# Patient Record
Sex: Male | Born: 1950 | Race: White | Hispanic: No | Marital: Married | Smoking: Former smoker
Health system: Southern US, Community
[De-identification: ages and names within clinical notes are randomized; demographics above are authoritative.]

## PROBLEM LIST (undated history)

## (undated) DIAGNOSIS — C801 Malignant (primary) neoplasm, unspecified: Secondary | ICD-10-CM

## (undated) DIAGNOSIS — I890 Lymphedema, not elsewhere classified: Secondary | ICD-10-CM

## (undated) DIAGNOSIS — I10 Essential (primary) hypertension: Secondary | ICD-10-CM

## (undated) DIAGNOSIS — F32A Depression, unspecified: Secondary | ICD-10-CM

## (undated) DIAGNOSIS — Z9221 Personal history of antineoplastic chemotherapy: Secondary | ICD-10-CM

## (undated) DIAGNOSIS — E46 Unspecified protein-calorie malnutrition: Secondary | ICD-10-CM

## (undated) DIAGNOSIS — IMO0001 Reserved for inherently not codable concepts without codable children: Secondary | ICD-10-CM

## (undated) DIAGNOSIS — Z8679 Personal history of other diseases of the circulatory system: Secondary | ICD-10-CM

## (undated) DIAGNOSIS — C099 Malignant neoplasm of tonsil, unspecified: Secondary | ICD-10-CM

## (undated) DIAGNOSIS — Z931 Gastrostomy status: Secondary | ICD-10-CM

## (undated) DIAGNOSIS — I82403 Acute embolism and thrombosis of unspecified deep veins of lower extremity, bilateral: Secondary | ICD-10-CM

## (undated) DIAGNOSIS — K219 Gastro-esophageal reflux disease without esophagitis: Secondary | ICD-10-CM

## (undated) DIAGNOSIS — F329 Major depressive disorder, single episode, unspecified: Secondary | ICD-10-CM

## (undated) DIAGNOSIS — IMO0002 Reserved for concepts with insufficient information to code with codable children: Secondary | ICD-10-CM

## (undated) DIAGNOSIS — Z923 Personal history of irradiation: Secondary | ICD-10-CM

## (undated) HISTORY — PX: TONSILLECTOMY: SUR1361

## (undated) HISTORY — PX: PORT-A-CATH REMOVAL: SHX5289

## (undated) HISTORY — PX: OTHER SURGICAL HISTORY: SHX169

## (undated) HISTORY — DX: Malignant neoplasm of tonsil, unspecified: C09.9

## (undated) HISTORY — DX: Gastro-esophageal reflux disease without esophagitis: K21.9

## (undated) HISTORY — DX: Unspecified protein-calorie malnutrition: E46

## (undated) HISTORY — PX: SPLENECTOMY: SUR1306

---

## 2009-04-01 HISTORY — PX: OTHER SURGICAL HISTORY: SHX169

## 2009-09-25 ENCOUNTER — Ambulatory Visit: Admission: RE | Admit: 2009-09-25 | Discharge: 2009-11-02 | Payer: Self-pay | Admitting: Radiation Oncology

## 2009-10-03 ENCOUNTER — Ambulatory Visit: Payer: Self-pay | Admitting: Dentistry

## 2009-10-03 ENCOUNTER — Encounter: Admission: AD | Admit: 2009-10-03 | Discharge: 2009-10-03 | Payer: Self-pay | Admitting: Dentistry

## 2009-10-04 ENCOUNTER — Ambulatory Visit: Payer: Self-pay | Admitting: Dentistry

## 2010-04-22 ENCOUNTER — Encounter: Payer: Self-pay | Admitting: Radiation Oncology

## 2011-08-05 ENCOUNTER — Ambulatory Visit (HOSPITAL_COMMUNITY)
Admission: RE | Admit: 2011-08-05 | Discharge: 2011-08-05 | Disposition: A | Discharge status: Home / Self Care | Payer: 59 | Source: Ambulatory Visit | Attending: Family Medicine | Admitting: Family Medicine

## 2011-08-05 ENCOUNTER — Other Ambulatory Visit: Payer: Self-pay | Admitting: Family Medicine

## 2011-08-05 DIAGNOSIS — R51 Headache: Secondary | ICD-10-CM

## 2011-08-05 DIAGNOSIS — H532 Diplopia: Secondary | ICD-10-CM | POA: Insufficient documentation

## 2011-08-05 DIAGNOSIS — J392 Other diseases of pharynx: Secondary | ICD-10-CM | POA: Insufficient documentation

## 2011-08-05 DIAGNOSIS — Z85819 Personal history of malignant neoplasm of unspecified site of lip, oral cavity, and pharynx: Secondary | ICD-10-CM | POA: Insufficient documentation

## 2011-08-06 ENCOUNTER — Emergency Department (HOSPITAL_COMMUNITY)
Admission: EM | Admit: 2011-08-06 | Discharge: 2011-08-06 | Disposition: A | Discharge status: Home / Self Care | Payer: 59 | Attending: Emergency Medicine | Admitting: Emergency Medicine

## 2011-08-06 ENCOUNTER — Emergency Department (HOSPITAL_COMMUNITY): Payer: 59

## 2011-08-06 DIAGNOSIS — R0602 Shortness of breath: Secondary | ICD-10-CM | POA: Insufficient documentation

## 2011-08-06 DIAGNOSIS — Z85819 Personal history of malignant neoplasm of unspecified site of lip, oral cavity, and pharynx: Secondary | ICD-10-CM | POA: Insufficient documentation

## 2011-08-06 DIAGNOSIS — H532 Diplopia: Secondary | ICD-10-CM | POA: Insufficient documentation

## 2011-08-06 DIAGNOSIS — R55 Syncope and collapse: Secondary | ICD-10-CM | POA: Insufficient documentation

## 2011-08-06 DIAGNOSIS — R220 Localized swelling, mass and lump, head: Secondary | ICD-10-CM | POA: Insufficient documentation

## 2011-08-06 DIAGNOSIS — R51 Headache: Secondary | ICD-10-CM | POA: Insufficient documentation

## 2011-08-06 LAB — CBC
HCT: 42.7 % (ref 39.0–52.0)
Hemoglobin: 15.4 g/dL (ref 13.0–17.0)
MCV: 89 fL (ref 78.0–100.0)
RBC: 4.8 MIL/uL (ref 4.22–5.81)
WBC: 22.2 10*3/uL — ABNORMAL HIGH (ref 4.0–10.5)

## 2011-08-06 LAB — COMPREHENSIVE METABOLIC PANEL
ALT: 21 U/L (ref 0–53)
CO2: 22 mEq/L (ref 19–32)
Calcium: 10 mg/dL (ref 8.4–10.5)
Chloride: 102 mEq/L (ref 96–112)
Creatinine, Ser: 1.29 mg/dL (ref 0.50–1.35)
GFR calc Af Amer: 69 mL/min — ABNORMAL LOW (ref >90)
GFR calc non Af Amer: 59 mL/min — ABNORMAL LOW (ref >90)
Glucose, Bld: 76 mg/dL (ref 70–99)
Total Bilirubin: 0.9 mg/dL (ref 0.3–1.2)

## 2011-08-06 LAB — DIFFERENTIAL
Eosinophils Relative: 0 % (ref 0–5)
Lymphocytes Relative: 7 % — ABNORMAL LOW (ref 12–46)
Lymphs Abs: 1.5 10*3/uL (ref 0.7–4.0)
Monocytes Absolute: 1.3 10*3/uL — ABNORMAL HIGH (ref 0.1–1.0)

## 2011-08-06 LAB — URINALYSIS, ROUTINE W REFLEX MICROSCOPIC
Bilirubin Urine: NEGATIVE
Nitrite: NEGATIVE
Protein, ur: 100 mg/dL — AB
Specific Gravity, Urine: 1.019 (ref 1.005–1.030)
Urobilinogen, UA: 1 mg/dL (ref 0.0–1.0)

## 2011-08-06 LAB — POCT I-STAT TROPONIN I

## 2011-08-06 LAB — URINE MICROSCOPIC-ADD ON

## 2011-08-06 MED ORDER — SODIUM CHLORIDE 0.9 % IV SOLN
1000.0000 mL | Freq: Once | INTRAVENOUS | Status: AC
  Administered 2011-08-06: 1000 mL via INTRAVENOUS

## 2011-08-06 MED ORDER — SODIUM CHLORIDE 0.9 % IV SOLN: 1000.0000 mL | INTRAVENOUS | Status: DC

## 2011-08-06 NOTE — Discharge Instructions (Signed)
Followup with your doctor in regards to your hospital visit. You may return to the emergency department if symptoms worsen, become progressive, or become more concerning. Keep your follow up appointment with your oncologist on May 14th.

## 2011-08-06 NOTE — ED Provider Notes (Signed)
History     CSN: [id number]  Arrival date & time 08/06/11  1535   First MD Initiated Contact with Patient 08/06/11 1540      Chief Complaint  Patient presents with  . Loss of Consciousness    (Consider location/radiation/quality/duration/timing/severity/associated sxs/prior treatment) HPI Comments: Patient with a history of tonsillar carcinoma, hypertension, and hyperlipidemia presents emergency Department with a chief complaint of syncopal event.  Patient states he was having some SOB,  difficulty breathing, and the feeling that he "could not clear his throat" while at work when he suddenly became diaphoretic, nauseous, and dizzy.  There was an episode of witnessed syncope lasting only seconds, but there was no head trauma. Pt had an MRI done on 5/6 for 3 days of double vision and 3 mo of HA (performed at [facility]). Results showed a mass of the posterior nasopharynx extending to the carotid artery w question for invasion at skull base. 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 Hezzie Bump at [facility] . Biopsy of lesion is is scheduled for May 14th. Pt has no current complaints and is asymptomatic art this time.    Patient is a 61 y.o. male presenting with syncope. The history is provided by the patient.  Loss of Consciousness Associated symptoms include headaches. Pertinent negatives include no weakness.    No past medical history on file.  No past surgical history on file.  No family history on file.  History  Substance Use Topics  . Smoking status: Not on file  . Smokeless tobacco: Not on file  . Alcohol Use: Not on file      Review of Systems  Eyes: Positive for visual disturbance (double vision).  Cardiovascular: Positive for syncope.  Neurological: Positive for dizziness, light-headedness and headaches. Negative for tremors, syncope, speech difficulty and weakness.  All other systems reviewed and are  negative.    Allergies  Review of patient's allergies indicates no known allergies.  Home Medications   Current Outpatient Rx  Name Route Sig Dispense Refill  . LISINOPRIL 40 MG PO TABS Oral Take 40 mg by mouth daily.      There were no vitals taken for this visit.  Physical Exam  Nursing note and vitals reviewed. Constitutional: He is oriented to person, place, and time. He appears well-developed and well-nourished. No distress.  HENT:  Head: Normocephalic and atraumatic.  Eyes: Conjunctivae and EOM are normal. Pupils are equal, round, and reactive to light. No scleral icterus.  Neck: Normal range of motion and full passive range of motion without pain. Neck supple. No JVD present. Carotid bruit is not present. No rigidity. No Brudzinski's sign noted.  Cardiovascular: Normal rate, regular rhythm, normal heart sounds and intact distal pulses.   Pulmonary/Chest: Effort normal and breath sounds normal. No respiratory distress. He has no wheezes. He has no rales.  Musculoskeletal: Normal range of motion.  Lymphadenopathy:    He has no cervical adenopathy.  Neurological: He is alert and oriented to person, place, and time. He has normal strength. No cranial nerve deficit or sensory deficit. He displays a negative Romberg sign. Coordination and gait normal. GCS eye subscore is 4. GCS verbal subscore is 5. GCS motor subscore is 6.       A&O x3.  Able to follow commands. EOMs, no vertical  nystagmus. Shoulder shrug, facial muscles, tongue protrusion and swallow intact.  Motor strength 5/5 bilaterally including grip strength, triceps, hamstrings and ankle dorsiflexion.  Light touch intact  in all 4 distal limbs.  Intact finger to nose, shin to heel and rapid alternating movements. No ataxia or dysequilibrium.   Skin: Skin is warm and dry. No rash noted. He is not diaphoretic.  Psychiatric: He has a normal mood and affect. His behavior is normal.    ED Course  Procedures (including critical  care time)  Labs Reviewed - No data to display Dg Eye Foreign Body  08/05/2011  *RADIOLOGY REPORT*  Clinical Data: History of metal within eyes, MRI clearance  ORBITS FOR FOREIGN BODY - 2 VIEW  Comparison: None.  Findings: No orbital metallic foreign body. Nasal septal deviation to the right. Visualized bones and sinuses otherwise unremarkable.  IMPRESSION: No orbital metallic foreign body identified.  Original Report Authenticated By: Lollie Marrow, M.D.   Mr Brain Wo Contrast  08/05/2011  **ADDENDUM** CREATED: 08/05/2011 13:09:56  I discussed the findings with on CJ, PA  on 08/05/2011 at 1310 hours.  Additional history includes a history of   tonsillar carcinoma.  This may represent recurrent carcinoma.  Further imaging evaluation is recommended as described in the original report.  **END ADDENDUM** SIGNED BY: Dineen Kid. Chestine Spore, M.D.    08/05/2011  *RADIOLOGY REPORT*  Clinical Data: Sinus pressure and headache.  Double vision  MRI HEAD WITHOUT CONTRAST  Technique:  Multiplanar, multiecho pulse sequences of the brain and surrounding structures were obtained according to standard protocol without intravenous contrast.  Comparison: None.  Findings: Ventricle size is normal.  Mild cerebral atrophy is present.  Mild patchy hyperintensity in the frontal white matter bilaterally appears chronic and is most likely due to chronic ischemia.  Negative for acute infarct.  No intracranial hemorrhage or fluid collection.  Negative for intracranial mass lesion.  Soft tissue mass is present in the left  posterior nasopharynx, only partially evaluated on this study.  This mass measures approximately 2.1 x 3.9 cm and extends caudally around the left carotid artery which appears to remain patent.  There is also invasion of the left   longus coli muscle. There is possible invasion of the posterior left cavernous sinus and possible invasion of the clivus on the left.  There is a small mastoid sinus effusion on the left compatible with  eustachian tube obstruction.  IMPRESSION: Mass lesion in the left posterior nasopharynx extending caudally along the carotid artery.  This is most compatible with nasopharyngeal carcinoma.  There is concern about perineural invasion extending into the left cavernous sinus which may be causing the patient's double vision.  There is also concern about invasion of the skull base.  Further imaging evaluation is suggested.  I would recommend MRI of the skull base without and with contrast to evaluate for tumor extension and perineural tumor.  MRI neck without with contrast also suggested to evaluate for cervical adenopathy.  Original Report Authenticated By: Camelia Phenes, M.D.     No diagnosis found.   Date: 08/06/2011  Rate: 62  Rhythm: normal sinus rhythm  QRS Axis: normal  Intervals: normal  ST/T Wave abnormalities: normal  Conduction Disutrbances: none  Narrative Interpretation:   Old EKG Reviewed: No comparison  6:13 PM  As long as not bleeding and breathing OK Dr. Hezzie Bump does not believe syncopal episode to be related to mass.    MDM  Syncope question vasovagal   Patient has been advised to keep his followup appointment with Dr.Waltonen, and to see him sooner if difficulty breathing occurs.  Patient is aware of lab results as is his  oncologist. Patient case discussed with Dr. Jeraldine Loots who agrees with plan to dc. Pt rehydrated in the ED and has no signs of infection including being asymptomatic with VSS. Patient does not want to be admitted and will follow up with his PCP [facility]. Pt appears to be reliable for follow up.         Jaci Carrel, [place] 08/06/11 1932

## 2011-08-06 NOTE — ED Notes (Signed)
Per ems- pt had a synopal episode while at work. Pt became hot and "passed out" coworkers helped pt to floor. Pt was diaphoretic and SOB when EMS arrived. BP after receiving fluids 110/65. HR 55. IV 20 Rt forearm with approx of NS en route. CBG 83.

## 2011-08-06 NOTE — ED Notes (Signed)
Pt given urinal and encouraged to give urine sample

## 2011-08-07 NOTE — ED Provider Notes (Signed)
Medical screening examination/treatment/procedure(s) were conducted as a shared visit with non-physician practitioner(s) and myself.  I personally evaluated the patient during the encounter On my exam this patient who presented to do a syncopal event was in no distress, sitting upright, with no focal complaints.  Notably, the patient has a significant history of cancer, which is being evaluated for a recurrence.  A very recent MRI demonstrates progression of his disease.  The patient's emergency department evaluation was largely unremarkable.  We discussed patient's case with his oncologist at [facility].  Given the absence of focal findings here, the description of the event consistent with vagal syncope, and the patient's needed ongoing followup for oncologic therapy, he was discharged in stable condition to followup as soon as possible.  On my exam the patient had a sinus rhythm of 60 which is normal, and his pulse oximetry was 99% on room air which is normal.  I saw the ECG and agree with the interpretation.  Gerhard Munch, MD 08/07/11 [phone]

## 2011-08-28 ENCOUNTER — Emergency Department (HOSPITAL_COMMUNITY): Payer: 59

## 2011-08-28 ENCOUNTER — Other Ambulatory Visit: Payer: Self-pay | Admitting: Radiation Oncology

## 2011-08-28 ENCOUNTER — Encounter (HOSPITAL_COMMUNITY): Payer: Self-pay

## 2011-08-28 ENCOUNTER — Observation Stay (HOSPITAL_COMMUNITY)
Admission: EM | Admit: 2011-08-28 | Discharge: 2011-08-30 | Disposition: A | Discharge status: Home / Self Care | Payer: 59 | Attending: Internal Medicine | Admitting: Internal Medicine

## 2011-08-28 DIAGNOSIS — C119 Malignant neoplasm of nasopharynx, unspecified: Secondary | ICD-10-CM

## 2011-08-28 DIAGNOSIS — R55 Syncope and collapse: Secondary | ICD-10-CM

## 2011-08-28 DIAGNOSIS — I959 Hypotension, unspecified: Secondary | ICD-10-CM | POA: Insufficient documentation

## 2011-08-28 DIAGNOSIS — D72829 Elevated white blood cell count, unspecified: Secondary | ICD-10-CM | POA: Insufficient documentation

## 2011-08-28 DIAGNOSIS — R0602 Shortness of breath: Secondary | ICD-10-CM | POA: Insufficient documentation

## 2011-08-28 DIAGNOSIS — I10 Essential (primary) hypertension: Secondary | ICD-10-CM | POA: Insufficient documentation

## 2011-08-28 DIAGNOSIS — N289 Disorder of kidney and ureter, unspecified: Secondary | ICD-10-CM | POA: Insufficient documentation

## 2011-08-28 DIAGNOSIS — I824Z9 Acute embolism and thrombosis of unspecified deep veins of unspecified distal lower extremity: Secondary | ICD-10-CM | POA: Insufficient documentation

## 2011-08-28 DIAGNOSIS — I824Y9 Acute embolism and thrombosis of unspecified deep veins of unspecified proximal lower extremity: Secondary | ICD-10-CM | POA: Insufficient documentation

## 2011-08-28 DIAGNOSIS — Z9081 Acquired absence of spleen: Secondary | ICD-10-CM

## 2011-08-28 HISTORY — DX: Malignant (primary) neoplasm, unspecified: C80.1

## 2011-08-28 HISTORY — DX: Essential (primary) hypertension: I10

## 2011-08-28 LAB — POCT I-STAT, CHEM 8
Creatinine, Ser: 1.5 mg/dL — ABNORMAL HIGH (ref 0.50–1.35)
Hemoglobin: 15.6 g/dL (ref 13.0–17.0)
Sodium: 139 mEq/L (ref 135–145)
TCO2: 22 mmol/L (ref 0–100)

## 2011-08-28 LAB — DIFFERENTIAL
Basophils Absolute: 0.1 10*3/uL (ref 0.0–0.1)
Basophils Relative: 1 % (ref 0–1)
Lymphocytes Relative: 10 % — ABNORMAL LOW (ref 12–46)
Neutro Abs: 14.4 10*3/uL — ABNORMAL HIGH (ref 1.7–7.7)
Neutrophils Relative %: 85 % — ABNORMAL HIGH (ref 43–77)

## 2011-08-28 LAB — CBC
HCT: 43.6 % (ref 39.0–52.0)
MCHC: 36 g/dL (ref 30.0–36.0)
Platelets: 309 10*3/uL (ref 150–400)
RDW: 13 % (ref 11.5–15.5)
WBC: 16.9 10*3/uL — ABNORMAL HIGH (ref 4.0–10.5)

## 2011-08-28 LAB — CARDIAC PANEL(CRET KIN+CKTOT+MB+TROPI)
CK, MB: 1.5 ng/mL (ref 0.3–4.0)
CK, MB: 1.6 ng/mL (ref 0.3–4.0)
Relative Index: INVALID (ref 0.0–2.5)
Relative Index: INVALID (ref 0.0–2.5)
Troponin I: <0.3 ng/mL (ref <0.30)

## 2011-08-28 LAB — URINALYSIS, ROUTINE W REFLEX MICROSCOPIC
Bilirubin Urine: NEGATIVE
Hgb urine dipstick: NEGATIVE
Specific Gravity, Urine: 1.009 (ref 1.005–1.030)
Urobilinogen, UA: 0.2 mg/dL (ref 0.0–1.0)

## 2011-08-28 LAB — RAPID URINE DRUG SCREEN, HOSP PERFORMED
Amphetamines: NOT DETECTED
Cocaine: NOT DETECTED
Opiates: NOT DETECTED
Tetrahydrocannabinol: NOT DETECTED

## 2011-08-28 LAB — POCT I-STAT TROPONIN I: Troponin i, poc: 0 ng/mL (ref 0.00–0.08)

## 2011-08-28 MED ORDER — SODIUM CHLORIDE 0.9 % IV SOLN
INTRAVENOUS | Status: DC
  Administered 2011-08-28 – 2011-08-29 (×3): via INTRAVENOUS

## 2011-08-28 MED ORDER — SODIUM CHLORIDE 0.9 % IV SOLN: INTRAVENOUS | Status: DC

## 2011-08-28 MED ORDER — HYDROCODONE-ACETAMINOPHEN 7.5-325 MG PO TABS: 1.0000 | ORAL_TABLET | Freq: Four times a day (QID) | ORAL | Status: DC | PRN

## 2011-08-28 MED ORDER — HYDROCODONE-ACETAMINOPHEN 5-325 MG PO TABS
1.5000 | ORAL_TABLET | Freq: Four times a day (QID) | ORAL | Status: DC | PRN
  Administered 2011-08-28 – 2011-08-29 (×2): 1.5 via ORAL
  Filled 2011-08-28 (×2): qty 2

## 2011-08-28 MED ORDER — DIPHENHYDRAMINE HCL 25 MG PO TABS
25.0000 mg | ORAL_TABLET | Freq: Four times a day (QID) | ORAL | Status: DC | PRN
  Administered 2011-08-29 – 2011-08-30 (×2): 25 mg via ORAL
  Filled 2011-08-28 (×3): qty 1

## 2011-08-28 NOTE — ED Notes (Addendum)
Attempt to call report to [phone],  Unable to give report due to no orders.

## 2011-08-28 NOTE — ED Notes (Signed)
Called to pt room. Pt reports that he was not feeling well again. Bp noted to be65/31. Provider to the bedside. Second line started with bolus hanging at this time.

## 2011-08-28 NOTE — ED Provider Notes (Signed)
History     CSN: [id number]  Arrival date & time 08/28/11  1551   First MD Initiated Contact with Patient 08/28/11 1552      Chief Complaint  Patient presents with  . 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  He came to about several minutes later.  EMS arrived and they report that the patient was pale and diaphoretic when they got there and complaining of shortness of breath.  Once they loaded him on the stretcher, he began to complain of feeling hot and flushed, he was noted to be quite hypotensive in the 80's.  Upon arrival here he reports feeling continued generalized weakness, and shortness of breath but denies headache, blurred vision, neck pain, fever, chills, chest pain, abdominal pain, nausea, vomiting or diarrhea.  He has a history of throat cancer for which he is just beginning his work up at [employer].  He has a history of previous syncopal episode here on 8 MAY for which he was not admitted and was thought to be vasovagal in nature.  Patient is a 61 y.o. male presenting with syncope. The history is provided by the patient and the spouse. No language interpreter was used.  Loss of Consciousness This is a new problem. The current episode started today. The problem occurs intermittently. The problem has been gradually improving. Associated symptoms include chest pain, chills, diaphoresis, nausea and weakness. Pertinent negatives include no abdominal pain, anorexia, arthralgias, change in bowel habit, congestion, coughing, fatigue, fever, headaches, joint swelling, myalgias, neck  pain, numbness, rash, sore throat, swollen glands, urinary symptoms, vertigo, visual change or vomiting. The symptoms are aggravated by nothing. He has tried lying down for the symptoms. The treatment provided no relief.    Past Medical History  Diagnosis Date  . Cancer   . Hypertension     No past surgical history on file.  No family history on file.  History  Substance Use Topics  . Smoking status: Never Smoker   . Smokeless tobacco: Not on file  . Alcohol Use: No      Review of Systems  Constitutional: Positive for chills and diaphoresis. Negative for fever and fatigue.  HENT: Negative for congestion, sore throat and neck pain.   Respiratory: Negative for cough.   Cardiovascular: Positive for chest pain and syncope.  Gastrointestinal: Positive for nausea. Negative for vomiting, abdominal pain, anorexia and change in bowel habit.  Musculoskeletal: Negative for myalgias, joint swelling and arthralgias.  Skin: Negative for rash.  Neurological: Positive for weakness. Negative for vertigo, numbness and headaches.  All other systems reviewed and are negative.    Allergies  Morphine and related  Home Medications   Current Outpatient Rx  Name Route Sig Dispense Refill  . CHLORPHENIRAMINE MALEATE 4 MG PO TABS Oral Take 4 mg by mouth every 6 (six) hours as needed. For congestion \    . HYDROCODONE-ACETAMINOPHEN 7.5-325 MG PO TABS Oral Take 1 tablet by mouth every 6 (six) hours as needed. For pain    . LISINOPRIL 5 MG PO TABS Oral Take 5 mg by mouth daily.  BP 159/101  Pulse 70  Temp(Src) 97.6 F (36.4 C) (Oral)  Resp 18  SpO2 100%  Physical Exam  Nursing note and vitals reviewed. Constitutional: He is oriented to person, place, and time. He appears well-developed and well-nourished. No distress.  HENT:  Head: Normocephalic and atraumatic.  Right Ear: External ear normal.  Left Ear: External ear normal.  Nose: Nose normal.  Mouth/Throat: Oropharynx is clear  and moist. No oropharyngeal exudate.  Eyes: Conjunctivae are normal. Pupils are equal, round, and reactive to light. No scleral icterus.  Neck: Normal range of motion. Neck supple. No JVD present.  Cardiovascular: Normal rate, regular rhythm and normal heart sounds.  Exam reveals no gallop and no friction rub.   No murmur heard. Pulmonary/Chest: Effort normal and breath sounds normal. No respiratory distress. He has no wheezes. He has no rales. He exhibits no tenderness.  Abdominal: Soft. Bowel sounds are normal. He exhibits no distension. There is no tenderness. There is no rebound and no guarding.  Musculoskeletal: Normal range of motion. He exhibits no edema and no tenderness.  Neurological: He is alert and oriented to person, place, and time. No cranial nerve deficit. He exhibits normal muscle tone. Coordination normal.  Skin: Skin is warm and dry. No rash noted. No erythema. There is pallor.  Psychiatric: He has a normal mood and affect. His behavior is normal. Judgment and thought content normal.    ED Course  Procedures (including critical care time)  Labs Reviewed  CBC - Abnormal; Notable for the following:    WBC 16.9 (*)    All other components within normal limits  DIFFERENTIAL - Abnormal; Notable for the following:    Neutrophils Relative 85 (*)    Neutro Abs 14.4 (*)    Lymphocytes Relative 10 (*)    All other components within normal limits   No results found.   Date: 08/28/2011  Rate: 62  Rhythm: normal sinus rhythm  QRS Axis: normal  Intervals: normal  ST/T Wave abnormalities: normal  Conduction Disutrbances:none  Narrative Interpretation: Reviewed by Dr. Ignacia Palma  Old EKG Reviewed: unchanged   Syncope    MDM  4:43 PM Patient has had another episode of feeling like he would pass out - pale and diaphretic - blood sugar 139, noted to be quite hypotensive with SBP of 69, HR 60, have asked a second line to be started and run in another liter of  fluids.   6:09 PM' I have spoken with [facility] medicine teaching service who will be admitting the patient - he currently feels much better with normal blood pressure of 122/73, plan to admit to Teaching Service    Scarlette Calico C. [place], [place] 08/28/11 1811

## 2011-08-28 NOTE — ED Notes (Signed)
PER EMS: patient cutting the grass outside, came inside feeling weak after taking a shower, patient family reporting syncope, patient pale, diaphoretic upon arrival.  Hypotensive in 80's. Alert and oriented x 4. 750cc given PTA

## 2011-08-28 NOTE — ED Provider Notes (Signed)
4:12 PM  Date: 08/28/2011  Rate: 62  Rhythm: normal sinus rhythm  QRS Axis: normal  Intervals: normal  ST/T Wave abnormalities: normal  Conduction Disutrbances:none  Narrative Interpretation: Normal EKG  Old EKG Reviewed: unchanged    Carleene Cooper III, MD 08/28/11 [phone]

## 2011-08-28 NOTE — H&P (Addendum)
Shane Marsh is an 61 y.o. male.   PCP - Dr.Donald Christell Constant. Chief Complaint: Loss of consciousness. 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 Patient today after working in his yard in his house had a shower and after that he felt uncomfortable and became short of breath he told his wife who was there that he is not feeling well. Patient subsequent lost consciousness for a few seconds and regained immediately. His wife was somewhat called EMS but he told not to. A minute later he again lost consciousness and that time his wife called EMS. His wife was instructed to give chest compressions as she was not sure if patient had a pulse. The wife states she gave at least 2 compression after that she gave couple of breaths through the mouth. After which patient became awake. The whole incident may have lasted 2-3 minutes. During the episode patient was very diaphoretic. By then the EMS reached. He was found to be hypotensive and he also was found to be hypotensive in the ER. His blood pressure improved with fluids. Patient has been admitted for further workup. Patient had a similar episode 3 weeks ago. Patient also has diplopia which he had since early this month and he had an MRI of the brain on Aug 05, 2011 which led to the diagnosis of the nasopharyngeal tumor and subsequently underwent biopsy at [facility] and is planning to get radiation therapy later. He had also has left periorbital pain for which he has been prescribed hydrocodone by his oncologist. He also takes Benadryl when necessary for nasal congestion.  Past Medical History  Diagnosis Date  . Cancer   . Hypertension     Past Surgical History  Procedure Date  . Splectomy     History reviewed. No pertinent family history. Social History:  reports that  he has never smoked. He does not have any smokeless tobacco history on file. He reports that he does not drink alcohol. His drug history not on file.  Allergies:  Allergies  Allergen Reactions  . Morphine And Related     Medications Prior to Admission  Medication Sig Dispense Refill  . chlorpheniramine (CHLOR-TRIMETON) 4 MG tablet Take 4 mg by mouth every 6 (six) hours as needed. For congestion \      . HYDROcodone-acetaminophen (NORCO) 7.5-325 MG per tablet Take 1 tablet by mouth every 6 (six) hours as needed. For pain      . lisinopril (PRINIVIL,ZESTRIL) 5 MG tablet Take 5 mg by mouth daily.        Results for orders placed during the hospital encounter of 08/28/11 (from the past 48 hour(s))  CBC     Status: Abnormal   Collection Time   08/28/11  4:13 PM      Component Value Range Comment   WBC 16.9 (*) 4.0 - 10.5 (K/uL)    RBC 4.94  4.22 - 5.81 (MIL/uL)    Hemoglobin 15.7  13.0 - 17.0 (g/dL)    HCT 16.1  09.6 - 04.5 (%)    MCV 88.3  78.0 - 100.0 (fL)    MCH 31.8  26.0 - 34.0 (pg)    MCHC 36.0  30.0 - 36.0 (g/dL)    RDW 40.9  81.1 - 91.4 (%)    Platelets 309  150 - 400 (K/uL)  DIFFERENTIAL     Status: Abnormal   Collection Time   08/28/11  4:13 PM      Component Value Range Comment   Neutrophils Relative 85 (*) 43 - 77 (%)    Neutro Abs 14.4 (*) 1.7 - 7.7 (K/uL)    Lymphocytes Relative 10 (*) 12 - 46 (%)    Lymphs Abs 1.7  0.7 - 4.0 (K/uL)    Monocytes Relative 5  3 - 12 (%)    Monocytes Absolute 0.8  0.1 - 1.0 (K/uL)    Eosinophils Relative 0  0 - 5 (%)    Eosinophils Absolute 0.0  0.0 - 0.7 (K/uL)    Basophils Relative 1  0 - 1 (%)    Basophils Absolute 0.1  0.0 - 0.1 (K/uL)   POCT I-STAT TROPONIN I     Status: Normal   Collection Time   08/28/11  4:24 PM      Component Value Range Comment   Troponin i, poc 0.00  0.00 - 0.08 (ng/mL)    Comment 3            POCT I-STAT, CHEM 8     Status: Abnormal   Collection Time   08/28/11  4:26 PM      Component Value Range  Comment   Sodium 139  135 - 145 (mEq/L)    Potassium 5.1  3.5 - 5.1 (mEq/L)    Chloride 107  96 - 112 (mEq/L)    BUN 18  6 - 23 (mg/dL)    Creatinine, Ser 1.61 (*) 0.50 - 1.35 (mg/dL)    Glucose, Bld 096 (*) 70 - 99 (mg/dL)    Calcium, Ion 0.45  1.12 - 1.32 (mmol/L)    TCO2 22  0 - 100 (mmol/L)    Hemoglobin 15.6  13.0 - 17.0 (g/dL)    HCT 40.9  81.1 - 91.4 (%)   GLUCOSE, CAPILLARY     Status: Abnormal   Collection Time   08/28/11  4:33 PM      Component Value Range Comment   Glucose-Capillary 139 (*) 70 - 99 (mg/dL)   URINALYSIS, ROUTINE W REFLEX MICROSCOPIC     Status: Abnormal   Collection Time   08/28/11  6:21 PM      Component Value Range Comment   Color, Urine YELLOW  YELLOW     APPearance HAZY (*) CLEAR     Specific Gravity, Urine 1.009  1.005 - 1.030     pH 7.5  5.0 - 8.0     Glucose, UA NEGATIVE  NEGATIVE (mg/dL)    Hgb urine dipstick NEGATIVE  NEGATIVE     Bilirubin Urine NEGATIVE  NEGATIVE     Ketones, ur NEGATIVE  NEGATIVE (mg/dL)    Protein, ur NEGATIVE  NEGATIVE (mg/dL)    Urobilinogen, UA 0.2  0.0 - 1.0 (mg/dL)    Nitrite NEGATIVE  NEGATIVE     Leukocytes, UA NEGATIVE  NEGATIVE  MICROSCOPIC NOT DONE ON URINES WITH NEGATIVE PROTEIN, BLOOD, LEUKOCYTES, NITRITE, OR GLUCOSE <1000 mg/dL.  CARDIAC PANEL(CRET KIN+CKTOT+MB+TROPI)     Status: Normal   Collection Time   08/28/11  7:29 PM      Component Value Range Comment   Total CK 46  7 - 232 (U/L)    CK, MB 1.5  0.3 - 4.0 (ng/mL)    Troponin I <0.30  <0.30 (ng/mL)    Relative Index RELATIVE INDEX IS INVALID  0.0 - 2.5     Dg Chest  Portable 1 View  08/28/2011  *RADIOLOGY REPORT*  Clinical Data: Hypotension.  PORTABLE CHEST - 1 VIEW  Comparison: Chest 08/06/2011.  Findings: Lungs are clear.  Heart size is normal.  No pneumothorax or pleural fluid.  Surgical clips base of the left neck noted.  IMPRESSION: No acute finding.  Original Report Authenticated By: Bernadene Bell. Maricela Curet, M.D.    Review of Systems    Constitutional: Negative.   HENT: Negative.   Eyes: Negative.   Respiratory: Positive for shortness of breath.   Cardiovascular: Negative.   Gastrointestinal: Negative.   Genitourinary: Negative.   Musculoskeletal: Negative.   Skin: Negative.   Neurological: Positive for loss of consciousness.  Endo/Heme/Allergies: Negative.   Psychiatric/Behavioral: Negative.     Blood pressure 113/71, pulse 70, temperature 98.3 F (36.8 C), temperature source Oral, resp. rate 18, height 5\' 8"  (1.727 m), weight 82.6 kg (182 lb 1.6 oz), SpO2 99.00%. Physical Exam  Constitutional: He is oriented to person, place, and time. He appears well-developed and well-nourished. No distress.  HENT:  Head: Normocephalic and atraumatic.  Right Ear: External ear normal.  Left Ear: External ear normal.  Nose: Nose normal.  Mouth/Throat: Oropharynx is clear and moist. No oropharyngeal exudate.  Eyes: Conjunctivae are normal. Pupils are equal, round, and reactive to light. Right eye exhibits no discharge. Left eye exhibits no discharge. No scleral icterus.       Cannot laterally abduct his left eye.  Neck: Normal range of motion. Neck supple.  Cardiovascular: Normal rate, regular rhythm and normal heart sounds.   Respiratory: Effort normal and breath sounds normal. No respiratory distress. He has no wheezes. He has no rales.  GI: Soft. Bowel sounds are normal. He exhibits no distension. There is no tenderness. There is no rebound.  Musculoskeletal: Normal range of motion. He exhibits no edema and no tenderness.  Neurological: He is alert and oriented to person, place, and time.       Tongue is deviated to left. Moves all extremities.  Skin: Skin is warm and dry. No rash noted. He is not diaphoretic. No erythema.  Psychiatric: His behavior is normal.     Assessment/Plan #1. Syncope - cause is not clear. This is the second episode with the last 4 weeks. Patient was found initially hypotensive. And also was short  of breath before the syncope. We will get VQ scan(CT angiogram not possible due to increased creatinine) to rule out PE. Check 2-D echo cycle cardiac markers. Get EEG and MRI brain and carotid Doppler. Continue hydration. #2. Mild renal insufficiency - hold lisinopril because of increased creatinine and also being initially hypotensive. Continue hydration and recheck metabolic panel in a.m. #3. Recently diagnosed nasopharyngeal carcinoma. #4. 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 Follow CBC.  CODE STATUS - full code.  Eduard Clos 08/28/2011, 9:23 PM

## 2011-08-28 NOTE — ED Notes (Signed)
Admitting MD here to see pt at this time

## 2011-08-28 NOTE — ED Notes (Signed)
Pt is in no distress at this time.  Family member is at the bedside.  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  Nothing new, no focal weakness with this.  No dizziness

## 2011-08-28 NOTE — ED Notes (Signed)
Pt failed swallow screen.   Coughing noted after water from straw.  Pt NPO, notified him of this.  Swallow evaluation ordered

## 2011-08-29 ENCOUNTER — Observation Stay (HOSPITAL_COMMUNITY): Payer: 59

## 2011-08-29 ENCOUNTER — Telehealth (HOSPITAL_COMMUNITY): Payer: Self-pay | Admitting: Dentistry

## 2011-08-29 DIAGNOSIS — G459 Transient cerebral ischemic attack, unspecified: Secondary | ICD-10-CM

## 2011-08-29 DIAGNOSIS — C119 Malignant neoplasm of nasopharynx, unspecified: Secondary | ICD-10-CM

## 2011-08-29 DIAGNOSIS — R569 Unspecified convulsions: Secondary | ICD-10-CM

## 2011-08-29 DIAGNOSIS — R55 Syncope and collapse: Secondary | ICD-10-CM

## 2011-08-29 LAB — LIPID PANEL
HDL: 34 mg/dL — ABNORMAL LOW (ref >39)
LDL Cholesterol: 166 mg/dL — ABNORMAL HIGH (ref 0–99)
VLDL: 29 mg/dL (ref 0–40)

## 2011-08-29 LAB — COMPREHENSIVE METABOLIC PANEL
ALT: 12 U/L (ref 0–53)
AST: 14 U/L (ref 0–37)
Albumin: 3.1 g/dL — ABNORMAL LOW (ref 3.5–5.2)
Calcium: 8.8 mg/dL (ref 8.4–10.5)
Creatinine, Ser: 0.82 mg/dL (ref 0.50–1.35)
Sodium: 139 mEq/L (ref 135–145)
Total Protein: 5.8 g/dL — ABNORMAL LOW (ref 6.0–8.3)

## 2011-08-29 LAB — CBC
MCH: 31.8 pg (ref 26.0–34.0)
MCHC: 35.4 g/dL (ref 30.0–36.0)
MCV: 89.9 fL (ref 78.0–100.0)
Platelets: 259 10*3/uL (ref 150–400)
RBC: 4.34 MIL/uL (ref 4.22–5.81)
RDW: 13.1 % (ref 11.5–15.5)

## 2011-08-29 LAB — GLUCOSE, CAPILLARY
Glucose-Capillary: 91 mg/dL (ref 70–99)
Glucose-Capillary: 114 mg/dL — ABNORMAL HIGH (ref 70–99)
Glucose-Capillary: 111 mg/dL — ABNORMAL HIGH (ref 70–99)

## 2011-08-29 LAB — PROTIME-INR
INR: 0.94 (ref 0.00–1.49)
Prothrombin Time: 12.8 seconds (ref 11.6–15.2)

## 2011-08-29 LAB — CARDIAC PANEL(CRET KIN+CKTOT+MB+TROPI)
CK, MB: 1.5 ng/mL (ref 0.3–4.0)
Relative Index: INVALID (ref 0.0–2.5)
Total CK: 41 U/L (ref 7–232)
Total CK: 50 U/L (ref 7–232)

## 2011-08-29 LAB — HEMOGLOBIN A1C: Mean Plasma Glucose: 123 mg/dL — ABNORMAL HIGH (ref <117)

## 2011-08-29 MED ORDER — WARFARIN VIDEO
Freq: Once | Status: AC
  Administered 2011-08-30: 10:00:00

## 2011-08-29 MED ORDER — ENOXAPARIN SODIUM 80 MG/0.8ML ~~LOC~~ SOLN
80.0000 mg | Freq: Two times a day (BID) | SUBCUTANEOUS | Status: DC
  Administered 2011-08-30 (×2): 80 mg via SUBCUTANEOUS
  Filled 2011-08-29 (×3): qty 0.8

## 2011-08-29 MED ORDER — WARFARIN - PHARMACIST DOSING INPATIENT
Freq: Every day | Status: DC
  Administered 2011-08-30: 18:00:00

## 2011-08-29 MED ORDER — PATIENT'S GUIDE TO USING COUMADIN BOOK
Freq: Once | Status: AC
  Administered 2011-08-29: 18:00:00
  Filled 2011-08-29: qty 1

## 2011-08-29 MED ORDER — TECHNETIUM TO 99M ALBUMIN AGGREGATED
3.0000 | Freq: Once | INTRAVENOUS | Status: AC | PRN
  Administered 2011-08-29: 3 via INTRAVENOUS

## 2011-08-29 MED ORDER — WARFARIN SODIUM 7.5 MG PO TABS
7.5000 mg | ORAL_TABLET | Freq: Once | ORAL | Status: AC
  Administered 2011-08-29: 7.5 mg via ORAL
  Filled 2011-08-29: qty 1

## 2011-08-29 MED ORDER — ENOXAPARIN SODIUM 80 MG/0.8ML ~~LOC~~ SOLN
80.0000 mg | Freq: Once | SUBCUTANEOUS | Status: AC
  Administered 2011-08-29: 80 mg via SUBCUTANEOUS
  Filled 2011-08-29: qty 0.8

## 2011-08-29 NOTE — Procedures (Signed)
EEG NUMBER:  [id number].  This routine EEG was requested in this 61 year old man who has had a recent diagnosis of nasopharyngeal carcinoma who was presented with sudden loss of consciousness.  MEDICATIONS:  Include hydrocodone.  The EEG was done with the patient awake, drowsy, and asleep.  During periods of maximal wakefulness, he had a well-regulated, well-sustained 9-cycle per second posterior dominant rhythm that attenuated with eye opening and was symmetric.  Background activities were composed of low amplitude alpha and beta activities with the base activities being frontally dominant.  Photic stimulation produced a driving response that was slightly higher over the left hemisphere.  Hyperventilation resulted in a slight increase in the amplitude of background activities.  The patient did fall asleep, marked by attenuation of the alpha rhythm and enhancement of the frontally dominant beta activities and bursts of slower delta and theta activities.  Vertex sharp waves and symmetric sleep spindles were also seen.  CLINICAL INTERPRETATION:  This routine EEG done with the patient awake, drowsy, and asleep is normal.          ______________________________ Denton Meek, MD    [id number] D:  08/29/2011 13:05:07  T:  08/29/2011 13:23:25  Job #:  [id number]

## 2011-08-29 NOTE — Progress Notes (Signed)
ANTICOAGULATION CONSULT NOTE - Initial Consult  Pharmacy Consult for lovenox>>warfarin Indication: bilateral DVTs/rule out PE  Allergies  Allergen Reactions  . Morphine And Related     Patient Measurements: Height: 5\' 8"  (172.7 cm) Weight: 182 lb 12.2 oz (82.9 kg) (a scale) IBW/kg (Calculated) : 68.4    Vital Signs: Temp: 98.8 F (37.1 C) (05/30 1348) Temp src: Oral (05/30 1348) BP: 128/73 mmHg (05/30 1348) Pulse Rate: 71  (05/30 1348)  Labs:  Alvira Philips 08/29/11 0520 08/29/11 0518 08/28/11 2127 08/28/11 1929 08/28/11 1626 08/28/11 1613  HGB 13.8 -- -- -- 15.6 --  HCT 39.0 -- -- -- 46.0 43.6  PLT 259 -- -- -- -- 309  APTT -- -- -- -- -- --  LABPROT -- -- -- -- -- --  INR -- -- -- -- -- --  HEPARINUNFRC -- -- -- -- -- --  CREATININE 0.82 -- -- -- 1.50* --  CKTOTAL -- 41 45 46 -- --  CKMB -- 1.5 1.6 1.5 -- --  TROPONINI -- <0.30 <0.30 <0.30 -- --    Estimated Creatinine Clearance: 101.8 ml/min (by C-G formula based on Cr of 0.82).   Medical History: Past Medical History  Diagnosis Date  . Cancer   . Hypertension     Medications:  Prescriptions prior to admission  Medication Sig Dispense Refill  . chlorpheniramine (CHLOR-TRIMETON) 4 MG tablet Take 4 mg by mouth every 6 (six) hours as needed. For congestion \      . HYDROcodone-acetaminophen (NORCO) 7.5-325 MG per tablet Take 1 tablet by mouth every 6 (six) hours as needed. For pain      . lisinopril (PRINIVIL,ZESTRIL) 5 MG tablet Take 5 mg by mouth daily.        Assessment: 61 year old male with recently diagnosed nasopharyngeal carcinoma and previous tonsillar carcinoma admitted after syncopal episode at home. Patient is currently undergoing VQ scan work up for possible PE. Dopplers were positive for bilateral DVTs. Will start Lovenox and warfarin therapy now - results of VQ scan will not change therapy. Patient has a calculated warfarin score of 7 based on age and weight.  Patient's original scr was 1.5 but  has since improved to a normal 0.8 this morning, so will proceed with Q12h dosing of Lovenox.  Goal of Therapy:  INR 2-3 Monitor platelets by anticoagulation protocol: Yes   Plan:  1.Lovenox 80mg  sq q 12 hours  2.Warfarin 7.5 mg tonight x 1 3.INR now then daily 4.CBC q 72 hours while on lovenox 5.Follow results of VQ scan 6.Order warfarin education  Severiano Gilbert 08/29/2011,2:48 PM

## 2011-08-29 NOTE — Progress Notes (Signed)
Nutrition Brief Note:  RD pulled to pt for difficulty chewing or swallowing.  Pt with new diagnosed nasopharyngeal carcinoma. Pt was seen by SLP today for base line swallow evaluation. MBS completed, recommend regular diet and to be followed by SLP. Per notes once pt starts radiation therapy pt may require tube feedings. Pt eating well at this time. Denies any current problems. PO intake 100% of last meal. No nutrition interventions at this time.  Body mass index is 27.79 kg/(m^2). Pt is overweight  RD is available at cancer center if needed. Please consult if needed while inpatient.   Clarene Duke MARIE

## 2011-08-29 NOTE — Evaluation (Signed)
Clinical/Bedside Swallow Evaluation Patient Details  Name: Shane Marsh MRN: [id number] Date of Birth: 11-24-1950  Today's Date: 08/29/2011 Time: 0750-0830 SLP Time Calculation (min): 40 min  Past Medical History:  Past Medical History  Diagnosis Date  . Cancer   . Hypertension    Past Surgical History:  Past Surgical History  Procedure Date  . 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 He was found to be hypotensive and he also was found to be hypotensive in the ER. His blood pressure improved with fluids. Patient has been admitted for further workup. Patient had a similar episode 3 weeks ago. Patient also has diplopia which he had since early this month and he had an MRI of the brain on Aug 05, 2011 which led to the diagnosis of the nasopharyngeal tumor and subsequently underwent biopsy at [facility] and is planning to get radiation therapy later through [facility] per pt. Pt admits to occasionally getting choked, having some oral residual he has to clear with his finger. Pt has not set up any appts regarding pre-radiation therapy though he states his MD has mentioned the need for a swallow eval as well as dental extraction and a feeding tube.      Assessment / Plan / Recommendation Clinical Impression  Pt presents with left lingual and velar weakness with pt complaints of a mild oral dysphagia. Throat clearing and coughing present with PO intake concerning for reduced airway protection. Pt also to begin radiation therapy in near future with plans for dental extraction and possible feeding tube. Pt would benefit from objective test to determine baseline swallow function especially given indication of possible premorbid dysphagia at bedside eval.     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 He was found to be hypotensive and he also was found to be hypotensive in the ER. His blood pressure improved with fluids. Patient has been admitted for further workup. Patient had a similar episode 3 weeks ago. Patient also has diplopia which he had since early this month and he had an MRI of the brain on Aug 05, 2011 which led to the diagnosis of the nasopharyngeal tumor and subsequently underwent biopsy at [facility] and is planning to get radiation therapy later through [facility] per pt. Pt admits to occasionally getting choked, having some oral residual he has to clear with his finger. Pt has not set up any appts regarding pre-radiation therapy though he states his MD has mentioned the need for a swallow eval as well as dental extraction and a feeding tube.    Type of Study: Bedside swallow evaluation Diet Prior to this Study: NPO Temperature Spikes Noted: No Respiratory Status: Room air History of Intubation: No Behavior/Cognition:  Alert;Cooperative;Pleasant mood Oral Cavity - Dentition: Adequate natural dentition Vision: Functional for self-feeding Patient Positioning: Upright in chair Baseline Vocal Quality: Clear Volitional Cough: Strong Volitional Swallow: Able to elicit    Oral/Motor/Sensory Function Overall Oral Motor/Sensory Function: Impaired Labial ROM: Within Functional Limits Labial Symmetry: Within Functional Limits Labial Strength: Within  Functional Limits Labial Sensation: Within Functional Limits Lingual ROM: Reduced left Lingual Symmetry: Abnormal symmetry left Lingual Strength: Reduced Lingual Sensation: Reduced Facial ROM: Within Functional Limits Facial Symmetry: Within Functional Limits Facial Strength: Within Functional Limits Facial Sensation: Within Functional Limits Velum: Impaired left Mandible: Within Functional Limits   Ice Chips Ice chips: Not tested   Thin Liquid Thin Liquid: Impaired Presentation: Cup Oral Phase Impairments: Reduced lingual movement/coordination Oral Phase Functional Implications: Oral residue Pharyngeal  Phase Impairments: Cough - Delayed;Throat Clearing - Delayed;Decreased hyoid-laryngeal movement    Nectar Thick Nectar Thick Liquid: Not tested   Honey Thick Honey Thick Liquid: Not tested   Puree Puree: Not tested   Solid Solid: Impaired Presentation: Self Fed Oral Phase Impairments: Reduced lingual movement/coordination;Impaired anterior to posterior transit Oral Phase Functional Implications: Oral residue Pharyngeal Phase Impairments: Throat Clearing - Immediate;Cough - Immediate    Virginio Isidore, Riley Nearing 08/29/2011,10:03 AM

## 2011-08-29 NOTE — Progress Notes (Signed)
VASCULAR LAB PRELIMINARY  PRELIMINARY  PRELIMINARY  PRELIMINARY  Carotid Dopplers completed.    Preliminary report:  There is no significant ICA stenosis.  The left ICA is tortuous.  Vertebral artery flow is antegrade.    Sherren Kerns [place], 08/29/2011, 1:50 PM

## 2011-08-29 NOTE — Progress Notes (Signed)
VASCULAR LAB PRELIMINARY  PRELIMINARY  PRELIMINARY  PRELIMINARY  Lower extremity venous Dopplers completed.    Preliminary report:  There is occlusive DVT noted in the left peroneal vein and non occlusive DVT noted in the bilateral popliteal veins and in the right peroneal vein.   Sherren Kerns [place], 08/29/2011, 1:58 PM

## 2011-08-29 NOTE — Progress Notes (Signed)
Subjective: Patient feeling well. No chest pain.  Objective: Filed Vitals:   08/28/11 2113 08/29/11 0247 08/29/11 0559 08/29/11 1348  BP: 113/71 126/68 125/84 128/73  Pulse: 70 61 59 71  Temp: 98.3 F (36.8 C) 98 F (36.7 C) 98 F (36.7 C) 98.8 F (37.1 C)  TempSrc: Oral Oral Oral Oral  Resp: 18 18 18 20   Height: 5\' 8"  (1.727 m)     Weight: 82.6 kg (182 lb 1.6 oz)  82.9 kg (182 lb 12.2 oz)   SpO2: 99% 97% 98% 100%   Weight change:    General: Alert, awake, oriented x3, in no acute distress.  HEENT: No bruits, no goiter.  Heart: Regular rate and rhythm, without murmurs, rubs, gallops.  Lungs: CTA, bilateral air movement.  Abdomen: Soft, nontender, nondistended, positive bowel sounds.  Neuro: Grossly intact, nonfocal. Extremities; No edema.   Lab Results:  [facility] 08/29/11 0520 08/28/11 1626  NA 139 139  K 4.0 5.1  CL 109 107  CO2 21 --  GLUCOSE 97 145*  BUN 14 18  CREATININE 0.82 1.50*  CALCIUM 8.8 --  MG -- --  PHOS -- --    Basename 08/29/11 0520  AST 14  ALT 12  ALKPHOS 47  BILITOT 0.7  PROT 5.8*  ALBUMIN 3.1*    Basename 08/29/11 0520 08/28/11 1626 08/28/11 1613  WBC 15.0* -- 16.9*  NEUTROABS -- -- 14.4*  HGB 13.8 15.6 --  HCT 39.0 46.0 --  MCV 89.9 -- 88.3  PLT 259 -- 309    Basename 08/29/11 0518 08/28/11 2127 08/28/11 1929  CKTOTAL 41 45 46  CKMB 1.5 1.6 1.5  CKMBINDEX -- -- --  TROPONINI <0.30 <0.30 <0.30    Basename 08/28/11 2124  HGBA1C 5.9*    Basename 08/29/11 0520  CHOL 229*  HDL 34*  LDLCALC 166*  TRIG 143  CHOLHDL 6.7  LDLDIRECT --     Studies/Results: Mri Brain Without Contrast  08/29/2011  *RADIOLOGY REPORT*  Clinical Data: Back pain.  History of tonsillar carcinoma.  MRI HEAD WITHOUT CONTRAST  Technique:  Multiplanar, multiecho pulse sequences of the brain and surrounding structures were obtained according to standard protocol without intravenous contrast.  Comparison: MRI 08/05/2011  Findings: Negative for acute  infarct.  Several old small white matter hyperintensities in the frontal white matter are unchanged most likely due to chronic microvascular ischemia.  Mild atrophy is present.  Ventricle size is normal.  Infiltrating mass lesion in the left neck is again identified. This is  surrounding the left internal carotid artery and extending posterior to the cervical carotid artery.  Mass extends to the para pharyngeal soft tissues and infiltrates the longus coli muscle and extends into the clivus.  There is thickening of the dura suggesting some epidural tumor.  There is also extension into the occipital bone on the left at the foramen magnum.  There may be some progression of tumor in the skull base since the prior MRI. The primary mass measures 3.9 x 3.0 cm and may have progressed slightly in the interval.  There is a mastoid sinus effusion on the left due to obstruction of the eustachian tube.  IMPRESSION: Negative for acute infarct.  Left para pharyngeal mass is compatible with carcinoma.  There is invasion of the  skull base and suggestion of mild progression since the recent MRI.  There is a left mastoid sinus effusion due to obstruction of the eustachian tube.  Correlation with biopsy is suggested.  Original Report Authenticated  By: Camelia Phenes, M.D.   Dg Chest Portable 1 View  08/28/2011  *RADIOLOGY REPORT*  Clinical Data: Hypotension.  PORTABLE CHEST - 1 VIEW  Comparison: Chest 08/06/2011.  Findings: Lungs are clear.  Heart size is normal.  No pneumothorax or pleural fluid.  Surgical clips base of the left neck noted.  IMPRESSION: No acute finding.  Original Report Authenticated By: Bernadene Bell. Maricela Curet, M.D.    Medications: I have reviewed the patient's current medications.  1-Syncope:  ECHO: Left ventricle: The cavity size was normal. Systolic function was normal. The estimated ejection fraction was in the range of 55% to 60%. Wall motion was normal; there were no regional wall motion abnormalities.  Cardiac enzymes times 3. UDS negative.  V-Q scan pending. MRI negative for acute stroke. Carotid doppler: Bilateral: mild to moderate soft plaque origin ICA. No significant ICA stenosis. Left ICA is tortuous. Vertebral artery flow is antegrade.  2-Bilateral distal DVT: Prelimanary report There is occlusive DVT noted in the left peroneal vein and non occlusive DVT noted in the bilateral popliteal veins and in the right peroneal vein. I will start Lovenox patient with history of nasopharyngeal cancer at risk for proximal DVT. Coumadin per pharmacy.   3. Recently diagnosed nasopharyngeal carcinoma:  MRI brain: Left para pharyngeal mass is compatible with carcinoma. There is  invasion of the skull base and suggestion of mild progression  since the recent MRI. There is a left mastoid sinus effusion due to obstruction of the eustachian tube. Correlation with biopsy is  Suggested. Patient will need to follow up with oncologist.   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 Follow CBC.  5-Mild renal insufficiency: improved. Holding ACE.       LOS: 1 day   Tomasina Keasling M.D.  [facility] 08/29/2011, 3:06 PM

## 2011-08-29 NOTE — Procedures (Signed)
Objective Swallowing Evaluation: Modified Barium Swallowing Study  Patient Details  Name: Shane Marsh MRN: [id number] Date of Birth: May 06, 1950  Today's Date: 08/29/2011 Time: 1135-1200 SLP Time Calculation (min): 25 min  Past Medical History:  Past Medical History  Diagnosis Date  . Cancer   . Hypertension    Past Surgical History:  Past Surgical History  Procedure Date  . Splectomy    HPI:  61 year old male who was recently diagnosed with nasopharyngeal carcinoma and is being followed at [facility], 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 He was found to be hypotensive and he also was found to be hypotensive in the ER. His blood pressure improved with fluids. Patient has been admitted for further workup. Patient had a similar episode 3 weeks ago. Patient also has diplopia which he had since early this month and he had an MRI of the brain on Aug 05, 2011 which led to the diagnosis of the nasopharyngeal tumor and subsequently underwent biopsy at [facility] and is planning to get radiation therapy later through [facility] per pt. Pt admits to occasionally getting choked, having some oral residual he has to clear with his finger. Pt has not set up any appts regarding pre-radiation therapy though he states his MD has mentioned the need for a swallow eval as well as dental extraction and a feeding tube.        Assessment / Plan / Recommendation Clinical Impression  Dysphagia Diagnosis: Moderate pharyngeal phase dysphagia Clinical impression: Pt presents with a mild to moderate pharyngeal dysphagia with reduced base of tongue propulsion, epiglottic deflection, hyolaryngeal excursion and pharygneal contraction. Weakness (primarily on the left) results in decreased closure of airway during the swallow with trace penetration with normal sips (throat clear) and trace sensed aspiration when taxed. Supraglottic  swallow appeared to reduce penetration during the swallow. Mild to moderate residuals remain post swallow and are not cleared with chin tuck or head turn.  Though pt did exhibit trace aspiraiton, recommend he continue a regular diet with thin liquids utilizing apsirtion precautions. More importantly, pt will need outpatient speech therapy for pharyngeal strengthening exercises and education prior to radiation therapy. Swallow likely to be significantly impacted by radiation and pt's baseline dysphagia will worsen. Pt needs to be followed by SLP.     Treatment Recommendation  Defer treatment plan to SLP at (Comment) (outpatient)    Diet Recommendation Regular;Thin liquid   Liquid Administration via: Cup Medication Administration: Whole meds with liquid Supervision: Patient able to self feed Compensations: Slow rate;Small sips/bites;Multiple dry swallows after each bite/sip;Clear throat intermittently;Effortful swallow Postural Changes and/or Swallow Maneuvers: Seated upright 90 degrees;Upright 30-60 min after meal;Hold breath before and during swallow (Supraglottic swallow)    Other  Recommendations     Follow Up Recommendations  Outpatient SLP    Frequency and Duration        Pertinent Vitals/Pain NA   SLP Swallow Goals     General HPI: 61 year old male who was recently diagnosed with nasopharyngeal carcinoma and is being followed at [facility], 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 He was found to be hypotensive and he also was found to be hypotensive in the ER. His blood pressure improved with fluids. Patient has been admitted for further workup. Patient had a similar episode 3 weeks ago. Patient also has diplopia which  he had since early this month and he had an MRI of the brain on Aug 05, 2011 which led to the diagnosis of the nasopharyngeal tumor and subsequently underwent biopsy at [facility] and is  planning to get radiation therapy later through [facility] per pt. Pt admits to occasionally getting choked, having some oral residual he has to clear with his finger. Pt has not set up any appts regarding pre-radiation therapy though he states his MD has mentioned the need for a swallow eval as well as dental extraction and a feeding tube.    Type of Study: Modified Barium Swallowing Study Reason for Referral: Objectively evaluate swallowing function Diet Prior to this Study: Regular;Thin liquids Temperature Spikes Noted: No Respiratory Status: Room air History of Recent Intubation: No Behavior/Cognition: Alert;Cooperative;Pleasant mood Oral Cavity - Dentition: Adequate natural dentition Oral Motor / Sensory Function: Impaired - see Bedside swallow eval Self-Feeding Abilities: Able to feed self Vision: Functional for self-feeding Patient Positioning: Upright in chair Baseline Vocal Quality: Clear Volitional Cough: Strong Volitional Swallow: Able to elicit Anatomy: Within functional limits Pharyngeal Secretions: Not observed secondary MBS    Reason for Referral Objectively evaluate swallowing function   Oral Phase     Pharyngeal Phase Pharyngeal Phase: Impaired   Cervical Esophageal Phase Cervical Esophageal Phase: [facility]    Shane Marsh, Shane Marsh 08/29/2011, 1:02 PM

## 2011-08-30 ENCOUNTER — Encounter: Payer: Self-pay | Admitting: Radiation Oncology

## 2011-08-30 ENCOUNTER — Observation Stay (HOSPITAL_COMMUNITY): Payer: 59

## 2011-08-30 ENCOUNTER — Ambulatory Visit
Admit: 2011-08-30 | Discharge: 2011-08-30 | Disposition: A | Discharge status: Home / Self Care | Payer: 59 | Attending: Radiation Oncology | Admitting: Radiation Oncology

## 2011-08-30 ENCOUNTER — Other Ambulatory Visit: Payer: Self-pay | Admitting: Radiation Oncology

## 2011-08-30 DIAGNOSIS — C119 Malignant neoplasm of nasopharynx, unspecified: Secondary | ICD-10-CM

## 2011-08-30 DIAGNOSIS — R55 Syncope and collapse: Secondary | ICD-10-CM

## 2011-08-30 DIAGNOSIS — Z0189 Encounter for other specified special examinations: Secondary | ICD-10-CM

## 2011-08-30 LAB — PROTIME-INR: Prothrombin Time: 13.9 seconds (ref 11.6–15.2)

## 2011-08-30 MED ORDER — WARFARIN SODIUM 7.5 MG PO TABS: 7.5000 mg | ORAL_TABLET | Freq: Every day | ORAL | Status: DC

## 2011-08-30 MED ORDER — DEXAMETHASONE 4 MG PO TABS: 4.0000 mg | ORAL_TABLET | Freq: Three times a day (TID) | ORAL | Status: DC

## 2011-08-30 MED ORDER — ENOXAPARIN SODIUM 80 MG/0.8ML ~~LOC~~ SOLN: 80.0000 mg | Freq: Two times a day (BID) | SUBCUTANEOUS | Status: DC

## 2011-08-30 MED ORDER — WARFARIN SODIUM 7.5 MG PO TABS
7.5000 mg | ORAL_TABLET | Freq: Once | ORAL | Status: AC
  Administered 2011-08-30: 7.5 mg via ORAL
  Filled 2011-08-30: qty 1

## 2011-08-30 MED ORDER — ENOXAPARIN (LOVENOX) PATIENT EDUCATION KIT
PACK | Freq: Once | Status: AC
  Administered 2011-08-30: 12:00:00
  Filled 2011-08-30: qty 1

## 2011-08-30 NOTE — Progress Notes (Signed)
Utilization review completed.  

## 2011-08-30 NOTE — Progress Notes (Signed)
MEDICARE-CERTIFIED HOME HEALTH AGENCIES [facility]   Agencies that are Medicare-Certified and affiliated with [facility]  Home Health Agency  Telephone Number Address  [facility]  The [facility] has ownership interest in this company; however, you are under no obligation to use this agency. [phone]  [address] [place] [address]    Agencies that are Medicare-Certified and are not affiliated with The [facility] Agency Telephone Number Address  Aldine Contes [phone] Fax [phone] [address] [place], [place]  [address]  [facility] [phone] [address] [place], [place] [address]  [facility]  [phone] Fax [phone] [address]  [place], [place]  [address]  [facility] [phone] or [phone] [address] [place], [place] [address]  [facility] [phone] or [phone] [address] [place], [place]  [address]      Agencies that are not Medicare-Certified and are not affiliated with [facility] Agency Telephone Number Address  [facility] [phone] Fax [phone] [address] [place], [place]  [address]  [facility] [phone] or [phone] Fax [phone] [address] [place], [place]  [address]  [facility]  [phone] [address] [place], [place]  [employer] [facility] [phone] Fax [phone] [address] [place], [place]  [address]  [facility] [phone] Fax [phone] [address] [place], [place]  [address]  [facility] [phone] [address] [place], [place]  [address]  [facility] [phone] Fax [phone] [address] [place], [place] [address]  [facility] [phone] [address] [place], [place]  [address]  [facility] [phone] Fax [phone] [address] [place], [place]  [address]  In to offer choice of agencies to patient for home health RN for PT/INR lab draws. [facility]. Appropriate referrals will be made.

## 2011-08-30 NOTE — Progress Notes (Signed)
DENTAL CONSULTATION  Date of Consultation:  08/30/2011 Patient Name:   Shane Marsh Date of Birth:   11-Mar-1951 Medical Record Number: [id number]  VITALS: BP 146/90  Pulse 55  Temp(Src) 98.3 F (36.8 C) (Oral)  Resp 20  Ht 5\' 8"  (1.727 m)  Wt 182 lb 12.2 oz (82.9 kg)  BMI 27.79 kg/m2  SpO2 99%   CHIEF COMPLAINT: Patient referred for a pre-chemoradiation therapy dental protocol evaluation.  HPI: Shane Marsh is a 61 year old male referred by Dr. Basilio Cairo for a pre-chemoradiation therapy dental protocol evaluation. Patient recently diagnosed with nasopharyngeal carcinoma. Patient with anticipated chemoradiation therapy. Patient is now seen as part of a pre-chemoradiation therapy dental protocol evaluation. Pateint had previously been seen by dental medicine prior to anticipated radiation therapy for squamous cell carcinoma of the tonsil in 2011. Patient did not undergo radiation therapy at that time.  Patient currently denies acute toothache swellings or abscesses. Patient was last seen for a cleaning with his primary dentist approximately 6 months ago. Patient sees Dr. Jason Fila for his primary dental care. Patient is seen on every 6 month basis by patient report.  Patient indicates that he has his fluoride trays at home but will need new prescription for fluoride therapy. Patient also needs to be seen for reinsertion of the scatter protection devices prior to the anticipated simulation appointment for this coming Tuesday, 09/03/2011 with Dr. Basilio Cairo.  Patient Active Problem List  Diagnoses  . Syncope  . Nasopharyngeal carcinoma  . Renal insufficiency  . H/O splenectomy   PMH: Past Medical History  Diagnosis Date  . Cancer   . Hypertension     PSH: Past Surgical History  Procedure Date  . Splectomy     ALLERGIES: Allergies  Allergen Reactions  . Morphine And Related     MEDICATIONS: Current Facility-Administered Medications  Medication Dose Route Frequency Provider  Last Rate Last Dose  . 0.9 %  sodium chloride infusion   Intravenous Continuous Belkys A Regalado, MD 100 mL/hr at 08/29/11 1628    . diphenhydrAMINE (BENADRYL) tablet 25 mg  25 mg Oral Q6H PRN Eduard Clos, MD   25 mg at 08/30/11 0834  . enoxaparin (LOVENOX) injection 80 mg  80 mg Subcutaneous Once Severiano Gilbert, PHARMD   80 mg at 08/29/11 1627  . enoxaparin (LOVENOX) injection 80 mg  80 mg Subcutaneous Q12H Severiano Gilbert, PHARMD   80 mg at 08/30/11 4098  . enoxaparin (LOVENOX) patient education kit   Does not apply Once Gardner Candle, PHARMD      . HYDROcodone-acetaminophen (NORCO) 5-325 MG per tablet 1.5 tablet  1.5 tablet Oral Q6H PRN Conley Canal, MD   1.5 tablet at 08/29/11 2202  . patient's guide to using coumadin book   Does not apply Once Severiano Gilbert, [facility]      . technetium albumin aggregated (MAA) injection solution 3 milli Curie  3 milli Curie Intravenous Once PRN Medication Radiologist, MD   3 milli Curie at 08/29/11 1452  . warfarin (COUMADIN) tablet 7.5 mg  7.5 mg Oral ONCE-1800 Severiano Gilbert, PHARMD   7.5 mg at 08/29/11 1720  . warfarin (COUMADIN) tablet 7.5 mg  7.5 mg Oral ONCE-1800 Jessica C Carney, PHARMD      . warfarin (COUMADIN) video   Does not apply Once Severiano Gilbert, [facility]      . Warfarin - Pharmacist Dosing Inpatient   Does not apply q1800 Severiano Gilbert, [facility]  LABS: Lab Results  Component Value Date   WBC 15.0* 08/29/2011   HGB 13.8 08/29/2011   HCT 39.0 08/29/2011   MCV 89.9 08/29/2011   PLT 259 08/29/2011      Component Value Date/Time   NA 139 08/29/2011 0520   K 4.0 08/29/2011 0520   CL 109 08/29/2011 0520   CO2 21 08/29/2011 0520   GLUCOSE 97 08/29/2011 0520   BUN 14 08/29/2011 0520   CREATININE 0.82 08/29/2011 0520   CALCIUM 8.8 08/29/2011 0520   GFRNONAA >90 08/29/2011 0520   GFRAA >90 08/29/2011 0520   Lab Results  Component Value Date   INR 1.05 08/30/2011   INR 0.94 08/29/2011   No results found for this  basename: PTT    SOCIAL HISTORY: History   Social History  . Marital Status: Married    Spouse Name: N/A    Number of Children: N/A  . Years of Education: N/A   Occupational History  . Not on file.   Social History Main Topics  . Smoking status: Never Smoker   . Smokeless tobacco: Not on file  . Alcohol Use: No  . Drug Use:   . Sexually Active:    Other Topics Concern  . Not on file   Social History Narrative  . No narrative on file    FAMILY HISTORY: Mother died at age 43 from a heart attack. Father's health is unknown .   REVIEW OF SYSTEMS: Reviewed from chart for this admission.  DENTAL HISTORY: CHIEF COMPLAINT: Patient referred for a pre-chemoradiation therapy dental protocol evaluation.  HPI: Shane Marsh is a 61 year old male referred by Dr. Basilio Cairo for a pre-chemoradiation therapy dental protocol evaluation. Patient recently diagnosed with nasopharyngeal carcinoma. Patient with anticipated chemoradiation therapy. Patient is now seen as part of a pre-chemoradiation therapy dental protocol evaluation. Pateint had previously been seen by dental medicine prior to anticipated radiation therapy for squamous cell carcinoma of the tonsil in 2011. Patient did not undergo radiation therapy at that time.  Patient currently denies acute toothache swellings or abscesses. Patient was last seen for a cleaning with his primary dentist approximately 6 months ago. Patient sees Dr. Jason Fila for his primary dental care. Patient is seen on every 6 month basis by patient report.  Patient indicates that he has his fluoride trays at home but will need new prescription for fluoride therapy. Patient also needs to be seen for reinsertion of the scatter protection devices prior to the anticipated simulation appointment for this coming Tuesday, 09/03/2011 with Dr. Basilio Cairo.   DENTAL EXAMINATION:  GENERAL: Patient is a well-developed, well-nourished male in no acute distress. HEAD AND  NECK: No obvious lymphadenopathy. Patient currently wearing an eye patch over his left eye. INTRAORAL EXAM: Patient has normal saliva. Patient has good oral hygiene. There is no evidence of abscess formation. Patient has bilateral mandibular lingual tori. DENTITION: Patient is missing tooth numbers 1, 16, 17, 18, and 32 . PERIODONTAL: Patient with chronic periodontitis with minimal plaque accumulations, selective areas gingival recession and no obvious tooth mobility. DENTAL CARIES/SUBOPTIMAL RESTORATIONS: There are no obvious dental caries noted at this time. I will need a more thorough examination in my dental clinic to evaluate for possible incipient dental caries. He indicates that he had his previous dental caries restored by his primary dentist in 2011. ENDODONTIC:Patient currently denies acute pulpitis symptoms. I do not see any evidence of periapical pathology at this time. CROWN AND BRIDGE: Patient has several crown restorations that appear to  be acceptable. Patient may benefit from future crown restorations. PROSTHODONTIC: The patient has no need for partial dentures. OCCLUSION: The occlusion is stable at this time.  RADIOGRAPHIC INTERPRETATION:  a panoramic x-ray was taken by the department of radiology. There multiple missing teeth. There is incipient to moderate bone loss. There multiple restorations noted. There is no obvious pedicle pathology or radiolucencies.    ASSESSMENTS: 1. Chronic periodontitis bone loss 2. Generalized gingival recession 3. Incipient to moderate bone loss  4. No obvious tooth mobility 5.Bilateral mandible lingual tori  6. Multiple missing teeth  7. stable occlusion  8. Recent diagnosis of deep vein thromboses with Lovenox and Coumadin therapy with risk for bleeding with invasive dental procedures.     PLAN/RECOMMENDATIONS: 1. I discussed the risks, benefits, and complications of various treatment options with the patient in relationship to the  medical and dental conditions. We discussed various treatment options to include no treatment, multiple extractions  of teeth in the primary field radiation therapy, alveoloplasty, pre-prosthetic surgery as indicated, periodontal therapy, dental restorations, root canal therapy, crown and bridge therapy, implant therapy, and replacement of missing teeth as indicated.  Due to the recent diagnosis of deep vein thromboses and the need for active anticoagulant therapy, dental extractions have been deferred at this time per the wishes of the patient and medical team. This is consistent with a discussion with Dr. Basilio Cairo concerning the need to proceed with anticipated radiation therapy as soon as possible. Patient, however, will followup with his primary dentist for a dental cleaning at this time and is to make sure that Dr. Excell Seltzer knows that he is on anticoagulant therapy.  Patient is to return to dental medicine on Monday for reinsertion of the scatter protection devices to ensure that they fit prior to simulation on Tuesday, 09/03/2011 in [facility]. Patient indicates that he does have his fluoride trays and a new prescription for fluoride therapy will be provided to the patient on Monday.   2. Discussion of findings with medical team and coordination of future medical and dental care as indicated.   Charlynne Pander, DDS

## 2011-08-30 NOTE — ED Provider Notes (Signed)
Medical screening examination/treatment/procedure(s) were conducted as a shared visit with non-physician practitioner(s) and myself.  I personally evaluated the patient during the encounter 61 year old man with recent diagnosis of oropharyngeal cancer had a fainting spell after mowing grass.  In ED he became hypotensive, requiring IV bolus infusion.  Lab workup did not reveal the cause of his syncope.  Admitted to Internal Medicine for further observation and workup.  Carleene Cooper III, MD 08/30/11 [phone]

## 2011-08-30 NOTE — Progress Notes (Signed)
Pt heart rate drop to 35 while asleep, in to assess and HR up to 55. Pt asymptomatic.

## 2011-08-30 NOTE — Discharge Summary (Addendum)
Physician Discharge Summary  Shane Marsh WUJ:[id number] DOB: 06/26/1950 DOA: 08/28/2011  PCP: [name], MD, MD  Admit date: 08/28/2011 Discharge date: 08/30/2011  Discharge Diagnoses:   Syncope  Nasopharyngeal carcinoma  Renal insufficiency  H/O splenectomy  Bilateral distal DVT  Discharge Condition: Stable.   Disposition: Patient will need to follow up with oncologist/PCP for INR check and adjustment of coumadin. He will need to follow up with his radiation oncologist Dr Hedy Camara for treatment.   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 Patient today after working in his yard in his house had a shower and after that he felt uncomfortable and became short of breath he told his wife who was there that he is not feeling well. Patient subsequent lost consciousness for a few seconds and regained immediately. His wife was somewhat called EMS but he told not to. A minute later he again lost consciousness and that time his wife called EMS. His wife was instructed to give chest compressions as she was not sure if patient had a pulse. The wife states she gave at least 2 compression after that she gave couple of breaths through the mouth. After which patient became awake. The whole incident may have lasted 2-3 minutes. During the episode patient was very diaphoretic. By then the EMS reached. He was found to be hypotensive and he also was found to be hypotensive in the ER. His blood pressure improved with fluids. Patient has been admitted for further workup.  Patient had a similar episode 3 weeks ago. Patient also has diplopia which he had since early this month and he had an MRI of the brain on Aug 05, 2011 which led to the diagnosis of the nasopharyngeal tumor and subsequently  underwent biopsy at [facility] and is planning to get radiation therapy later. He had also has left periorbital pain for which he has been prescribed hydrocodone by his oncologist. He also takes Benadryl when necessary for nasal congestion.    Hospital Course:  1-Syncope: Recurrent, unclear etiology, could be related to nasopharyngeal cancer affecting sympathetic system, discuss case with Dr Phineas Real radiation oncologist. I will stop lisinopril to avoid hypotension. Patient had following work up. ECHO: Left ventricle: The cavity size was normal. Systolic function was normal. The estimated ejection fraction was in the range of 55% to 60%. Wall motion was normal; there were no regional wall motion abnormalities. Cardiac enzymes times 3. UDS negative. V-Q scan pending. MRI negative for acute stroke. Carotid doppler: Bilateral: mild to moderate soft plaque origin ICA. No significant ICA stenosis. Left ICA is tortuous. Vertebral artery flow is antegrade.  2-Bilateral distal DVT: Prelimanary report There is occlusive DVT noted in the left peroneal vein and non occlusive DVT noted in the bilateral popliteal veins and in the right peroneal vein. I will start Lovenox patient with history of nasopharyngeal cancer at risk for proximal DVT. Coumadin per pharmacy. Will discharge on Lovenox 80 mg subq BID. He will need at least 5 day bridge with Lovenox and will need Lovenox for 24 hour more after INR therapeutic. He will probably need more refill of Lovenox. Will arrange INR follow up with PCP or oncologist. He will be discharge on Coumadin 7.5 mg daily. INR today at 1.0.  3. Recently diagnosed nasopharyngeal carcinoma:  MRI brain: Left para  pharyngeal mass is compatible with carcinoma. There is invasion of the skull base and suggestion of mild progression  since the recent MRI. There is a left mastoid sinus effusion due to obstruction of the eustachian tube. Correlation with biopsy is  Suggested. Patient will need to  follow up with oncologist. His radiation oncologist will arrange PET scan as out patient for further staging and further treatment. Dr Robin Searing will see patient today prior to discharge.    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 Follow CBC.   5-Mild renal insufficiency: improved. Holding ACE. Resolved.  6-Hypertension: Will discharge off lisinopril to avoid hypotension.      Discharge Exam: Filed Vitals:   08/29/11 2342  BP: 146/90  Pulse: 55  Temp:   Resp: 20   Filed Vitals:   08/29/11 0559 08/29/11 1348 08/29/11 2221 08/29/11 2342  BP: 125/84 128/73 127/88 146/90  Pulse: 59 71 64 55  Temp: 98 F (36.7 C) 98.8 F (37.1 C) 98.3 F (36.8 C)   TempSrc: Oral Oral Oral   Resp: 18 20 22 20   Height:      Weight: 82.9 kg (182 lb 12.2 oz)     SpO2: 98% 100% 96% 99%   General: Patient is in no distress.  Cardiovascular: S1, S2 RRR.  Respiratory: CTA.  Discharge Instructions  Discharge Orders    Future Appointments: Provider: Department: Dept Phone: Center:   09/04/2011 8:30 AM [facility] [phone] [place]     Future Orders Please Complete By Expires   Diet - low sodium heart healthy      Increase activity slowly        Medication List  As of 08/30/2011 10:39 AM   STOP taking these medications         chlorpheniramine 4 MG tablet      lisinopril 5 MG tablet         TAKE these medications         enoxaparin 80 MG/0.8ML injection   Commonly known as: LOVENOX   Inject 0.8 mLs (80 mg total) into the skin every 12 (twelve) hours.      HYDROcodone-acetaminophen 7.5-325 MG per tablet   Commonly known as: NORCO   Take 1 tablet by mouth every 6 (six) hours as needed. For pain      warfarin 7.5 MG tablet   Commonly known as: COUMADIN   Take 1 tablet (7.5 mg total) by mouth daily.              The results of significant diagnostics from this hospitalization (including imaging, microbiology, ancillary and  laboratory) are listed below for reference.    Significant Diagnostic Studies: Dg Eye Foreign Body  08/05/2011  *RADIOLOGY REPORT*  Clinical Data: History of metal within eyes, MRI clearance  ORBITS FOR FOREIGN BODY - 2 VIEW  Comparison: None.  Findings: No orbital metallic foreign body. Nasal septal deviation to the right. Visualized bones and sinuses otherwise unremarkable.  IMPRESSION: No orbital metallic foreign body identified.  Original Report Authenticated By: Lollie Marrow, M.D.   Dg Orthopantogram  08/30/2011  *RADIOLOGY REPORT*  Clinical Data: No evaluation prior to radiation therapy.  ORTHOPANTOGRAM/PANORAMIC  Comparison: None.  Findings: Teeth numbers one and 16 are absent.  No active pathology evident relating to the maxillary dentition.  Teeth numbers 17, 18 and 32 are absent.  There is some periodontal disease related to tooth number 31.  No other significant finding.  IMPRESSION: Missing teeth as  above.  Some periodontal disease tooth number 31.  Original Report Authenticated By: Thomasenia Sales, M.D.   Dg Chest 2 View  08/06/2011  *RADIOLOGY REPORT*  Clinical Data: Shortness of breath with headache.  History of throat cancer.  CHEST - 2 VIEW  Comparison: None.  Findings: Normal heart size with clear lung fields.  No bony abnormality.  Surgical clips left neck.  No effusion or pneumothorax.  IMPRESSION: No active cardiopulmonary disease.  Original Report Authenticated By: Elsie Stain, M.D.   Mri Brain Without Contrast  08/29/2011  *RADIOLOGY REPORT*  Clinical Data: Back pain.  History of tonsillar carcinoma.  MRI HEAD WITHOUT CONTRAST  Technique:  Multiplanar, multiecho pulse sequences of the brain and surrounding structures were obtained according to standard protocol without intravenous contrast.  Comparison: MRI 08/05/2011  Findings: Negative for acute infarct.  Several old small white matter hyperintensities in the frontal white matter are unchanged most likely due to chronic  microvascular ischemia.  Mild atrophy is present.  Ventricle size is normal.  Infiltrating mass lesion in the left neck is again identified. This is  surrounding the left internal carotid artery and extending posterior to the cervical carotid artery.  Mass extends to the para pharyngeal soft tissues and infiltrates the longus coli muscle and extends into the clivus.  There is thickening of the dura suggesting some epidural tumor.  There is also extension into the occipital bone on the left at the foramen magnum.  There may be some progression of tumor in the skull base since the prior MRI. The primary mass measures 3.9 x 3.0 cm and may have progressed slightly in the interval.  There is a mastoid sinus effusion on the left due to obstruction of the eustachian tube.  IMPRESSION: Negative for acute infarct.  Left para pharyngeal mass is compatible with carcinoma.  There is invasion of the  skull base and suggestion of mild progression since the recent MRI.  There is a left mastoid sinus effusion due to obstruction of the eustachian tube.  Correlation with biopsy is suggested.  Original Report Authenticated By: Camelia Phenes, M.D.   Mr Brain Wo Contrast  08/05/2011  **ADDENDUM** CREATED: 08/05/2011 13:09:56  I discussed the findings with on CJ, PA  on 08/05/2011 at 1310 hours.  Additional history includes a history of   tonsillar carcinoma.  This may represent recurrent carcinoma.  Further imaging evaluation is recommended as described in the original report.  **END ADDENDUM** SIGNED BY: Dineen Kid. Chestine Spore, M.D.    08/05/2011  *RADIOLOGY REPORT*  Clinical Data: Sinus pressure and headache.  Double vision  MRI HEAD WITHOUT CONTRAST  Technique:  Multiplanar, multiecho pulse sequences of the brain and surrounding structures were obtained according to standard protocol without intravenous contrast.  Comparison: None.  Findings: Ventricle size is normal.  Mild cerebral atrophy is present.  Mild patchy hyperintensity in the  frontal white matter bilaterally appears chronic and is most likely due to chronic ischemia.  Negative for acute infarct.  No intracranial hemorrhage or fluid collection.  Negative for intracranial mass lesion.  Soft tissue mass is present in the left  posterior nasopharynx, only partially evaluated on this study.  This mass measures approximately 2.1 x 3.9 cm and extends caudally around the left carotid artery which appears to remain patent.  There is also invasion of the left   longus coli muscle. There is possible invasion of the posterior left cavernous sinus and possible invasion of the clivus on the left.  There is a small mastoid sinus effusion on the left compatible with eustachian tube obstruction.  IMPRESSION: Mass lesion in the left posterior nasopharynx extending caudally along the carotid artery.  This is most compatible with nasopharyngeal carcinoma.  There is concern about perineural invasion extending into the left cavernous sinus which may be causing the patient's double vision.  There is also concern about invasion of the skull base.  Further imaging evaluation is suggested.  I would recommend MRI of the skull base without and with contrast to evaluate for tumor extension and perineural tumor.  MRI neck without with contrast also suggested to evaluate for cervical adenopathy.  Original Report Authenticated By: Camelia Phenes, M.D.   Nm Pulmonary Perfusion  08/29/2011  *RADIOLOGY REPORT*  Clinical Data:  Shortness of breath and syncope.  Nasopharyngeal carcinoma.  NUCLEAR MEDICINE PERFUSION LUNG SCAN  Technique:  Perfusion images were obtained in multiple projections after intravenous injection of radiopharmaceutical.  Radiopharmaceutical: CURIE MAA TECHNETIUM TO 43M ALBUMIN AGGREGATED  mCi Tc-78m MAA.  Comparison:  plain film of 08/28/2011.  Findings: No wedge-shaped perfusion defects to suggest pulmonary embolism.  IMPRESSION: Very low probability for pulmonary embolism.  Original Report  Authenticated By: Consuello Bossier, M.D.   Dg Chest Portable 1 View  08/28/2011  *RADIOLOGY REPORT*  Clinical Data: Hypotension.  PORTABLE CHEST - 1 VIEW  Comparison: Chest 08/06/2011.  Findings: Lungs are clear.  Heart size is normal.  No pneumothorax or pleural fluid.  Surgical clips base of the left neck noted.  IMPRESSION: No acute finding.  Original Report Authenticated By: Bernadene Bell. D'ALESSIO, M.D.   Dg Swallowing Func-no Report  08/29/2011  CLINICAL DATA: dysphagia   FLUOROSCOPY FOR SWALLOWING FUNCTION STUDY:  Fluoroscopy was provided for swallowing function study, which was  administered by a speech pathologist.  Final results and recommendations  from this study are contained within the speech pathology report.      Microbiology: No results found for this or any previous visit (from the past 240 hour(s)).   Labs: Basic Metabolic Panel:  Lab 08/29/11 4540 08/28/11 1626  NA 139 139  K 4.0 5.1  CL 109 107  CO2 21 --  GLUCOSE 97 145*  BUN 14 18  CREATININE 0.82 1.50*  CALCIUM 8.8 --  MG -- --  PHOS -- --   Liver Function Tests:  Lab 08/29/11 0520  AST 14  ALT 12  ALKPHOS 47  BILITOT 0.7  PROT 5.8*  ALBUMIN 3.1*   CBC:  Lab 08/29/11 0520 08/28/11 1626 08/28/11 1613  WBC 15.0* -- 16.9*  NEUTROABS -- -- 14.4*  HGB 13.8 15.6 15.7  HCT 39.0 46.0 43.6  MCV 89.9 -- 88.3  PLT 259 -- 309   Cardiac Enzymes:  Lab 08/29/11 1456 08/29/11 0518 08/28/11 2127 08/28/11 1929  CKTOTAL 50 41 45 46  CKMB 1.5 1.5 1.6 1.5  CKMBINDEX -- -- -- --  TROPONINI <0.30 <0.30 <0.30 <0.30   BNP: No components found with this basename: POCBNP:5 CBG:  Lab 08/29/11 1605 08/29/11 1115 08/29/11 0617 08/28/11 2238 08/28/11 1633  GLUCAP 111* 114* 91 121* 139*    Time coordinating discharge: 30 minutes.   SignedHartley Barefoot  [facility] 08/30/2011, 10:39 AM

## 2011-08-30 NOTE — Progress Notes (Signed)
ANTICOAGULATION CONSULT NOTE - Initial Consult  Pharmacy Consult for lovenox>>warfarin Indication: bilateral DVTs/rule out PE  Allergies  Allergen Reactions  . Morphine And Related     Patient Measurements: Height: 5\' 8"  (172.7 cm) Weight: 182 lb 12.2 oz (82.9 kg) (a scale) IBW/kg (Calculated) : 68.4    Vital Signs: Temp: 98.3 F (36.8 C) (05/30 2221) Temp src: Oral (05/30 2221) BP: 146/90 mmHg (05/30 2342) Pulse Rate: 55  (05/30 2342)  Labs:  Alvira Philips 08/30/11 0618 08/29/11 1518 08/29/11 1456 08/29/11 0520 08/29/11 0518 08/28/11 2127 08/28/11 1626 08/28/11 1613  HGB -- -- -- 13.8 -- -- 15.6 --  HCT -- -- -- 39.0 -- -- 46.0 43.6  PLT -- -- -- 259 -- -- -- 309  APTT -- -- -- -- -- -- -- --  LABPROT 13.9 12.8 -- -- -- -- -- --  INR 1.05 0.94 -- -- -- -- -- --  HEPARINUNFRC -- -- -- -- -- -- -- --  CREATININE -- -- -- 0.82 -- -- 1.50* --  CKTOTAL -- -- 50 -- 41 45 -- --  CKMB -- -- 1.5 -- 1.5 1.6 -- --  TROPONINI -- -- <0.30 -- <0.30 <0.30 -- --    Estimated Creatinine Clearance: 101.8 ml/min (by C-G formula based on Cr of 0.82).   Medical History: Past Medical History  Diagnosis Date  . Cancer   . Hypertension     Medications:  Prescriptions prior to admission  Medication Sig Dispense Refill  . chlorpheniramine (CHLOR-TRIMETON) 4 MG tablet Take 4 mg by mouth every 6 (six) hours as needed. For congestion \      . HYDROcodone-acetaminophen (NORCO) 7.5-325 MG per tablet Take 1 tablet by mouth every 6 (six) hours as needed. For pain      . lisinopril (PRINIVIL,ZESTRIL) 5 MG tablet Take 5 mg by mouth daily.        Assessment: 61 year old male with recently diagnosed nasopharyngeal carcinoma and previous tonsillar carcinoma admitted after syncopal episode at home. VQ scan low probability for PE. Dopplers were positive for bilateral DVTs.  INR 1.05 today, has received 1 dose of warfarin.    Day 2/5 VTE overlap with Lovenox/warfarin.  Goal of Therapy:  INR  2-3 Monitor platelets by anticoagulation protocol: Yes   Plan:  1. Continue Lovenox 80mg  sq q 12 hours  2. Repeat Warfarin 7.5 mg tonight x 1 3. Continue daily INR. 4. CBC q 72 hours while on lovenox   Jenica Costilow C 08/30/2011,10:20 AM

## 2011-08-30 NOTE — Progress Notes (Signed)
DC IV, DC Tele, DC Home. Discharge instructions and home medications discussed with patient and patient's wife. Patient and family denied any questions or concerns at this time. Patient leaving unit via wheelchair and appears in no acute distress. 

## 2011-08-30 NOTE — Progress Notes (Signed)
Speech Language Pathology Dysphagia Treatment Patient Details Name: Shane Marsh MRN: [id number] DOB: 1950-06-30 Today's Date: 08/30/2011 Time: 0750-0810 SLP Time Calculation (min): 20 min  Assessment / Plan / Recommendation Clinical Impression  SLP provided further education regarding pharyngeal weakness and dysphagia detected on MBS, usage of strategies and need for outpatient SLP for pharyngeal exercises and therapy prior to, during and after treatment for nasopharyngeal tumor. Pt verbalized strategies. Written education provided to pt/family.     Diet Recommendation  Continue with Current Diet: Regular;Thin liquid    SLP Plan Discharge SLP treatment due to (comment);All goals met (goals met, education complete. )   Pertinent Vitals/Pain NA   Swallowing Goals   all goals met  General Temperature Spikes Noted: No Respiratory Status: Room air Behavior/Cognition: Alert;Cooperative;Pleasant mood Oral Cavity - Dentition: Adequate natural dentition Patient Positioning: Upright in bed  Oral Cavity - Oral Hygiene Does patient have any of the following "at risk" factors?: None of the above   Dysphagia Treatment Treatment focused on: Skilled observation of diet tolerance;Utilization of compensatory strategies Treatment Methods/Modalities: Skilled observation Patient observed directly with PO's: Yes Type of PO's observed: Thin liquids Feeding: Able to feed self Liquids provided via: Cup Pharyngeal Phase Signs & Symptoms: Immediate throat clear Type of cueing: Verbal Amount of cueing: Minimal  Harlon Ditty, MA CCC-SLP [phone]  Claudine Mouton 08/30/2011, 8:41 AM

## 2011-08-30 NOTE — Progress Notes (Signed)
Radiation Oncology         [phone]) [id number] ________________________________  Name: Shane Marsh MRN: [id number]  Date: 08/30/2011  DOB: 01-29-1951  Inpatient Follow-Up Visit Note  Diagnosis:  T4 NX MX nasopharyngeal cancer  Narrative:  The patient was first seen by me on Tuesday May 28th in consultation at [facility] for his nasopharyngeal cancer. I have been in the process of coordinating care, to start radiation planning. A day after consultation the patient had a syncopal episode which prompted him to go to the emergency room. He has been an inpatient at [facility] since. 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 Upon admission, it was noted that his left eye will not abduct. (Although I noted some cranial nerve deficits at consultation, this was not one of them).  The patient's wife has not been able to check for messages at home, and her cell phone is out of batteries so they are not sure when his appointment for his staging PET scan will be.                     ALLERGIES:  is allergic to morphine and related.  Meds: Current Outpatient Prescriptions  Medication Sig Dispense Refill  . dexamethasone (DECADRON) 4 MG tablet Take 1 tablet (4 mg total) by mouth 3 (three) times daily with meals.  60 tablet  0  . enoxaparin (LOVENOX) 80 MG/0.8ML injection Inject 0.8 mLs (80 mg total) into the skin every 12 (twelve) hours.  10 Syringe  0  . HYDROcodone-acetaminophen (NORCO) 7.5-325 MG per tablet Take 1 tablet by mouth every 6 (six) hours as needed. For pain      . warfarin (COUMADIN) 7.5 MG tablet Take 1 tablet (7.5 mg total) by mouth daily.  30 tablet  0   No current facility-administered medications for this encounter.   Facility-Administered Medications Ordered in Other Encounters  Medication Dose Route Frequency Provider Last Rate Last Dose  . 0.9 %  sodium chloride infusion   Intravenous Continuous  Belkys A Regalado, MD 100 mL/hr at 08/29/11 1628    . diphenhydrAMINE (BENADRYL) tablet 25 mg  25 mg Oral Q6H PRN Eduard Clos, MD   25 mg at 08/30/11 0834  . enoxaparin (LOVENOX) injection 80 mg  80 mg Subcutaneous Q12H Severiano Gilbert, PHARMD   80 mg at 08/30/11 1700  . enoxaparin (LOVENOX) patient education kit   Does not apply Once Gardner Candle, PHARMD      . HYDROcodone-acetaminophen (NORCO) 5-325 MG per tablet 1.5 tablet  1.5 tablet Oral Q6H PRN Conley Canal, MD   1.5 tablet at 08/29/11 2202  . warfarin (COUMADIN) tablet 7.5 mg  7.5 mg Oral ONCE-1800 Jessica C Carney, PHARMD   7.5 mg at 08/30/11 1659  . warfarin (COUMADIN) video   Does not apply Once Severiano Gilbert, [facility]      . Warfarin - Pharmacist Dosing Inpatient   Does not apply q1800 Severiano Gilbert, [facility]        Physical Findings: The patient is in no acute distress. Patient is alert and oriented. His left pupil is still constricted, as it was on may 28th. He still has difficulty moving his tongue to the left. Today, he cannot abduct his left eye.  Lab Findings: Lab Results  Component Value Date   WBC 15.0* 08/29/2011   HGB 13.8 08/29/2011   HCT 39.0 08/29/2011  MCV 89.9 08/29/2011   PLT 259 08/29/2011    CMP     Component Value Date/Time   NA 139 08/29/2011 0520   K 4.0 08/29/2011 0520   CL 109 08/29/2011 0520   CO2 21 08/29/2011 0520   GLUCOSE 97 08/29/2011 0520   BUN 14 08/29/2011 0520   CREATININE 0.82 08/29/2011 0520   CALCIUM 8.8 08/29/2011 0520   PROT 5.8* 08/29/2011 0520   ALBUMIN 3.1* 08/29/2011 0520   AST 14 08/29/2011 0520   ALT 12 08/29/2011 0520   ALKPHOS 47 08/29/2011 0520   BILITOT 0.7 08/29/2011 0520   GFRNONAA >90 08/29/2011 0520   GFRAA >90 08/29/2011 0520      Radiographic Findings: Dg Eye Foreign Body  08/05/2011  *RADIOLOGY REPORT*  Clinical Data: History of metal within eyes, MRI clearance  ORBITS FOR FOREIGN BODY - 2 VIEW  Comparison: None.  Findings: No orbital metallic foreign  body. Nasal septal deviation to the right. Visualized bones and sinuses otherwise unremarkable.  IMPRESSION: No orbital metallic foreign body identified.  Original Report Authenticated By: Lollie Marrow, M.D.   Dg Orthopantogram  08/30/2011  *RADIOLOGY REPORT*  Clinical Data: No evaluation prior to radiation therapy.  ORTHOPANTOGRAM/PANORAMIC  Comparison: None.  Findings: Teeth numbers one and 16 are absent.  No active pathology evident relating to the maxillary dentition.  Teeth numbers 17, 18 and 32 are absent.  There is some periodontal disease related to tooth number 31.  No other significant finding.  IMPRESSION: Missing teeth as above.  Some periodontal disease tooth number 31.  Original Report Authenticated By: Thomasenia Sales, M.D.   Dg Chest 2 View  08/06/2011  *RADIOLOGY REPORT*  Clinical Data: Shortness of breath with headache.  History of throat cancer.  CHEST - 2 VIEW  Comparison: None.  Findings: Normal heart size with clear lung fields.  No bony abnormality.  Surgical clips left neck.  No effusion or pneumothorax.  IMPRESSION: No active cardiopulmonary disease.  Original Report Authenticated By: Elsie Stain, M.D.   Mri Brain Without Contrast  08/29/2011  *RADIOLOGY REPORT*  Clinical Data: Back pain.  History of tonsillar carcinoma.  MRI HEAD WITHOUT CONTRAST  Technique:  Multiplanar, multiecho pulse sequences of the brain and surrounding structures were obtained according to standard protocol without intravenous contrast.  Comparison: MRI 08/05/2011  Findings: Negative for acute infarct.  Several old small white matter hyperintensities in the frontal white matter are unchanged most likely due to chronic microvascular ischemia.  Mild atrophy is present.  Ventricle size is normal.  Infiltrating mass lesion in the left neck is again identified. This is  surrounding the left internal carotid artery and extending posterior to the cervical carotid artery.  Mass extends to the para pharyngeal soft  tissues and infiltrates the longus coli muscle and extends into the clivus.  There is thickening of the dura suggesting some epidural tumor.  There is also extension into the occipital bone on the left at the foramen magnum.  There may be some progression of tumor in the skull base since the prior MRI. The primary mass measures 3.9 x 3.0 cm and may have progressed slightly in the interval.  There is a mastoid sinus effusion on the left due to obstruction of the eustachian tube.  IMPRESSION: Negative for acute infarct.  Left para pharyngeal mass is compatible with carcinoma.  There is invasion of the  skull base and suggestion of mild progression since the recent MRI.  There is a left  mastoid sinus effusion due to obstruction of the eustachian tube.  Correlation with biopsy is suggested.  Original Report Authenticated By: Camelia Phenes, M.D.   Mr Brain Wo Contrast  08/05/2011  **ADDENDUM** CREATED: 08/05/2011 13:09:56  I discussed the findings with on CJ, PA  on 08/05/2011 at 1310 hours.  Additional history includes a history of   tonsillar carcinoma.  This may represent recurrent carcinoma.  Further imaging evaluation is recommended as described in the original report.  **END ADDENDUM** SIGNED BY: Dineen Kid. Chestine Spore, M.D.    08/05/2011  *RADIOLOGY REPORT*  Clinical Data: Sinus pressure and headache.  Double vision  MRI HEAD WITHOUT CONTRAST  Technique:  Multiplanar, multiecho pulse sequences of the brain and surrounding structures were obtained according to standard protocol without intravenous contrast.  Comparison: None.  Findings: Ventricle size is normal.  Mild cerebral atrophy is present.  Mild patchy hyperintensity in the frontal white matter bilaterally appears chronic and is most likely due to chronic ischemia.  Negative for acute infarct.  No intracranial hemorrhage or fluid collection.  Negative for intracranial mass lesion.  Soft tissue mass is present in the left  posterior nasopharynx, only partially  evaluated on this study.  This mass measures approximately 2.1 x 3.9 cm and extends caudally around the left carotid artery which appears to remain patent.  There is also invasion of the left   longus coli muscle. There is possible invasion of the posterior left cavernous sinus and possible invasion of the clivus on the left.  There is a small mastoid sinus effusion on the left compatible with eustachian tube obstruction.  IMPRESSION: Mass lesion in the left posterior nasopharynx extending caudally along the carotid artery.  This is most compatible with nasopharyngeal carcinoma.  There is concern about perineural invasion extending into the left cavernous sinus which may be causing the patient's double vision.  There is also concern about invasion of the skull base.  Further imaging evaluation is suggested.  I would recommend MRI of the skull base without and with contrast to evaluate for tumor extension and perineural tumor.  MRI neck without with contrast also suggested to evaluate for cervical adenopathy.  Original Report Authenticated By: Camelia Phenes, M.D.   Nm Pulmonary Perfusion  08/29/2011  *RADIOLOGY REPORT*  Clinical Data:  Shortness of breath and syncope.  Nasopharyngeal carcinoma.  NUCLEAR MEDICINE PERFUSION LUNG SCAN  Technique:  Perfusion images were obtained in multiple projections after intravenous injection of radiopharmaceutical.  Radiopharmaceutical: CURIE MAA TECHNETIUM TO 65M ALBUMIN AGGREGATED  mCi Tc-90m MAA.  Comparison:  plain film of 08/28/2011.  Findings: No wedge-shaped perfusion defects to suggest pulmonary embolism.  IMPRESSION: Very low probability for pulmonary embolism.  Original Report Authenticated By: Consuello Bossier, M.D.   Dg Chest Portable 1 View  08/28/2011  *RADIOLOGY REPORT*  Clinical Data: Hypotension.  PORTABLE CHEST - 1 VIEW  Comparison: Chest 08/06/2011.  Findings: Lungs are clear.  Heart size is normal.  No pneumothorax or pleural fluid.  Surgical clips  base of the left neck noted.  IMPRESSION: No acute finding.  Original Report Authenticated By: Bernadene Bell. D'ALESSIO, M.D.   Dg Swallowing Func-no Report  08/29/2011  CLINICAL DATA: dysphagia   FLUOROSCOPY FOR SWALLOWING FUNCTION STUDY:  Fluoroscopy was provided for swallowing function study, which was  administered by a speech pathologist.  Final results and recommendations  from this study are contained within the speech pathology report.      Impression/Plan: I believe that his syncopal  episode was due to the very advanced nasopharyngeal tumor. I suspect that it is affecting the carotid sinus. I explained this to the patient and the hospitalist. As the patient is about to be discharged, I told him that I would call in a prescription for Decadron 4 mg 3 times a day so that he can pick that up on his way home.  I realize that he is starting on warfarin, and therefore this may possibly alter the effects of the blood thinner. According to his notes, he is being discharged with daily monitoring of his INR. I am going to cc this note to medical oncology at [facility] and ask that they be aware of this as he may require lab testing to be done there, over time.  Will see the patient on June 4 for simulation, treatment planning. I called my staff at [facility] hospital to make sure they're aware that the wife's phone is out of batteries and they have not checked messages at home for a few days. They're going to get in touch with the patient to make sure that he is aware of his other scheduled appointments including his PET scan, PEG tube placement, etc. Dental extractions will not be conducted based on my extensive discussions with Dr. Kristin Bruins, please see his note.   _____________________________________   Lonie Peak, MD

## 2011-08-30 NOTE — Care Management Note (Signed)
    Page 1 of 1   08/30/2011     11:44:47 AM   CARE MANAGEMENT NOTE 08/30/2011  Patient:  Shane Marsh, Shane Marsh   Account Number:  [id number]  Date Initiated:  08/30/2011  Documentation initiated by:  Fransico Michael  Subjective/Objective Assessment:   admitted on 08/28/11 with c/o syncope     Action/Plan:   VQ scan  CT    from home with spouse. Independent with ADLs   Anticipated DC Date:  08/30/2011   Anticipated DC Plan:  HOME W HOME HEALTH SERVICES      DC Planning Services  CM consult      [facility] Choice  HOME HEALTH   Choice offered to / List presented to:  C-1 Patient        HH arranged  HH-1 RN      [facility] agency  [facility]   Status of service:  Completed, signed off Medicare Important Message given?   (If response is "NO", the following Medicare IM given date fields will be blank) Date Medicare IM given:   Date Additional Medicare IM given:    Discharge Disposition:  HOME W HOME HEALTH SERVICES  Per UR Regulation:  Reviewed for med. necessity/level of care/duration of stay  If discussed at Long Length of Stay Meetings, dates discussed:    Comments:  08/30/11-1119-J.Dedra Matsuo,RN,BSN [phone]     Still no call from oncologist office. Phoned PCP office. Dr. [name] at [facility] at [phone] regarding coumadin dosing after discharge. Spoke with Toniann Fail who reports results from PT/INR to be called to [phone]. Dr. Christell Constant will dose coumadin. Corrie Dandy, RN with [facility] notified of referral.   08/30/11-1101-J.Ileanna Gemmill,RN,BSN [phone]      Called Dr. Hezzie Bump (oncology) at [phone] regarding dosing of coumadin after patient discharged from hospital. Left message for triage nurse. Awaiting return call.  08/30/11-1057-J.Eriyah Fernando,RN,BSN [phone]     Phoned [facility] in [place] regarding Lovenox 80mg  every 12 hours. Co pay will be $35.00. Patient notified.  08/30/11-1044-J.Aylah Yeary,RN,BSN [phone]      Noted need for patient to be  discharged with lovenox/coumadin bridge. PT/INR to be drawn on Monday, June 3,2013. In to speak with patient. Reports using [facility] in [place] for prescription drugs. Reports PCP is Dr. [name] in [place] [facility] and oncologist at [facility] is Dr. Hezzie Bump. Choice of home health agencies offered to patient with [facility] being chosen. Appropriate referrals will be made.

## 2011-09-02 ENCOUNTER — Encounter (HOSPITAL_COMMUNITY): Payer: Self-pay | Admitting: Dentistry

## 2011-09-02 ENCOUNTER — Ambulatory Visit (HOSPITAL_COMMUNITY): Payer: Self-pay | Admitting: Dentistry

## 2011-09-02 VITALS — BP 139/83 | HR 59 | Temp 97.3°F

## 2011-09-02 DIAGNOSIS — C119 Malignant neoplasm of nasopharynx, unspecified: Secondary | ICD-10-CM

## 2011-09-02 DIAGNOSIS — K029 Dental caries, unspecified: Secondary | ICD-10-CM

## 2011-09-02 DIAGNOSIS — Z0189 Encounter for other specified special examinations: Secondary | ICD-10-CM

## 2011-09-02 DIAGNOSIS — K0530 Chronic periodontitis, unspecified: Secondary | ICD-10-CM

## 2011-09-02 DIAGNOSIS — K036 Deposits [accretions] on teeth: Secondary | ICD-10-CM

## 2011-09-02 DIAGNOSIS — K089 Disorder of teeth and supporting structures, unspecified: Secondary | ICD-10-CM

## 2011-09-02 MED ORDER — SODIUM FLUORIDE 1.1 % DT GEL: DENTAL | Status: DC

## 2011-09-02 NOTE — Patient Instructions (Signed)
FLUORIDE TRAYS PATIENT INSTRUCTIONS    Obtain prescription from the pharmacy.  Don't be surprised if it needs to be ordered.   Be sure to let the pharmacy know when you are close to needing a new refill for them to have it ready for you without interruption of Fluoride use.   The best time to use your Fluoride is before bed time.   You must brush your teeth very well and floss before using the Fluoride in order to get the best use out of the Fluoride treatments.   Place 1 drop of Fluoride gel per tooth in the tray.   Place the tray on your lower teeth and/or your upper teeth.  Make sure the trays are seated all the way.  Remember, they only fit one way on your teeth.   Insert for 5 full minutes.   At the end of the 5 minutes, take the trays out.  SPIT OUT excess. Marland Kitchen  RADIATION THERAPY AND DECISIONS REGARDING YOUR TEETH  Xerostomia (dry mouth) Your salivary glands may be in the filed of radiation.  Radiation may include all or part of your saliva glands.  This will cause your saliva to dry up and you will have a dry mouth.  The dry mouth will be for the rest of your life unless your radiation oncologist tells you otherwise.  Your saliva has many functions:  Saliva wets your tongue for speaking.  It coats your teeth and the inside of your mouth for easier movement.  It helps with chewing and swallowing food.  It helps clean away harmful acid and toxic products made by the germs in your mouth, therefore it helps prevent cavities.  It kills some germs in your mouth and helps to prevent gum disease.  It helps to carry flavor to your taste buds.  Once you have lost your saliva you will be at higher risk for tooth decay and gum disease.  What can be done to help improve your mouth when there's not enough saliva:  1.  Your dentist may give a prescription for Salagen.  It will not bring back all of your saliva but may bring back some of it.  Also your saliva may be thick and  ropy or white and foamy. It will not feel like it use to feel.  2.  You will need to swish with water every time your mouth feels dry.  YOU CANNOT suck on any cough drops, mints, lemon drops, candy, vitamin C or any other products.  You cannot use anything other than water to make your mouth feel less dry.  If you want to drink anything else you have to drink it all at once and brush afterwards.  Be sure to discuss the details of your diet habits with your dentist or hygienist.  Radiation caries: This is decay that happens very quickly once your mouth is very dry due to radiation therapy.  Normally cavities take six months to two years to become a problem.  When you have dry mouth cavities may take as little as eight weeks to cause you a problem.  This is why dental check ups every two months are necessary as long as you have a dry mouth. Radiation caries typically, but not always, start at your gum line where it is hard to see the cavity.  It is therefore also hard to fill these cavities adequately.  This high rate of cavities happens because your mouth no longer has saliva and therefore  the acid made by the germs starts the decay process.  Whenever you eat anything the germs in your mouth change the food into acid.  The acid then burns a small hole in your tooth.  This small hole is the beginning of a cavity.  If this is not treated then it will grow bigger and become a cavity.  The way to avoid this hole getting bigger is to use fluoride every evening as prescribed by your dentist.  You have to make sure that your teeth are very clean before you use the fluoride.  This fluoride in turn will strengthen your teeth and prepare them for another day of fighting acid.  If you develop radiation caries many times the damage is so large that you will have to have all your teeth removed.  This could be a big problem if some of these teeth are in the field of radiation.  Further details of why this could be a big  problem will follow.  (See Osteoradionecrosis).  Loss of taste (dysgeusia) This happens to varying degrees once you've had radiation therapy to your jaw region.  Many times taste is not completely lost but becomes limited.  The loss of taste is mostly due to radiation affecting your taste buds.  However if you have no saliva in your mouth to carry the flavor to your taste buds it would be difficult for your taste buds to taste anything.  That is why using water or a prescription for Salagen prior to meals and during meals may help with some of the taste.  Keep in mind that taste generally returns very slowly over the course of several months or several years after radiation therapy.  Don't give up hope.  Trismus According to your Radiation Oncologist your TMJ or jaw joints are going to be partially or fully in the field of radiation.  This means that over time the muscles that help you open and close your mouth may get stiff.  This will potentially result in your not being able to open your mouth wide enough or as wide as you can open it now.  Le me give you an example of how slowly this happens and how unaware people are of it.  A gentlemen that had radiation therapy two years ago came back to me complaining that bananas are just too large for him to be able to fit them in between his teeth.  He was not able to open wide enough to bite into a banana.  This happens slowly and over a period of time.  What do we do to try and prevent this?  Your dentist will probably give you a stack of sticks called a trismus exercise device .  This stack will help your remind your muscles and your jaw joint to open up to the same distance every day.  Use these sticks every morning when you wake up according to the instructions given by the dentist.   You must use these sticks for at least one to two years after radiation therapy.  The reason for that is because it happens so slowly and keeps going on for about two years  after radiation therapy.  Your hospital dentist will help you monitor your mouth opening and make sure that it's not getting smaller.  Osteoradionecrosis (ORN) This is a condition where your jaw bone after having had radiation therapy becomes very dry.  It has very little blood supply to keep it alive.  If  you develop a cavity that turns into an abscess or an infection then the jaw bone does not have enough blood supply to help fight the infection.  At this point it is very likely that the infection could cause the death of your jaw bone.  When you have dead bone it has to be removed.  Therefore you might end up having to have surgery to remove part of your jaw bone, the part of the jaw bone that has been affected.   Healing is also a problem if you are to have surgery in the areas where the bone has had radiation therapy.  The same reasons apply.  If you have surgery you need more blood supply which is not available.  When blood supply and oxygen are not available again, there is a chance for the bone to die.  Occasionally ORN happens on its own with no obvious reason.  This is quite rare.  We believe that patients who continue to smoke and/or drink alcohol have a higher chance of having this bone problem.  Therefore once your jaw bone has had radiation therapy if there are any teeth in that area, you should never have them pulled.  You should also never have any surgery on your teeth or gums in that area unless the oral surgeon or Periodontist is aware of your history of radiation. There is some expensive management techniques that might be used to limit your risks.  The risks for ORN either from infection or spontaneous ( or on it's own) are life long.      Do NOT rinse your mouth!    Do NOT eat or drink after treatments for at least 30 minutes.  This is why the best time for your treatments is before bedtime.    Clean the inside of your Fluoride trays using COLD WATER and a  toothbrush.    In order to keep your Trays from discoloring and free from odors, soak them overnight in denture cleaners such as Efferdent.  Do not use bleach or non denture products.    Store the trays in a safe dry place AWAY from any heat until your next treatment.    Bring the trays with you for your next dental check-up.  The dentist will confirm their fit.    If anything happens to your Fluoride trays, or they don't fit as well after any dental work, please let us know as soon as possible.

## 2011-09-02 NOTE — Progress Notes (Signed)
09/02/2011 BP 139/83  Pulse 59  Temp(Src) 97.3 F (36.3 C) (Oral)  Lab Results  Component Value Date   INR 1.05 08/30/2011   INR 0.94 08/29/2011    Shane Marsh is a 61 year old man that presents for periodic oral exam and repeat insertion of scatter protection devices. Patient recently diagnosed with nasopharyngeal carcinoma. Patient with anticipated chemoradiation therapy. He was seen as an inpatient on 08/30/2011 and then we requested followup for examination and reinsertion of scatter protection devices prior to stimulation on 09/03/2011.  Patient Active Problem List  Diagnoses  . Syncope  . Nasopharyngeal carcinoma  . Renal insufficiency  . H/O splenectomy        Deep Vein thromboses   Medical Hx Update:  Past Medical History  Diagnosis Date  . Cancer   . Hypertension   . ALLERGIES/ADVERSE DRUG REACTIONS: Allergies  Allergen Reactions  . Morphine And Related    MEDICATIONS: Current Outpatient Prescriptions  Medication Sig Dispense Refill  . dexamethasone (DECADRON) 4 MG tablet Take 1 tablet (4 mg total) by mouth 3 (three) times daily with meals.  60 tablet  0  . enoxaparin (LOVENOX) 80 MG/0.8ML injection Inject 0.8 mLs (80 mg total) into the skin every 12 (twelve) hours.  10 Syringe  0  . HYDROcodone-acetaminophen (NORCO) 7.5-325 MG per tablet Take 1 tablet by mouth every 6 (six) hours as needed. For pain      . sodium fluoride (FLUORISHIELD) 1.1 % GEL dental gel Brush and floss and then instill one drop of gel into each tooth space of fluoride tray. Place over teeth for 5 minutes.  Remove. Spit out excess. Repeat nightly. Do NOT eat or drinK for 30 minutes.  120 mL  11  . warfarin (COUMADIN) 7.5 MG tablet Take 1 tablet (7.5 mg total) by mouth daily.  30 tablet  0    DENTAL EXAM: General: The patient is a well-developed, Well-nourished male in no acute distress. Vitals: As above. Extraoral Exam: There is no palpable lymphadenopathy.  There are no acute TMJ  Symptoms. Intraoral  Exam: The patient has Normal Saliva. There are no abscesses noted. Patient has bilateral mandibular lingual tori. Dentition: The patient is missing tooth numbers 1, 16, 17, 18, 32. Caries: Patient has incipient dental caries on the facial of tooth #6. Incipient caries on the facial of tooth #15. There is a slight distal overhang on tooth #31. Several areas of abfraction were noted. Endodontic: Patient denies pulpitits symptoms. There is no evidence of periapical radiolucencies. A PA of #23 was taken to rule out periapical pathology possibly noted on the orthopantogram. C&B: There are multiple crown restorations. Crown on tooth #19 has fractured distal porcelain. Patient could be evaluated for crowns on tooth numbers 19 and 31 in the future. Prosthodontic: There is no need for partial dentures. Occlusion: The occlusion is stable. Radiographic Interpretation: An orthopantogram was taken and supplemented with a periapical radiograph of tooth #23. There is incipient to moderate bone loss. There is a distal overhang associated with tooth #31. There are multiple dental restorations noted. There are no obvious periapical radiolucencies.  Plan:  1. I reinserted the upper and lower scatter protection devices. Post-insertion instructions were provided in a written and verbal format on the use and care of appliances. Trismus device was fabricated at 48 mm with 28 tongue depressors. Patient tolerated the procedure well. 2. I provided a prescription for FluoroSHIELD sodium fluoride for use at bedtime. 3. Patient is to call Dr. Jason Fila  for a dental cleaning and restoration of tooth #6 at this time. A copy of the dental radiographs and plan of care was provided to the patient to give to Dr. Excell Seltzer. 4. Patient to followup with Dr. Basilio Cairo for stimulation tomorrow. The patient is to provide the scatter protection devices inserted today to Dr. Basilio Cairo tomorrow. 5. Patient is to return to dental  medicine in approximately 2-3 weeks for a periodic oral examination during radiation therapy. Patient is to call to make an appointment once radiation treatment times are determined.    Charlynne Pander, DDS

## 2011-09-04 ENCOUNTER — Encounter (HOSPITAL_COMMUNITY)
Admission: RE | Admit: 2011-09-04 | Discharge: 2011-09-04 | Disposition: A | Discharge status: Home / Self Care | Payer: 59 | Source: Ambulatory Visit | Attending: Radiation Oncology | Admitting: Radiation Oncology

## 2011-09-04 ENCOUNTER — Encounter (INDEPENDENT_AMBULATORY_CARE_PROVIDER_SITE_OTHER): Payer: 59 | Admitting: Internal Medicine

## 2011-09-04 DIAGNOSIS — C119 Malignant neoplasm of nasopharynx, unspecified: Secondary | ICD-10-CM | POA: Insufficient documentation

## 2011-09-04 DIAGNOSIS — R599 Enlarged lymph nodes, unspecified: Secondary | ICD-10-CM | POA: Insufficient documentation

## 2011-09-04 DIAGNOSIS — C760 Malignant neoplasm of head, face and neck: Secondary | ICD-10-CM

## 2011-09-04 DIAGNOSIS — Z7901 Long term (current) use of anticoagulants: Secondary | ICD-10-CM

## 2011-09-04 DIAGNOSIS — I82409 Acute embolism and thrombosis of unspecified deep veins of unspecified lower extremity: Secondary | ICD-10-CM

## 2011-09-04 MED ORDER — FLUDEOXYGLUCOSE F - 18 (FDG) INJECTION
17.0000 | Freq: Once | INTRAVENOUS | Status: AC | PRN
  Administered 2011-09-04: 17 via INTRAVENOUS

## 2011-09-10 ENCOUNTER — Inpatient Hospital Stay (HOSPITAL_COMMUNITY)
Admission: EM | Admit: 2011-09-10 | Discharge: 2011-09-20 | DRG: 147 | Disposition: A | Discharge status: Home / Self Care | Payer: 59 | Attending: Pulmonary Disease | Admitting: Pulmonary Disease

## 2011-09-10 ENCOUNTER — Emergency Department (HOSPITAL_COMMUNITY): Payer: 59

## 2011-09-10 ENCOUNTER — Encounter (HOSPITAL_COMMUNITY): Payer: Self-pay | Admitting: *Deleted

## 2011-09-10 DIAGNOSIS — I82409 Acute embolism and thrombosis of unspecified deep veins of unspecified lower extremity: Secondary | ICD-10-CM

## 2011-09-10 DIAGNOSIS — C119 Malignant neoplasm of nasopharynx, unspecified: Principal | ICD-10-CM

## 2011-09-10 DIAGNOSIS — C50919 Malignant neoplasm of unspecified site of unspecified female breast: Secondary | ICD-10-CM | POA: Diagnosis present

## 2011-09-10 DIAGNOSIS — R001 Bradycardia, unspecified: Secondary | ICD-10-CM

## 2011-09-10 DIAGNOSIS — Z9089 Acquired absence of other organs: Secondary | ICD-10-CM

## 2011-09-10 DIAGNOSIS — Z9081 Acquired absence of spleen: Secondary | ICD-10-CM

## 2011-09-10 DIAGNOSIS — Z923 Personal history of irradiation: Secondary | ICD-10-CM

## 2011-09-10 DIAGNOSIS — T380X5A Adverse effect of glucocorticoids and synthetic analogues, initial encounter: Secondary | ICD-10-CM | POA: Diagnosis present

## 2011-09-10 DIAGNOSIS — I959 Hypotension, unspecified: Secondary | ICD-10-CM

## 2011-09-10 DIAGNOSIS — I10 Essential (primary) hypertension: Secondary | ICD-10-CM | POA: Diagnosis present

## 2011-09-10 DIAGNOSIS — R791 Abnormal coagulation profile: Secondary | ICD-10-CM | POA: Diagnosis present

## 2011-09-10 DIAGNOSIS — R55 Syncope and collapse: Secondary | ICD-10-CM

## 2011-09-10 DIAGNOSIS — I498 Other specified cardiac arrhythmias: Secondary | ICD-10-CM | POA: Diagnosis present

## 2011-09-10 DIAGNOSIS — D689 Coagulation defect, unspecified: Secondary | ICD-10-CM

## 2011-09-10 DIAGNOSIS — I824Y9 Acute embolism and thrombosis of unspecified deep veins of unspecified proximal lower extremity: Secondary | ICD-10-CM | POA: Diagnosis present

## 2011-09-10 DIAGNOSIS — Z7901 Long term (current) use of anticoagulants: Secondary | ICD-10-CM

## 2011-09-10 DIAGNOSIS — Z86718 Personal history of other venous thrombosis and embolism: Secondary | ICD-10-CM

## 2011-09-10 DIAGNOSIS — R0600 Dyspnea, unspecified: Secondary | ICD-10-CM

## 2011-09-10 DIAGNOSIS — T45515A Adverse effect of anticoagulants, initial encounter: Secondary | ICD-10-CM | POA: Diagnosis present

## 2011-09-10 DIAGNOSIS — C7951 Secondary malignant neoplasm of bone: Secondary | ICD-10-CM | POA: Diagnosis present

## 2011-09-10 DIAGNOSIS — C779 Secondary and unspecified malignant neoplasm of lymph node, unspecified: Secondary | ICD-10-CM | POA: Diagnosis present

## 2011-09-10 DIAGNOSIS — D72829 Elevated white blood cell count, unspecified: Secondary | ICD-10-CM | POA: Diagnosis present

## 2011-09-10 LAB — POCT I-STAT, CHEM 8
Hemoglobin: 14.6 g/dL (ref 13.0–17.0)
Sodium: 140 mEq/L (ref 135–145)
TCO2: 25 mmol/L (ref 0–100)

## 2011-09-10 LAB — POCT I-STAT TROPONIN I: Troponin i, poc: 0 ng/mL (ref 0.00–0.08)

## 2011-09-10 NOTE — ED Notes (Signed)
Pt says that he had feelings that he "could not get any air" Per ems report, the patient is a cancer pt, had radiation treatment this morning, called out for decreased level of consiousness, pt have some respiratory issues on arrival. By ems, pressures sbp 60's, bradycardic, and respirations around 12.  Iv established, approx 500ns given en route. Pt placed on NRB mask.On arrival, pt is responsive, bradycardic, pressures stable

## 2011-09-10 NOTE — ED Provider Notes (Signed)
History     CSN: [id number]  Arrival date & time 09/10/11  2226   First MD Initiated Contact with Patient 09/10/11 2256      Chief Complaint  Patient presents with  . Hypotension    (Consider location/radiation/quality/duration/timing/severity/associated sxs/prior treatment) HPI Comments: Patient is a 61 year old male who presents with a complaint of near syncope with bradycardia and hypotension reported per EMS. According to the medical record the patient has been admitted approximately 2 weeks ago with similar symptoms where he felt like he was having difficulty breathing which was followed by bradycardia and hypotension. He was admitted to the hospital and had her brought workup including an echocardiogram which was normal, imaging of his carotid arteries which did not show any significant abnormalities bilateral distal DVTs which were treated with Lovenox and Coumadin. The patient does note that he was recently diagnosed with nasopharyngeal cancer based on an MRI done in May of 2013. Since that time he has been undergoing biopsies and other treatment options including resection of the left tonsillar mass. He's also had somewhat no dissections in his neck. He had his first treatment of radiation therapy to the left side of his neck which was performed yesterday. He states that today he had acute onset of shortness of breath followed by a feeling of lightheadedness visual changes and a feeling like he was going to pass out. When ambulance personnel arrived they found his blood pressure to be 60 systolic, this was persistent and associated with bradycardia. They transported immediately immediately to the hospital. On arrival the patient's blood pressure and pulse had improved significantly. He denies fevers chills nausea vomiting.  The history is provided by the patient, the spouse, the EMS personnel and medical records.    Past Medical History  Diagnosis Date  . Hypertension   . Cancer      Past Surgical History  Procedure Date  . Splectomy   . Tonsillectomy     No family history on file.  History  Substance Use Topics  . Smoking status: Never Smoker   . Smokeless tobacco: Not on file  . Alcohol Use: No      Review of Systems  All other systems reviewed and are negative.    Allergies  Morphine and related  Home Medications   Current Outpatient Rx  Name Route Sig Dispense Refill  . DEXAMETHASONE 4 MG PO TABS Oral Take 4 mg by mouth 3 (three) times daily with meals.    Marland Kitchen DIPHENHYDRAMINE HCL 25 MG PO TABS Oral Take 25-50 mg by mouth daily.    Marland Kitchen HYDROCODONE-ACETAMINOPHEN 7.5-325 MG PO TABS Oral Take 1 tablet by mouth every 6 (six) hours as needed. For pain    . WARFARIN SODIUM 5 MG PO TABS Oral Take 5 mg by mouth daily.      BP 75/45  Pulse 51  Temp 97.8 F (36.6 C) (Oral)  Resp 26  SpO2 100%  Physical Exam  Nursing note and vitals reviewed. Constitutional: He appears well-developed and well-nourished. No distress.  HENT:  Head: Normocephalic and atraumatic.  Mouth/Throat: Oropharynx is clear and moist. No oropharyngeal exudate.  Eyes: Conjunctivae and EOM are normal. Pupils are equal, round, and reactive to light. Right eye exhibits no discharge. Left eye exhibits no discharge. No scleral icterus.  Neck: Normal range of motion. Neck supple. No JVD present. No thyromegaly present.  Cardiovascular: Regular rhythm, normal heart sounds and intact distal pulses.  Exam reveals no gallop and no friction rub.  No murmur heard.      Relative bradycardia with a pulse of 59, normal capillary refill, normal pulses at the radial arteries bilaterally  Pulmonary/Chest: Effort normal and breath sounds normal. No respiratory distress. He has no wheezes. He has no rales.       Lung sounds clear, no distress  Abdominal: Soft. Bowel sounds are normal. He exhibits no distension and no mass. There is no tenderness.       No abdominal tenderness  Musculoskeletal:  Normal range of motion. He exhibits no edema and no tenderness.       No asymmetry to the lower extremities  Lymphadenopathy:    He has no cervical adenopathy.  Neurological: He is alert. Coordination normal.       Alert and oriented, normal strength and sensation to the bilateral upper and lower extremities, normal coordination  Skin: Skin is warm and dry. No rash noted. No erythema.  Psychiatric: He has a normal mood and affect. His behavior is normal.    ED Course  Procedures (including critical care time)  Labs Reviewed  PROTIME-INR - Abnormal; Notable for the following:    Prothrombin Time 26.8 (*)     INR 2.43 (*)     All other components within normal limits  CBC - Abnormal; Notable for the following:    WBC 17.7 (*)     All other components within normal limits  DIFFERENTIAL - Abnormal; Notable for the following:    Neutrophils Relative 82 (*)     Neutro Abs 14.4 (*)     Lymphocytes Relative 11 (*)     Monocytes Absolute 1.3 (*)     All other components within normal limits  APTT  POCT I-STAT, CHEM 8  POCT I-STAT TROPONIN I   Dg Chest Port 1 View  09/11/2011  *RADIOLOGY REPORT*  Clinical Data: Shortness of breath.  Recent diagnosis of nasal pharyngeal carcinoma.  PORTABLE CHEST - 1 VIEW  Comparison: 08/28/2011 and PET of 09/04/2011.  Findings: Surgical clips at the left side of the thoracic inlet. Midline trachea.  Normal heart size.  No pleural effusion or pneumothorax.  Clear lungs.  IMPRESSION: No acute cardiopulmonary disease.  Original Report Authenticated By: Consuello Bossier, M.D.     1. Hypotension   2. Bradycardia   3. Dyspnea       MDM  Vital signs reflect mild bradycardia, there is no hypotension at this time, we'll check Coumadin level given recent onset of anticoagulant therapy for bilateral DVTs. The patient appears to be undergoing vagal episodes where he becomes bradycardic hypotensive and feels near syncopal. He has essentially resolved at this point  in his symptoms have almost completely resolved. He no longer feels short of breath at this time. Will obtain some basic labs and chest x-ray to rule out other sources of symptoms.  ED ECG REPORT   Date: 09/11/2011   Rate: 57  Rhythm: sinus bradycardia  QRS Axis: normal  Intervals: normal  ST/T Wave abnormalities: normal  Conduction Disutrbances:none  Narrative Interpretation:   Old EKG Reviewed: c/w 08/28/11 no significant changes   The patient continues to have episodes of severe shortness of breath and hypotension. At this time he is having some difficulty breathing, his blood pressure is 68 systolic and his pulse is 50. Review of his results today shows a clear chest x-ray. His coagulation panel shows a normal INR at 2.43, he is anticoagulated, troponin is negative at metabolic panel is normal.  Will discuss with  critical care team. IV fluid bolus being given at this time.   During the patient's emergency department stay he continued to be bradycardic and had several severe bradycardic episodes into the mid 30s. His blood pressure continued to drop into the 70 systolic range. This would intermittently improve with fluids. I discussed his care with the intensivist on-call and the pulmonary critical care team came to admit the patient to the intensive.. The patient maintained his oxygenation and respiratory status throughout his stay  CRITICAL CARE Performed by: Vida Roller   Total critical care time: 35  Critical care time was exclusive of separately billable procedures and treating other patients.  Critical care was necessary to treat or prevent imminent or life-threatening deterioration.  Critical care was time spent personally by me on the following activities: development of treatment plan with patient and/or surrogate as well as nursing, discussions with consultants, evaluation of patient's response to treatment, examination of patient, obtaining history from patient or  surrogate, ordering and performing treatments and interventions, ordering and review of laboratory studies, ordering and review of radiographic studies, pulse oximetry and re-evaluation of patient's condition.    Vida Roller, MD 09/11/11 [phone]

## 2011-09-10 NOTE — ED Notes (Signed)
MD at bedside. 

## 2011-09-11 ENCOUNTER — Encounter (HOSPITAL_COMMUNITY): Payer: Self-pay | Admitting: *Deleted

## 2011-09-11 ENCOUNTER — Inpatient Hospital Stay (HOSPITAL_COMMUNITY): Payer: 59

## 2011-09-11 DIAGNOSIS — R001 Bradycardia, unspecified: Secondary | ICD-10-CM

## 2011-09-11 DIAGNOSIS — D689 Coagulation defect, unspecified: Secondary | ICD-10-CM

## 2011-09-11 DIAGNOSIS — C119 Malignant neoplasm of nasopharynx, unspecified: Principal | ICD-10-CM

## 2011-09-11 DIAGNOSIS — R55 Syncope and collapse: Secondary | ICD-10-CM

## 2011-09-11 DIAGNOSIS — I82409 Acute embolism and thrombosis of unspecified deep veins of unspecified lower extremity: Secondary | ICD-10-CM

## 2011-09-11 DIAGNOSIS — R0609 Other forms of dyspnea: Secondary | ICD-10-CM

## 2011-09-11 DIAGNOSIS — I498 Other specified cardiac arrhythmias: Secondary | ICD-10-CM

## 2011-09-11 DIAGNOSIS — I959 Hypotension, unspecified: Secondary | ICD-10-CM

## 2011-09-11 LAB — CBC
MCHC: 35.8 g/dL (ref 30.0–36.0)
RDW: 13.4 % (ref 11.5–15.5)

## 2011-09-11 LAB — CARDIAC PANEL(CRET KIN+CKTOT+MB+TROPI)
CK, MB: 1.8 ng/mL (ref 0.3–4.0)
CK, MB: 1.7 ng/mL (ref 0.3–4.0)
CK, MB: 1.5 ng/mL (ref 0.3–4.0)
Total CK: 88 U/L (ref 7–232)
Total CK: 50 U/L (ref 7–232)

## 2011-09-11 LAB — DIFFERENTIAL
Basophils Absolute: 0 10*3/uL (ref 0.0–0.1)
Basophils Relative: 0 % (ref 0–1)
Monocytes Absolute: 1.3 10*3/uL — ABNORMAL HIGH (ref 0.1–1.0)
Neutro Abs: 14.4 10*3/uL — ABNORMAL HIGH (ref 1.7–7.7)
Neutrophils Relative %: 82 % — ABNORMAL HIGH (ref 43–77)

## 2011-09-11 LAB — APTT: aPTT: 29 seconds (ref 24–37)

## 2011-09-11 LAB — PROTIME-INR: INR: 2.43 — ABNORMAL HIGH (ref 0.00–1.49)

## 2011-09-11 LAB — HEPARIN LEVEL (UNFRACTIONATED): Heparin Unfractionated: 0.43 IU/mL (ref 0.30–0.70)

## 2011-09-11 MED ORDER — ONDANSETRON HCL 4 MG/2ML IJ SOLN
4.0000 mg | Freq: Four times a day (QID) | INTRAMUSCULAR | Status: DC | PRN
  Administered 2011-09-11 – 2011-09-16 (×4): 4 mg via INTRAVENOUS
  Filled 2011-09-11 (×4): qty 2

## 2011-09-11 MED ORDER — ATROPINE SULFATE 1 MG/ML IJ SOLN
INTRAMUSCULAR | Status: AC
  Filled 2011-09-11: qty 1

## 2011-09-11 MED ORDER — IOHEXOL 350 MG/ML SOLN
100.0000 mL | Freq: Once | INTRAVENOUS | Status: AC | PRN
  Administered 2011-09-11: 100 mL via INTRAVENOUS

## 2011-09-11 MED ORDER — DEXAMETHASONE SODIUM PHOSPHATE 4 MG/ML IJ SOLN
6.0000 mg | Freq: Four times a day (QID) | INTRAMUSCULAR | Status: DC
  Filled 2011-09-11 (×4): qty 1.5

## 2011-09-11 MED ORDER — DOPAMINE-DEXTROSE 3.2-5 MG/ML-% IV SOLN
3.0000 ug/kg/min | INTRAVENOUS | Status: DC
  Administered 2011-09-11: 10 ug/kg/min via INTRAVENOUS
  Administered 2011-09-13 (×2): 7 ug/kg/min via INTRAVENOUS
  Administered 2011-09-13: 7.5 ug/kg/min via INTRAVENOUS
  Administered 2011-09-15: 5 ug/kg/min via INTRAVENOUS
  Administered 2011-09-15: 7 ug/kg/min via INTRAVENOUS
  Administered 2011-09-16: 3 ug/kg/min via INTRAVENOUS
  Filled 2011-09-11 (×4): qty 250

## 2011-09-11 MED ORDER — ONDANSETRON HCL 4 MG/2ML IJ SOLN
4.0000 mg | Freq: Once | INTRAMUSCULAR | Status: AC
  Administered 2011-09-11: 4 mg via INTRAVENOUS

## 2011-09-11 MED ORDER — SODIUM CHLORIDE 0.9 % IV SOLN
INTRAVENOUS | Status: DC
  Administered 2011-09-11 – 2011-09-12 (×4): via INTRAVENOUS

## 2011-09-11 MED ORDER — ONDANSETRON HCL 4 MG/2ML IJ SOLN
INTRAMUSCULAR | Status: AC
  Administered 2011-09-11: 4 mg
  Filled 2011-09-11: qty 2

## 2011-09-11 MED ORDER — WARFARIN SODIUM 5 MG PO TABS
5.0000 mg | ORAL_TABLET | Freq: Every day | ORAL | Status: DC
  Filled 2011-09-11: qty 1

## 2011-09-11 MED ORDER — DEXAMETHASONE SODIUM PHOSPHATE 10 MG/ML IJ SOLN
10.0000 mg | Freq: Four times a day (QID) | INTRAMUSCULAR | Status: DC
  Administered 2011-09-11 – 2011-09-13 (×8): 10 mg via INTRAVENOUS
  Filled 2011-09-11 (×12): qty 1

## 2011-09-11 MED ORDER — PANTOPRAZOLE SODIUM 40 MG IV SOLR
40.0000 mg | Freq: Every day | INTRAVENOUS | Status: DC
  Administered 2011-09-11 – 2011-09-12 (×2): 40 mg via INTRAVENOUS
  Filled 2011-09-11 (×4): qty 40

## 2011-09-11 MED ORDER — SODIUM CHLORIDE 0.9 % IV BOLUS (SEPSIS)
1000.0000 mL | Freq: Once | INTRAVENOUS | Status: AC
  Administered 2011-09-11: 1000 mL via INTRAVENOUS

## 2011-09-11 MED ORDER — ACETAMINOPHEN 325 MG PO TABS
650.0000 mg | ORAL_TABLET | Freq: Four times a day (QID) | ORAL | Status: DC | PRN
  Administered 2011-09-11 – 2011-09-12 (×2): 650 mg via ORAL
  Administered 2011-09-15 – 2011-09-17 (×4): 325 mg via ORAL
  Filled 2011-09-11 (×3): qty 2
  Filled 2011-09-11 (×3): qty 1

## 2011-09-11 MED ORDER — DEXAMETHASONE 4 MG PO TABS
4.0000 mg | ORAL_TABLET | Freq: Two times a day (BID) | ORAL | Status: DC
  Filled 2011-09-11 (×2): qty 1

## 2011-09-11 MED ORDER — HEPARIN (PORCINE) IN NACL 100-0.45 UNIT/ML-% IJ SOLN
550.0000 [IU]/h | INTRAMUSCULAR | Status: DC
  Administered 2011-09-11 – 2011-09-12 (×2): 1000 [IU]/h via INTRAVENOUS
  Administered 2011-09-13 – 2011-09-14 (×2): 750 [IU]/h via INTRAVENOUS
  Filled 2011-09-11 (×5): qty 250

## 2011-09-11 MED ORDER — FENTANYL CITRATE 0.05 MG/ML IJ SOLN
50.0000 ug | INTRAMUSCULAR | Status: DC | PRN
  Administered 2011-09-11: 50 ug via INTRAVENOUS

## 2011-09-11 MED ORDER — FENTANYL CITRATE 0.05 MG/ML IJ SOLN
INTRAMUSCULAR | Status: AC
  Administered 2011-09-11: 50 ug via INTRAVENOUS
  Filled 2011-09-11: qty 2

## 2011-09-11 MED ORDER — OXYCODONE HCL 5 MG PO TABS: 5.0000 mg | ORAL_TABLET | ORAL | Status: DC | PRN

## 2011-09-11 MED ORDER — ONDANSETRON HCL 4 MG/2ML IJ SOLN
4.0000 mg | Freq: Once | INTRAMUSCULAR | Status: AC
  Administered 2011-09-11: 4 mg via INTRAVENOUS
  Filled 2011-09-11: qty 2

## 2011-09-11 MED ORDER — CHLORHEXIDINE GLUCONATE 0.12 % MT SOLN
15.0000 mL | Freq: Two times a day (BID) | OROMUCOSAL | Status: DC
  Administered 2011-09-11 – 2011-09-14 (×6): 15 mL via OROMUCOSAL
  Filled 2011-09-11 (×7): qty 15

## 2011-09-11 MED ORDER — DEXAMETHASONE 4 MG PO TABS
4.0000 mg | ORAL_TABLET | Freq: Three times a day (TID) | ORAL | Status: DC
  Filled 2011-09-11 (×4): qty 1

## 2011-09-11 MED ORDER — BIOTENE DRY MOUTH MT LIQD
15.0000 mL | Freq: Two times a day (BID) | OROMUCOSAL | Status: DC
  Administered 2011-09-11 – 2011-09-13 (×6): 15 mL via OROMUCOSAL

## 2011-09-11 MED ORDER — ONDANSETRON HCL 4 MG/2ML IJ SOLN
INTRAMUSCULAR | Status: AC
  Administered 2011-09-11: 4 mg via INTRAVENOUS
  Filled 2011-09-11: qty 2

## 2011-09-11 MED ORDER — DOPAMINE-DEXTROSE 3.2-5 MG/ML-% IV SOLN: 3.0000 ug/kg/min | INTRAVENOUS | Status: DC

## 2011-09-11 MED ORDER — DOPAMINE-DEXTROSE 3.2-5 MG/ML-% IV SOLN
INTRAVENOUS | Status: AC
  Administered 2011-09-11: 10 ug/kg/min via INTRAVENOUS
  Filled 2011-09-11: qty 250

## 2011-09-11 MED ORDER — DEXAMETHASONE SODIUM PHOSPHATE 4 MG/ML IJ SOLN
10.0000 mg | Freq: Four times a day (QID) | INTRAMUSCULAR | Status: DC
  Filled 2011-09-11 (×4): qty 2.5

## 2011-09-11 MED ORDER — WARFARIN - PHARMACIST DOSING INPATIENT: Freq: Every day | Status: DC

## 2011-09-11 NOTE — Progress Notes (Signed)
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  Events Since Admission: 6/12  Admitted for bradycardia associated with dyspnea, hypotension and syncope 6/12- remain son pressors  Current Status: pressors  Vital Signs: Temp:  [97.8 F (36.6 C)-98.1 F (36.7 C)] 98.1 F (36.7 C) (06/12 0840) Pulse Rate:  [47-72] 56  (06/12 0800) Resp:  [15-29] 18  (06/12 0800) BP: (67-150)/(36-97) 137/67 mmHg (06/12 0800) SpO2:  [96 %-100 %] 97 % (06/12 0800) Weight:  [80.4 kg (177 lb 4 oz)-82.9 kg (182 lb 12.2 oz)] 80.4 kg (177 lb 4 oz) (06/12 0200)  Physical Examination: General:  Lying in bed moaning in some distress Neuro:  Limited exam, follows commands, moves all extremities HEENT:  Oropharynx unremarkable  Neck:  Supple, nolymphadenopathy Cardiovascular:  Sinus bradycardia on monitor, no murmurs Lungs:  CTAB Abdomen:  Soft, nontender, bowel sounds present Musculoskeletal:  Trace edema, lower extremities Skin:  Intact  Active Problems:  Syncope  Nasopharyngeal carcinoma  Bradycardia  Hypotension  DVT (deep venous thrombosis)  Coagulopathy  ASSESSMENT AND PLAN  PULMONARY No results found for this basename: PHART:5,PCO2:5,PCO2ART:5,PO2ART:5,HCO3:5,O2SAT:5 in the last 168 hours Ventilator Settings:   CXR:  6/11 >>> NAD VQ:  5/30 >>> Low probability for PE  ETT:  NA  A:  Episodic dyspnea associated with bradycardia / hypotension.  Recent VQ scan negative for PE.  Appropriately anticoagulated for DVT. P:   Supplemental oxygen Goal SpO2 > 92% inr was therapeutic, with extensive cancer at risk still recurrent PE even with inr 2.4: will re echo assess rv (last wnl) and  dopplers (r/o now mobil or extension) If for CT neck, will assess CT chest  CARDIOVASCULAR  Lab 09/11/11 0412  TROPONINI <0.30  LATICACIDVEN --  PROBNP --   ECG:  6/11 >>> Sinus bradycardia, no ST-T changes TTE:  5/30 >>> EF 55-60%, no wall motion abnormalities, normal valves Carotid Doppler:  5/30 >>> No significant ICA stenosis LE venous Doppler:  5/30 >>> Findings consistent with acute deep vein thrombosis involving the popliteal and peroneal veins of both the right and left lower extremity.  Lines: NA  A: Symptomatic bradycardia associated with hypotension / syncope, suspect related to increased vagal tone related to skull base tumor invasion. Meets criteria AI Etiology: parasympathetic / sympathtic invasion? Dry from vomiting contribution  P:  Telemetry Dopamine gtt, goal HR > 60, MAP 65 Transcutaneous pacing / Atropine PRN Cardiology consult Need echo now assess pericardial space on anticoagulation and met disease? , RV Consider line, cvp cortisol not needed, home decadron known, add home stress decadron 6 q6h CT neck PR interval WNL Dopamine max 7.5 mic, all alpha above  RENAL  Lab 09/10/11 2336  NA 140  K 4.6  CL 104  CO2 --  BUN 19  CREATININE 0.80  CALCIUM --  MG --  PHOS --   Intake/Output      06/11 0701 - 06/12 0700 06/12 0701 - 06/13 0700   I.V. (mL/kg) 527.9 (6.6) 90.1 (1.1)   IV Piggyback  4   Total Intake(mL/kg) 527.9 (6.6) 94.1 (1.2)   Urine (mL/kg/hr) 900 (0.5)    Total Output 900    Net -372.1 +94.1  Emesis Occurrence 2 x     Foley:  NA  A:  No active issues P:   Maintenance IVF NS 75 mL/h Consider  cvp  GASTROINTESTINAL No results found for this basename: AST:5,ALT:5,ALKPHOS:5,BILITOT:5,PROT:5,ALBUMIN:5 in the last 168 hours  A:  N / vom P:   NPO except Rx for now zofran steroids  HEMATOLOGIC  Lab 09/11/11 0030 09/10/11 2336 09/10/11 2329  HGB 15.0 14.6 --  HCT 41.9 43.0 --  PLT 324 -- --  INR -- -- 2.43*    APTT -- -- 29   A:  Coumadin induced coagulopathy.  Appropriate INR. P:  Trend CBC Coumadin, consider dc and add heparin to therapeutic  INFECTIOUS  Lab 09/11/11 0030  WBC 17.7*  PROCALCITON --   Cultures: NA Antibiotics: NA  A:  Steroid induced leukocytosis.  No signs of infection.  History of splenectomy. P:   Trend CBC / temp  ENDOCRINE No results found for this basename: GLUCAP:5 in the last 168 hours A:  No active issues P:   No interventions required  NEUROLOGIC  Brain MRI:  5/30 >>> Left para pharyngeal mass is compatible with carcinoma. There is invasion of the skull base and suggestion of mild progression since the recent MRI. There is a left mastoid sinus effusion due to obstruction of the eustachian tube. Correlation with biopsy is suggested.  A:  Syncope, likely secondary to bradycardia / hypotension.  Nasopharyngeal carcinoma with skull base invasion.  [facility] at [facility]  First radiation treatment on 6/11.  Chemotherapy planned but has not been started. At risk met csf, icp increase - brady P:   Decadron as preadmission top increase Oxycodone PRN pain No clinical findings to suggest csf  / icp issues CT neck Will contact [facility], consider transfer when stable hemodynamically  BEST PRACTICE / DISPOSITION Level of Care:  ICU Primary Service:  PCCM Consultants:  Need to call Cardiology Consult in AM Code Status:  Full Diet:  NPO except Rx DVT Px:  Not indicated (therapeutic Coumadin) GI Px:  Protonix Skin Integrity:  Intact Social / Family:  Updated   Ccm 45 min   Mcarthur Rossetti. Tyson Alias, MD, FACP Pgr: [phone] [facility]

## 2011-09-11 NOTE — Progress Notes (Signed)
  Echocardiogram 2D Echocardiogram has been performed.  Cathie Beams Deneen 09/11/2011, 11:21 AM

## 2011-09-11 NOTE — ED Notes (Signed)
See blank notes from 00:25-01:40AM.

## 2011-09-11 NOTE — Progress Notes (Signed)
INITIAL ADULT NUTRITION ASSESSMENT Date: 09/11/2011   Time: 11:06 AM Reason for Assessment: Nutrition Risk, unintentional weight loss  ASSESSMENT: Male 61 y.o.  Dx: Bradycardia/syncope  Hx:  Past Medical History  Diagnosis Date  . Hypertension   . Cancer     nasal and throat   Related Meds:     . antiseptic oral rinse  15 mL Mouth Rinse q12n4p  . chlorhexidine  15 mL Mouth Rinse BID  . dexamethasone  10 mg Intravenous Q6H  . ondansetron      . ondansetron  4 mg Intravenous Once  . ondansetron (ZOFRAN) IV  4 mg Intravenous Once  . pantoprazole (PROTONIX) IV  40 mg Intravenous QHS  . sodium chloride  1,000 mL Intravenous Once  . DISCONTD: dexamethasone  6 mg Intravenous Q6H  . DISCONTD: dexamethasone  4 mg Oral Q8H  . DISCONTD: dexamethasone  4 mg Oral Q12H  . DISCONTD: warfarin  5 mg Oral q1800  . DISCONTD: Warfarin - Pharmacist Dosing Inpatient   Does not apply q1800     Ht: 5\' 8"  (172.7 cm)  Wt: 177 lb 4 oz (80.4 kg) (bed weight)  Ideal Wt: 70 kg % Ideal Wt: 114%  Usual Wt:  Wt Readings from Last 10 Encounters:  09/11/11 177 lb 4 oz (80.4 kg)  08/29/11 182 lb 12.2 oz (82.9 kg)    % Usual Wt: 97%  Body mass index is 26.95 kg/(m^2). Pt is overweight   Food/Nutrition Related Hx: Pt has had 5 lb weight loss since last admission.   Labs:  CMP     Component Value Date/Time   NA 140 09/10/2011 2336   K 4.6 09/10/2011 2336   CL 104 09/10/2011 2336   CO2 21 08/29/2011 0520   GLUCOSE 98 09/10/2011 2336   BUN 19 09/10/2011 2336   CREATININE 0.80 09/10/2011 2336   CALCIUM 8.8 08/29/2011 0520   PROT 5.8* 08/29/2011 0520   ALBUMIN 3.1* 08/29/2011 0520   AST 14 08/29/2011 0520   ALT 12 08/29/2011 0520   ALKPHOS 47 08/29/2011 0520   BILITOT 0.7 08/29/2011 0520   GFRNONAA >90 08/29/2011 0520   GFRAA >90 08/29/2011 0520     Intake/Output Summary (Last 24 hours) at 09/11/11 1109 Last data filed at 09/11/11 1000  Gross per 24 hour  Intake 798.42 ml  Output    900 ml    Net -101.58 ml      Diet Order: NPO  Supplements/Tube Feeding: none  IVF:    sodium chloride Last Rate: 75 mL/hr at 09/11/11 1610  DOPamine Last Rate: 7.5 mcg/kg/min (09/11/11 1000)  DISCONTD: DOPamine     Estimated Nutritional Needs:   Kcal: 2100-2300 Protein: 96-120 gm  Fluid:  2.1 - 2.3 L  Pt was seen by this RD at last admission. At that time pt had not started treatment for CA, did have swallow evaluation. Pt with first radiation treatment on 6/11. Since last admission pt has lost 5 lbs, 2.7% in about 2 weeks, significant weight loss.  Pt reports that his weight has been fairly stable recently but previously had lost weight. Is not taking any nutrition supplements to help maintain weight. Denies any problems chewing or swallowing. Reports normal appetite/intake.  Pt denies needs for nutrition supplements at this time. RD will continue to follow.   NUTRITION DIAGNOSIS: -Increased nutrient needs (NI-5.1).  Status: Ongoing  RELATED TO: CA, radiation treatment   AS EVIDENCE BY: weight loss since last admission   MONITORING/EVALUATION(Goals): Goal: PO  intake to promote weight maintenance  Monitor: diet advance/PO intake, weight, labs, I/O's, need for nutrition supplements   EDUCATION NEEDS: -No education needs identified at this time  INTERVENTION: 1. RD will continue to assess for nutrition supplement needs once diet advances 2. RD will follow   Dietitian [phone]  DOCUMENTATION CODES Per approved criteria  -Not Applicable    Clarene Duke MARIE 09/11/2011, 11:06 AM

## 2011-09-11 NOTE — Progress Notes (Signed)
   ANTICOAGULATION CONSULT NOTE - Follow Up  Pharmacy Consult for IV Heparin Indication: DVT  Allergies  Allergen Reactions  . Morphine And Related Nausea And Vomiting    Patient Measurements: Height: 5\' 8"  (172.7 cm) Weight: 177 lb 4 oz (80.4 kg) (bed weight) IBW/kg (Calculated) : 68.4  Heparin Dosing Weight: 80.4 kg  Vital Signs: Temp: 98.8 F (37.1 C) (06/12 1600) Temp src: Oral (06/12 1600) BP: 108/61 mmHg (06/12 2100) Pulse Rate: 63  (06/12 2100)  Labs:  Basename 09/11/11 2130 09/11/11 1420 09/11/11 0412 09/11/11 0030 09/10/11 2336 09/10/11 2329  HGB -- -- -- 15.0 14.6 --  HCT -- -- -- 41.9 43.0 --  PLT -- -- -- 324 -- --  APTT -- -- -- -- -- 29  LABPROT -- -- -- -- -- 26.8*  INR -- -- -- -- -- 2.43*  HEPARINUNFRC 0.43 -- -- -- -- --  CREATININE -- -- -- -- 0.80 --  CKTOTAL -- 64 88 -- -- --  CKMB -- 1.7 1.8 -- -- --  TROPONINI -- <0.30 <0.30 -- -- --    Estimated Creatinine Clearance: 96.2 ml/min (by C-G formula based on Cr of 0.8).   Medical History: Past Medical History  Diagnosis Date  . Hypertension   . Cancer     nasal and throat    Medications:  Scheduled:     . antiseptic oral rinse  15 mL Mouth Rinse q12n4p  . chlorhexidine  15 mL Mouth Rinse BID  . dexamethasone  10 mg Intravenous Q6H  . ondansetron      . ondansetron  4 mg Intravenous Once  . ondansetron (ZOFRAN) IV  4 mg Intravenous Once  . pantoprazole (PROTONIX) IV  40 mg Intravenous QHS  . sodium chloride  1,000 mL Intravenous Once  . DISCONTD: dexamethasone  10 mg Intravenous Q6H  . DISCONTD: dexamethasone  6 mg Intravenous Q6H  . DISCONTD: dexamethasone  4 mg Oral Q8H  . DISCONTD: dexamethasone  4 mg Oral Q12H  . DISCONTD: warfarin  5 mg Oral q1800  . 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 CCM changing Coumadin to IV heparin for possible procedures. INR 2.43 on admit 6/11 (last PM).  CBC wnl. No bleeding noted. Last dose of Coumadin was 6/11.  Heparin level tonight is 0.43 IU/ml and is within desired range.  No bleeding complications noted.  Goal of Therapy:  Heparin level 0.3-0.7 units/ml Monitor platelets by anticoagulation protocol: Yes   Plan:   Continue IV heparin at 1000 units/hr.  Follow-up Heparin level in the AM and titrate up as INR trends down.  Daily heparin level and CBC.    Nadara Mustard, PharmD., MS Clinical Pharmacist Pager:  [phone]  Thank you for allowing pharmacy to be part of this patients care team. 09/11/2011,10:20 PM

## 2011-09-11 NOTE — Plan of Care (Signed)
Problem: Consults Goal: General Medical Patient Education See Patient Education Module for specific education. Outcome: Completed/Met Date Met:  09/11/11 Oriented to MICU

## 2011-09-11 NOTE — Progress Notes (Signed)
   ANTICOAGULATION CONSULT NOTE - Initial Consult  Pharmacy Consult for IV Heparin Indication: DVT  Allergies  Allergen Reactions  . Morphine And Related Nausea And Vomiting    Patient Measurements: Height: 5\' 8"  (172.7 cm) Weight: 177 lb 4 oz (80.4 kg) (bed weight) IBW/kg (Calculated) : 68.4  Heparin Dosing Weight: 80.4 kg  Vital Signs: Temp: 98.1 F (36.7 C) (06/12 0840) Temp src: Oral (06/12 0840) BP: 118/62 mmHg (06/12 0900) Pulse Rate: 57  (06/12 0900)  Labs:  Basename 09/11/11 0412 09/11/11 0030 09/10/11 2336 09/10/11 2329  HGB -- 15.0 14.6 --  HCT -- 41.9 43.0 --  PLT -- 324 -- --  APTT -- -- -- 29  LABPROT -- -- -- 26.8*  INR -- -- -- 2.43*  HEPARINUNFRC -- -- -- --  CREATININE -- -- 0.80 --  CKTOTAL 88 -- -- --  CKMB 1.8 -- -- --  TROPONINI <0.30 -- -- --    Estimated Creatinine Clearance: 96.2 ml/min (by C-G formula based on Cr of 0.8).   Medical History: Past Medical History  Diagnosis Date  . Hypertension   . Cancer     nasal and throat    Medications:  Scheduled:    . antiseptic oral rinse  15 mL Mouth Rinse q12n4p  . chlorhexidine  15 mL Mouth Rinse BID  . dexamethasone  6 mg Intravenous Q6H  . ondansetron      . ondansetron  4 mg Intravenous Once  . ondansetron (ZOFRAN) IV  4 mg Intravenous Once  . pantoprazole (PROTONIX) IV  40 mg Intravenous QHS  . sodium chloride  1,000 mL Intravenous Once  . DISCONTD: dexamethasone  4 mg Oral Q8H  . DISCONTD: dexamethasone  4 mg Oral Q12H  . DISCONTD: warfarin  5 mg Oral q1800  . 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 CCM changing Coumadin to IV heparin for possible procedures. INR 2.43 on admit 6/11 (last PM).  CBC wnl. No bleeding noted. Last dose of Coumadin was 6/11.    Goal of Therapy:  Heparin level 0.3-0.7 units/ml Monitor platelets by anticoagulation protocol: Yes   Plan:  1. Discontinue Coumadin. 2. No heparin bolus. Starting heparin per Dr. Tyson Alias 1 hour after central line is placed despite INR at 2.43 at 1000 units/hr (65ml/hr). 3. Follow-up Heparin level 6 hours after heparin is started. 4. Daily heparin level and CBC.   Link Snuffer, PharmD, BCPS Clinical Pharmacist [phone] 09/11/2011,9:51 AM

## 2011-09-11 NOTE — Progress Notes (Signed)
ANTICOAGULATION CONSULT NOTE - Initial Consult  Pharmacy Consult for Coumadin Indication: DVT  Allergies  Allergen Reactions  . Morphine And Related Nausea And Vomiting    Patient Measurements:    Vital Signs: Temp: 97.8 F (36.6 C) (06/11 2230) Temp src: Oral (06/11 2230) BP: 75/45 mmHg (06/12 0015) Pulse Rate: 51  (06/12 0015)  Labs:  Basename 09/11/11 0030 09/10/11 2336 09/10/11 2329  HGB 15.0 14.6 --  HCT 41.9 43.0 --  PLT 324 -- --  APTT -- -- 29  LABPROT -- -- 26.8*  INR -- -- 2.43*  HEPARINUNFRC -- -- --  CREATININE -- 0.80 --  CKTOTAL -- -- --  CKMB -- -- --  TROPONINI -- -- --    The CrCl is unknown because both a height and weight (above a minimum accepted value) are required for this calculation.   Medical History: Past Medical History  Diagnosis Date  . Hypertension   . Cancer     Medications:  Scheduled:    . DOPamine      . ondansetron      . sodium chloride  1,000 mL Intravenous Once    Assessment: 61 yo male with h/o of nasopharyngeal carcinoma and DVT. Pharmacy consulted to manage Coumadin. INR is at-goal. Home Coumadin regimen of 5mg  po daily.   Goal of Therapy:  INR 2-3 Monitor platelets by anticoagulation protocol: Yes   Plan:  1. Continue home Coumadin regimen of 5mg  po daily.  2. Daily PT / INR.   Emeline Gins 09/11/2011,1:50 AM

## 2011-09-11 NOTE — H&P (Signed)
Name: Shane Marsh MRN: [id number] DOB: December 07, 1950    LOS: 1  Referring Provider:  EDP Reason for Referral:  Bradycardia / syncope   PULMONARY / CRITICAL CARE MEDICINE  The patient is in distress and unable to provide history, which was obtained for available medical records and patient's wife.  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  The rest of the workup was negative at that time.  Presented to ED on 6/11 several syncopal episodes at home.  During last episode patient's wife had to give chest compressions as she was not sure he had pulse.  He regain consciousness after several compressions and was brought to [facility] ED by EMS.  In ED, there were several more episodes of syncope associated with dyspnea, bradycardia, hypotension, diaphoresis and dyspnea - they resolved spontaneously without intervention. Dopamine started in ED to prevent symptomatic bradycardia.  PCCM was called.  Past Medical History  Diagnosis Date  . Hypertension   . Cancer     nasal and throat   Past Surgical History  Procedure Date  . Splectomy   . Tonsillectomy     one side   Prior to Admission medications   Medication Sig Start Date End Date Taking? Authorizing Provider  dexamethasone (DECADRON) 4 MG tablet Take 4 mg by mouth 2 (two) times daily.    Yes Historical Provider, MD  diphenhydrAMINE (BENADRYL) 25 MG tablet Take 25-50 mg by mouth daily.   Yes Historical Provider, MD  HYDROcodone-acetaminophen (NORCO) 7.5-325 MG per tablet Take 1 tablet by mouth every 6 (six) hours as needed. For pain   Yes Historical Provider, MD  warfarin (COUMADIN) 5 MG tablet Take 5 mg by mouth daily.   Yes Historical Provider, MD   Allergies Allergies  Allergen Reactions  . Morphine And Related Nausea And Vomiting   Family History No family history on  file. Social History  reports that he has never smoked. He does not have any smokeless tobacco history on file. He reports that he does not drink alcohol. His drug history not on file.  Review Of Systems:  Unable to provide.  Brief patient description:  61 y/o with nasopharyngeal carcinoma invading skull base and bilateral LE DVT on Coumadin admitted for bradycardia associated with dyspnea, hypotension and syncope.  Events Since Admission: 6/12  Admitted for bradycardia associated with dyspnea, hypotension and syncope  Current Status:  Vital Signs: Temp:  [97.8 F (36.6 C)] 97.8 F (36.6 C) (06/12 0145) Pulse Rate:  [47-60] 60  (06/12 0245) Resp:  [16-29] 18  (06/12 0245) BP: (67-134)/(36-97) 123/91 mmHg (06/12 0300) SpO2:  [96 %-100 %] 99 % (06/12 0245) Weight:  [80.4 kg (177 lb 4 oz)-82.9 kg (182 lb 12.2 oz)] 80.4 kg (177 lb 4 oz) (06/12 0200)  Physical Examination: General:  Lying in bed moaning in some distress Neuro:  Limited exam, follows commands, moves all extremities HEENT:  Oropharynx unremarkable  Neck:  Supple, nolymphadenopathy Cardiovascular:  Sinus bradycardia on monitor, no murmurs Lungs:  CTAB Abdomen:  Soft, nontender, bowel sounds present Musculoskeletal:  Trace edema, lower extremities Skin:  Intact  Active Problems:  Syncope  Bradycardia  Nasopharyngeal carcinoma  Hypotension  DVT (deep venous thrombosis)  Coagulopathy  ASSESSMENT AND PLAN  PULMONARY No results found for this basename: PHART:5,PCO2:5,PCO2ART:5,PO2ART:5,HCO3:5,O2SAT:5 in the last 168 hours Ventilator Settings:   CXR:  6/11 >>> NAD VQ:  5/30 >>> Low probability for PE  ETT:  NA  A:  Episodic dyspnea associated with bradycardia / hypotension.  Recent VQ scan negative for PE.  Appropriately anticoagulated for DVT. P:   Supplemental oxygen Goal SpO2 > 92%  CARDIOVASCULAR  Lab 09/11/11 0412  TROPONINI <0.30  LATICACIDVEN --  PROBNP --   ECG:  6/11 >>> Sinus bradycardia,  no ST-T changes TTE:  5/30 >>> EF 55-60%, no wall motion abnormalities, normal valves Carotid Doppler:  5/30 >>> No significant ICA stenosis LE venous Doppler:  5/30 >>> Findings consistent with acute deep vein thrombosis involving the popliteal and peroneal veins of both the right and left lower extremity.  Lines: NA  A: Symptomatic bradycardia associated with hypotension / syncope, suspect related to increased vagal tone related to skull base tumor invasion. P:  Telemetry Dopamine gtt, goal HR > 60 Transcutaneous pacing / Atropine PRN Will place transvenous pacer if additional episodes Cardiology consult to evaluate for permanent pacer in AM Trend cardiac enzymes  RENAL  Lab 09/10/11 2336  NA 140  K 4.6  CL 104  CO2 --  BUN 19  CREATININE 0.80  CALCIUM --  MG --  PHOS --   Intake/Output      06/11 0701 - 06/12 0700   I.V. (mL/kg) 167.5 (2.1)   Total Intake(mL/kg) 167.5 (2.1)   Urine (mL/kg/hr) 900 (0.5)   Total Output 900   Net -732.5       Emesis Occurrence 2 x    Foley:  NA  A:  No active issues P:   Maintenance IVF NS 75 mL/h  GASTROINTESTINAL No results found for this basename: AST:5,ALT:5,ALKPHOS:5,BILITOT:5,PROT:5,ALBUMIN:5 in the last 168 hours  A:  No active issues P:   NPO except Rx for now  HEMATOLOGIC  Lab 09/11/11 0030 09/10/11 2336 09/10/11 2329  HGB 15.0 14.6 --  HCT 41.9 43.0 --  PLT 324 -- --  INR -- -- 2.43*  APTT -- -- 29   A:  Coumadin induced coagulopathy.  Appropriate INR. P:  Trend CBC Coumadin per pharmacy  INFECTIOUS  Lab 09/11/11 0030  WBC 17.7*  PROCALCITON --   Cultures: NA Antibiotics: NA  A:  Steroid induced leukocytosis.  No signs of infection.  History of splenectomy. P:   Trend CBC / temp  ENDOCRINE  Lab 09/04/11 0816  GLUCAP 96   A:  No active issues P:   No interventions required  NEUROLOGIC  Brain MRI:  5/30 >>> Left para pharyngeal mass is compatible with carcinoma. There is invasion of  the skull base and suggestion of mild progression since the recent MRI. There is a left mastoid sinus effusion due to obstruction of the eustachian tube. Correlation with biopsy is suggested.  A:  Syncope, likely secondary to bradycardia / hypotension.  Nasopharyngeal carcinoma with skull base invasion.  Followed at [facility].  First radiation treatment on 6/11.  Chemotherapy planned but has not been started. P:   Decadron as preadmission Oxycodone PRN pain  BEST PRACTICE / DISPOSITION Level of Care:  ICU Primary Service:  PCCM Consultants:  Need to call Cardiology Consult in AM Code Status:  Full Diet:  NPO except Rx DVT Px:  Not indicated (therapeutic Coumadin) GI Px:  Protonix Skin Integrity:  Intact Social / Family:  Updated  The patient is critically ill with multiple organ systems failure and requires high complexity decision making for assessment and support, frequent evaluation and titration of therapies, application of  advanced monitoring technologies and extensive interpretation of multiple databases. Critical Care Time devoted to patient care services described in this note is 35 minutes.  Lonia Farber, M.D. Pulmonary and Critical Care Medicine [facility] Pager: [phone]  09/11/2011, 5:11 AM

## 2011-09-11 NOTE — ED Notes (Signed)
Pt wife called out saying that the patient was having an episode again. On assessment in room, pt is clammy, voices he is not getting enough air. Pt is hypotensive, in the 60's. Open fluids to bolus. Notified dr. Hyacinth Meeker of the same. Pt also c/o nausea, administered zofran. NRB mask applied for pt comfort.

## 2011-09-11 NOTE — Procedures (Addendum)
Central Venous Catheter Insertion Procedure Note Juanjose Mojica [id number] [birth year]  Procedure: Insertion of Central Venous Catheter Indications: Assessment of intravascular volume, Drug and/or fluid administration and Frequent blood sampling  Procedure Details Consent: Risks of procedure as well as the alternatives and risks of each were explained to the (patient/caregiver).  Consent for procedure obtained. Time Out: Verified patient identification, verified procedure, site/side was marked, verified correct patient position, special equipment/implants available, medications/allergies/relevent history reviewed, required imaging and test results available.  Performed Real time Ultra Sound used to identify and cannulate the right IJ  Maximum sterile technique was used including antiseptics, cap, gloves, gown, hand hygiene, mask and sheet. Skin prep: Chlorhexidine; local anesthetic administered A antimicrobial bonded/coated triple lumen catheter was placed in the right internal jugular vein using the Seldinger technique.  Evaluation Blood flow good Complications: No apparent complications Patient did tolerate procedure well. Chest X-ray ordered to verify placement.  CXR: pending.  BABCOCK,PETE 09/11/2011, 2:24 PM   Tolerated Well Pressors [place] Risk on inr 2.4 known to pt, did not want to interrupt inr, ffp etc with concnern PE then  Mcarthur Rossetti. Tyson Alias, MD, FACP Pgr: [phone] [facility]

## 2011-09-12 LAB — CBC
MCH: 31.7 pg (ref 26.0–34.0)
MCHC: 35.8 g/dL (ref 30.0–36.0)
Platelets: 309 10*3/uL (ref 150–400)

## 2011-09-12 LAB — HEPARIN LEVEL (UNFRACTIONATED): Heparin Unfractionated: 0.55 IU/mL (ref 0.30–0.70)

## 2011-09-12 LAB — MAGNESIUM: Magnesium: 2.2 mg/dL (ref 1.5–2.5)

## 2011-09-12 LAB — BASIC METABOLIC PANEL
Calcium: 8.9 mg/dL (ref 8.4–10.5)
GFR calc Af Amer: >90 mL/min (ref >90)
GFR calc non Af Amer: >90 mL/min (ref >90)
Sodium: 136 mEq/L (ref 135–145)

## 2011-09-12 LAB — POCT I-STAT 3, ART BLOOD GAS (G3+)
Acid-Base Excess: 1 mmol/L (ref 0.0–2.0)
Patient temperature: 98.1

## 2011-09-12 LAB — PROTIME-INR: Prothrombin Time: 26.7 seconds — ABNORMAL HIGH (ref 11.6–15.2)

## 2011-09-12 MED ORDER — ONDANSETRON HCL 8 MG PO TABS: 8.0000 mg | ORAL_TABLET | Freq: Two times a day (BID) | ORAL | Status: DC | PRN

## 2011-09-12 MED ORDER — ATROPINE SULFATE 0.1 MG/ML IJ SOLN
1.0000 mg | Freq: Once | INTRAMUSCULAR | Status: AC
  Administered 2011-09-12: 0.5 mg via INTRAVENOUS

## 2011-09-12 MED ORDER — PROCHLORPERAZINE MALEATE 10 MG PO TABS: 10.0000 mg | ORAL_TABLET | Freq: Four times a day (QID) | ORAL | Status: DC | PRN

## 2011-09-12 MED ORDER — ALPRAZOLAM 0.25 MG PO TABS
0.2500 mg | ORAL_TABLET | Freq: Once | ORAL | Status: AC
  Administered 2011-09-12: 0.25 mg via ORAL
  Filled 2011-09-12: qty 1

## 2011-09-12 MED ORDER — ATROPINE SULFATE 1 MG/ML IJ SOLN
INTRAMUSCULAR | Status: AC
  Filled 2011-09-12: qty 1

## 2011-09-12 MED ORDER — DIPHENHYDRAMINE HCL 50 MG/ML IJ SOLN
25.0000 mg | Freq: Once | INTRAMUSCULAR | Status: AC
  Administered 2011-09-12: 25 mg via INTRAVENOUS
  Filled 2011-09-12: qty 1

## 2011-09-12 NOTE — Significant Event (Signed)
Pt c/o sinus congestion, and requests benadryl.  Will give 25 mg IV x one.  Coralyn Helling, MD 09/12/2011, 6:38 AM

## 2011-09-12 NOTE — Progress Notes (Signed)
1215: Went to answer call light and patient was Shane Marsh with HR below 40 bpm, Hypotensive, flushed and sweating.  He maintained orientation and ability to interact. Increased Dopamine gtt and called CCM NP to the room. Patient nauseated and gave Zofran 4mg  IVP.  CCM NP ordered to give one half amp of Atropine for symptomatic Bardycardia.   HR and BP improved with atropine admin.

## 2011-09-12 NOTE — Progress Notes (Signed)
ANTICOAGULATION CONSULT NOTE - Follow Up  Pharmacy Consult for IV Heparin Indication: DVT  Allergies  Allergen Reactions  . Morphine And Related Nausea And Vomiting    Patient Measurements: Height: 5\' 8"  (172.7 cm) Weight: 177 lb 4 oz (80.4 kg) (bed weight) IBW/kg (Calculated) : 68.4  Heparin Dosing Weight: 80.4 kg  Vital Signs: Temp: 97.9 F (36.6 C) (06/13 0353) Temp src: Oral (06/13 0353) BP: 120/72 mmHg (06/13 0800) Pulse Rate: 61  (06/13 0800)  Labs:  Alvira Philips 09/12/11 0446 09/11/11 2200 09/11/11 2130 09/11/11 1420 09/11/11 0412 09/11/11 0030 09/10/11 2336 09/10/11 2329  HGB 15.2 -- -- -- -- 15.0 -- --  HCT 42.5 -- -- -- -- 41.9 43.0 --  PLT 309 -- -- -- -- 324 -- --  APTT -- -- -- -- -- -- -- 29  LABPROT 26.7* -- -- -- -- -- -- 26.8*  INR 2.42* -- -- -- -- -- -- 2.43*  HEPARINUNFRC 0.55 -- 0.43 -- -- -- -- --  CREATININE 0.58 -- -- -- -- -- 0.80 --  CKTOTAL -- 50 -- 64 88 -- -- --  CKMB -- 1.5 -- 1.7 1.8 -- -- --  TROPONINI -- <0.30 -- <0.30 <0.30 -- -- --    Estimated Creatinine Clearance: 96.2 ml/min (by C-G formula based on Cr of 0.58).   Medical History: Past Medical History  Diagnosis Date  . Hypertension   . Cancer     nasal and throat    Medications:  Scheduled:     . antiseptic oral rinse  15 mL Mouth Rinse q12n4p  . chlorhexidine  15 mL Mouth Rinse BID  . dexamethasone  10 mg Intravenous Q6H  . diphenhydrAMINE  25 mg Intravenous Once  . pantoprazole (PROTONIX) IV  40 mg Intravenous QHS  . DISCONTD: dexamethasone  10 mg Intravenous Q6H  . DISCONTD: dexamethasone  6 mg Intravenous Q6H  . DISCONTD: dexamethasone  4 mg Oral Q12H  . DISCONTD: warfarin  5 mg Oral q1800  . 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 CCM  changed Coumadin to IV heparin for possible procedures on 6/12. INR 2.43 on admit 6/11 (last PM) ->> now 2.42. Last dose of Coumadin was 6/11.  Heparin level this am is 0.55 IU/ml and is within desired range. CBC wnl. No bleeding complications noted.  Goal of Therapy:  Heparin level 0.3-0.7 units/ml Monitor platelets by anticoagulation protocol: Yes   Plan:   Continue IV heparin at 1000 units/hr.  Follow-up Heparin level in the AM and titrate up as INR trends down.  Daily heparin level and CBC.    Link Snuffer, PharmD, BCPS Clinical Pharmacist [phone] Thank you for allowing pharmacy to be part of this patients care team. 09/12/2011,8:10 AM

## 2011-09-12 NOTE — Consult Note (Signed)
Admit date: 09/10/2011 Referring Physician  Dr. 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 He is currently undergoing radiation therapy here. His solid tumor doctor is at [facility].  Both he and his wife describe 4 separate episodes occurring at home of syncope/near syncope. All of them have similar prodromes where he will feel diaphoretic, pale, nauseous. Here in the hospital he had a similar episode earlier today and his blood pressure demonstrated a marked decrease to 60s systolic and prolonged bradycardia in the low to mid 40s at one time approaching 38. Atropine was administered with slow resolution of symptoms. Dopamine IV is currently at 7.5 mcg.  As stated previously, he has had left neck resection in the past and he notes that he has had some inflammation in that region possibly do to radiation therapy.  His current nurse does describe an episode where he was rushing his teeth with his heart rate in the 40s asymptomatic earlier today.  Currently, his heart rate is in the 70s, asymptomatic.  Echocardiogram is reassuring with normal EF, normal RV. Lower extremity Dopplers demonstrate bilateral DVTs. CT scan of chest does not demonstrate any evidence of pulmonary embolism.  PMH:   Past Medical History  Diagnosis Date  . Hypertension   . Cancer     nasal and throat    PSH:   Past Surgical History  Procedure Date  . Splectomy   . Tonsillectomy     one side   Allergies:  Morphine and related Prior to Admit Meds:   Prescriptions prior to admission  Medication Sig Dispense Refill  . dexamethasone (DECADRON) 4 MG tablet Take 4 mg by mouth 2 (two) times daily.       .  diphenhydrAMINE (BENADRYL) 25 MG tablet Take 25-50 mg by mouth daily.      Marland Kitchen HYDROcodone-acetaminophen (NORCO) 7.5-325 MG per tablet Take 1 tablet by mouth every 6 (six) hours as needed. For pain      . warfarin (COUMADIN) 5 MG tablet Take 5 mg by mouth daily.       Fam HX:   No family history on file. Social HX:    History   Social History  . Marital Status: Married    Spouse Name: N/A    Number of Children: N/A  . Years of Education: N/A   Occupational History  . Not on file.   Social History Main Topics  . Smoking status: Never Smoker   . Smokeless tobacco: Not on file  . Alcohol Use: No  . Drug Use:   . Sexually Active:    Other Topics Concern  . Not on file   Social History Narrative  . No narrative on file     ROS:  All 11 ROS were addressed and are negative except what is stated in the HPI  Physical Exam: Blood pressure 110/57, pulse 75, temperature 98.1 F (36.7 C), temperature source Oral, resp. rate 19, height 5\' 8"  (1.727 m), weight 80.4 kg (177 lb 4 oz), SpO2 97.00%.    General: Well developed, well nourished, in no acute distress Head: Squinting his left eye, No xanthomas.   Normal cephalic and atramatic. Neck with prior history of left neck resection.   Lungs:   Clear bilaterally to auscultation  and percussion. Normal respiratory effort. No wheezes, no rales. Heart:   HRRR S1 S2 Pulses are 2+ & equal. No murmurs.            No carotid bruit. No JVD.  No abdominal bruits.  Abdomen: Bowel sounds are positive, abdomen soft and non-tender without masses. Prior splenectomy scar noted Msk:  Back normal. Normal strength and tone for age. Extremities:   No clubbing, cyanosis or edema.  DP +1 Neuro: Alert and oriented X 3, non-focal, MAE x 4 GU: Deferred Rectal: Deferred Psych:  Good affect, responds appropriately    Labs:   Lab Results  Component Value Date   WBC 16.1* 09/12/2011   HGB 15.2 09/12/2011   HCT 42.5 09/12/2011   MCV 88.5 09/12/2011   PLT 309  09/12/2011    Lab 09/12/11 0446  NA 136  K 4.1  CL 103  CO2 23  BUN 17  CREATININE 0.58  CALCIUM 8.9  PROT --  BILITOT --  ALKPHOS --  ALT --  AST --  GLUCOSE 125*   No results found for this basename: PTT   Lab Results  Component Value Date   INR 2.42* 09/12/2011   INR 2.43* 09/10/2011   INR 1.05 08/30/2011   Lab Results  Component Value Date   CKTOTAL 50 09/11/2011   CKMB 1.5 09/11/2011   TROPONINI <0.30 09/11/2011     Lab Results  Component Value Date   CHOL 229* 08/29/2011   Lab Results  Component Value Date   HDL 34* 08/29/2011   Lab Results  Component Value Date   LDLCALC 166* 08/29/2011   Lab Results  Component Value Date   TRIG 143 08/29/2011   Lab Results  Component Value Date   CHOLHDL 6.7 08/29/2011   No results found for this basename: LDLDIRECT      Radiology:  Ct Soft Tissue Neck W Contrast  09/11/2011  *RADIOLOGY REPORT*  Clinical Data: Nasopharyngeal carcinoma.  CT NECK WITH CONTRAST  Technique:  Multidetector CT imaging of the neck was performed with intravenous contrast.  Contrast: OMNIPAQUE IOHEXOL 350 MG/ML SOLN  Comparison: PET scan 09/04/2011.  MR head 08/29/2011.  Findings: Large left posterior nasopharyngeal mass with extension across the midline, into the skull base, and laterally extending to the parapharyngeal and carotid space.  Exact measurement of the mass is difficult due to its pleomorphic nature.  At the level of the foramen magnum, cross-sectional measurements are 41 x 51 mm. At the level of C2, where the mass extends more laterally, the mass measures 24 x 84 mm.  Craniocaudal extent is approximately 55 mm. There is fullness in the left cavernous sinus which on previous MR represents intracranial extension.  There is slight fullness enhancement of the dura on the left at the foramen magnum level also suggesting intraspinal/intracranial extension.  Clear-cut osseous destruction of the skull base is not seen although MR demonstrates  skull base involvement.  The cavernous sinus extension may be via the foramen of Lacerum alongside the carotid artery.  Lateral extension of mass extends to the left parotid space deep lobe; (image 39 series 13) the mass is also contiguous with the pterygoids on the left.  There are surgical clips in the neck just above the thyroid gland on the left.  This may represent previous biopsy.  Bilateral hypermetabolic levels III and left level II lymph nodes as described on PET scan are redemonstrated. It is possible that the large parapharyngeal mass on the left at  the level of C2 represents a conglomerate mass of lymph nodes which is adjacent to the primary lesion.  The airway is not significantly compromised by this lesion.  The paranasal sinuses are clear.  Left mastoid effusion represents eustachian tube dysfunction.  Left internal carotid artery is surrounded by the tumor and narrowed.  (Image 40 series 13). Compared with prior PET scan and a most recent MR, the appearance is similar.  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 No significant progression compared with prior PET scan.  Original Report Authenticated By: Elsie Stain, M.D.   Ct Angio Chest W/cm &/or Wo Cm  09/11/2011  *RADIOLOGY REPORT*  Clinical Data: Bilateral DVT.  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  Syncopal episodes at home.Symptomatic bradycardia.  CT ANGIOGRAPHY CHEST  Technique:  Multidetector CT imaging of the chest using the standard protocol during bolus administration of intravenous contrast. Multiplanar reconstructed images including MIPs were obtained and reviewed to evaluate the vascular anatomy.  Contrast: OMNIPAQUE IOHEXOL 350 MG/ML SOLN  Comparison: None.  Findings: The heart is normal.  No enlargement, coronary calcification, or pericardial effusion.  No hilar or mediastinal lymphadenopathy.  Small right effusion with slight right lower lobe  subsegmental atelectasis. No pulmonary nodules are seen.  Good opacification of the pulmonary vasculature.  No evidence for pulmonary embolic disease.  Unremarkable aorta and visualized great vessels.  No upper abdominal pathology.  No acute osseous findings.  IMPRESSION: No evidence for pulmonary embolic disease.  Small right effusion and slight subsegmental atelectasis.  Original Report Authenticated By: Elsie Stain, M.D.   Dg Chest Port 1 View  09/11/2011  *RADIOLOGY REPORT*  Clinical Data: Line placement.  PORTABLE CHEST - 1 VIEW  Comparison: CT chest earlier this same day and plain film chest 09/10/2011.  Findings: New right IJ catheter is in place with the tip in the lower superior vena cava.  No pneumothorax.  Lungs are clear. Heart size is normal.  Surgical clips base of the neck on the left noted.  IMPRESSION: IJ catheter in good position.  No pneumothorax or other acute finding.  Original Report Authenticated By: Bernadene Bell. Maricela Curet, M.D.   Dg Chest Port 1 View  09/11/2011  *RADIOLOGY REPORT*  Clinical Data: Shortness of breath.  Recent diagnosis of nasal pharyngeal carcinoma.  PORTABLE CHEST - 1 VIEW  Comparison: 08/28/2011 and PET of 09/04/2011.  Findings: Surgical clips at the left side of the thoracic inlet. Midline trachea.  Normal heart size.  No pleural effusion or pneumothorax.  Clear lungs.  IMPRESSION: No acute cardiopulmonary disease.  Original Report Authenticated By: Consuello Bossier, M.D.   Personally viewed.  EKG:  Sinus bradycardia rate 50 with no ST segment changes, repeat EKG shows sinus rhythm rate 70. Telemetry demonstrates transient episodes of sinus bradycardia, no significant pauses. Personally viewed.   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  Bradycardia/syncope  - His episodes seem to correlate with a history of vasovagal syncope with classic  prodrome of nausea, diaphoresis, pallor and would also be consistent with his history of invading nasopharyngeal carcinoma possibly impinging carotid bulb. His episode responded to atropine earlier today. This would help blunt the vasovagal effect. Unfortunately, a permanent pacemaker would not prevent these episodes from occurring due to the underlying vasovagal reaction. This has been studied in VPS trial. The treatment is avoidance of these episodes which unfortunately may be very  challenging in his situation given his advancing cancer. Theoretically, prolonged radiation to this region is likely causing inflammation thereby causing impingement/excitation of carotid bulb/neurosensors. He is currently undergoing high-dose steroid therapy to hopefully reduce overall inflammation. IV dopamine is being utilized to help maintain blood pressure. EF is normal. Continue to make sure that he is adequately hydrated. Pulmonary embolism/RV strain has been ruled out with negative CT scan.  - Dr. Tyson Alias is undergoing further workup of his neck/head with MRI.   - We will continue to follow. If episode occurs again, please administer atropine. Please continue to try to wean dopamine. Aggressive hydration. Sodium to potassium ratio is not consistent with Addison's.  DVT  - Bilateral, chronic Coumadin.  Chronic anticoagulation  - INR therapeutic at 2.4.  Donato Schultz, MD  09/12/2011  1:29 PM

## 2011-09-12 NOTE — Progress Notes (Signed)
Name: Shane Marsh MRN: [id number] DOB: 1950/12/29    LOS: 2  Referring Provider:  EDP Reason for Referral:  Bradycardia / syncope   PULMONARY / CRITICAL CARE MEDICINE  Brief patient description:  61 y/o with nasopharyngeal carcinoma invading skull base and bilateral LE DVT on Coumadin admitted for bradycardia associated with dyspnea, hypotension and syncope.  Events Since Admission: 6/12  Admitted for bradycardia associated with dyspnea, hypotension and syncope 6/12- remains on pressors  Current Status: pressors  Vital Signs: Temp:  [97.8 F (36.6 C)-98.8 F (37.1 C)] 98.1 F (36.7 C) (06/13 0814) Pulse Rate:  [52-104] 61  (06/13 0800) Resp:  [11-20] 17  (06/13 0800) BP: (97-134)/(58-78) 120/72 mmHg (06/13 0800) SpO2:  [93 %-100 %] 96 % (06/13 0800)  Physical Examination: General: male sitting up in chair in no distress Neuro: Alert and oriented, Left eye with double vision HEENT:  Oropharynx unremarkable  Neck:  Supple, nolymphadenopathy Cardiovascular:  Sinus bradycardia on monitor, no murmurs Lungs:  CTAB Abdomen:  Soft, nontender, bowel sounds present Musculoskeletal:  Trace edema, lower extremities Skin:  Intact  Active Problems:  Syncope  Nasopharyngeal carcinoma  Bradycardia  Hypotension  DVT (deep venous thrombosis)  Coagulopathy  ASSESSMENT AND PLAN  PULMONARY No results found for this basename: PHART:5,PCO2:5,PCO2ART:5,PO2ART:5,HCO3:5,O2SAT:5 in the last 168 hours Ventilator Settings:   CXR:  6/11 >>> NAD VQ:  5/30 >>> Low probability for PE CT angio 6/12>> negative for PE ETT:  NA  A:  Episodic dyspnea associated with bradycardia / hypotension.  Recent VQ scan negative for PE and now CT.  Appropriately anticoagulated for DVT. P:   Supplemental oxygen Goal SpO2 > 92% CT reviewed  CARDIOVASCULAR  Lab 09/11/11 2200 09/11/11 1420 09/11/11 0412  TROPONINI <0.30 <0.30 <0.30  LATICACIDVEN -- -- --  PROBNP -- -- --   ECG:  6/11 >>> Sinus  bradycardia, no ST-T changes TTE:  5/30 >>> EF 55-60%, no wall motion abnormalities, normal valves Carotid Doppler:  5/30 >>> No significant ICA stenosis LE venous Doppler:  5/30 >>> Findings consistent with acute deep vein thrombosis involving the popliteal and peroneal veins of both the right and left lower extremity. ECHO 6/12>>> EF 65%, RV normal Lines: R IJ 6/12>>> CT neck 6/12: Large left posterior nasopharyngeal mass consistent with carcinoma. No significant progression compared with prior PET scan.  A: Symptomatic bradycardia associated with hypotension / syncope, suspect related to increased vagal tone related to skull base tumor invasion / carotid bulb?Marland Kitchen Meets criteria AI Etiology: parasympathetic / sympathtic invasion? Dry from vomiting contribution P:  Telemetry Dopamine gtt, goal HR > 60, MAP 65, attempt to wean dopamine , some slow improvements Decadron 10 q6 to reduce radiation neck edema / inflammation Dopamine max 7.5 mic, all alpha above Stress stereoids Rv wnl If remains sympro / brady in am after 2 days high dose roids, then MRI neck, invasion bulb?  RENAL  Lab 09/12/11 0446 09/10/11 2336  NA 136 140  K 4.1 4.6  CL 103 104  CO2 23 --  BUN 17 19  CREATININE 0.58 0.80  CALCIUM 8.9 --  MG 2.2 --  PHOS -- --   Intake/Output      06/12 0701 - 06/13 0700 06/13 0701 - 06/14 0700   I.V. (mL/kg) 2184.6 (27.2) 91.8 (1.1)   IV Piggyback 16    Total Intake(mL/kg) 2200.6 (27.4) 91.8 (1.1)   Urine (mL/kg/hr) 1830 (0.9)    Total Output 1830    Net +370.6 +91.8  Foley:  NA  A:  No active issues P:   Maintenance IVF NS 75 mL/h, kvo cvp pending, may change plan  GASTROINTESTINAL No results found for this basename: AST:5,ALT:5,ALKPHOS:5,BILITOT:5,PROT:5,ALBUMIN:5 in the last 168 hours  A:  N / vom P:   Add diet if can tolerate with nausea  zofran steroids  HEMATOLOGIC  Lab 09/12/11 0446 09/11/11 0030 09/10/11 2336 09/10/11 2329  HGB 15.2 15.0  14.6 --  HCT 42.5 41.9 43.0 --  PLT 309 324 -- --  INR 2.42* -- -- 2.43*  APTT -- -- -- 29   A:  Coumadin induced coagulopathy.  Appropriate INR. P:  Trend CBC Heparin drip per pharm, until no temp wire etc needs  INFECTIOUS  Lab 09/12/11 0446 09/11/11 0030  WBC 16.1* 17.7*  PROCALCITON -- --   Cultures: NA Antibiotics: NA  A:  Steroid induced leukocytosis.  No signs of infection.  History of splenectomy. P:   Trend CBC / temp CT no absces  ENDOCRINE No results found for this basename: GLUCAP:5 in the last 168 hours A:  Presum  Rel AI P:   roids same dose x 24 hrs  NEUROLOGIC  Brain MRI:  5/30 >>> Left para pharyngeal mass is compatible with carcinoma. There is invasion of the skull base and suggestion of mild progression since the recent MRI. There is a left mastoid sinus effusion due to obstruction of the eustachian tube. Correlation with biopsy is suggested.  A:  Syncope, likely secondary to bradycardia / hypotension.  Nasopharyngeal carcinoma with skull base invasion.  Followed at [facility].  First radiation treatment on 6/11.  Chemotherapy planned but has not been started. At risk met csf, icp increase - brady P:   Decadron remain Oxycodone PRN pain No clinical findings to suggest csf  / icp issues I spoke to Dr Darlin Priestly, rad onc, fully updated she wants to limit time in between planned radiation. When brady resolved etc, will opt for immediate radiation , even at [facility]? I called [facility], unavailable, will update today if he is available  BEST PRACTICE / DISPOSITION Level of Care:  ICU Primary Service:  PCCM Consultants: Cards? Code Status:  Full Diet:  NPO except Rx DVT Px:  Heparin drip  GI Px:  Protonix Skin Integrity:  Intact Social / Family:  Updated  Ccm 30 min   Mcarthur Rossetti. Tyson Alias, MD, FACP Pgr: [phone] [facility]

## 2011-09-12 NOTE — Consult Note (Signed)
[facility]  [phone] [phone]) [phone]   MEDICAL ONCOLOGY - INITIAL CONSULATION    Referral MD:   Dr. Lonie Peak, M.D.  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   HPI:  [name] is a 61 year-old man with history of smoking.   He was diagnosed with left tonsilar cancer in 2011.  He had T2 N2b M0 disease.  He underwent robotic resection with Dr. Hezzie Bump at [facility] in addition to left neck dissection.  He was recommended to receive adjuvant radiation therapy.  However, he declined at that time.  He has been in usual state of health until May 2013 when he was diagnosed with Dr. Hezzie Bump with recurrent disease when he presented with diplopia and left sided headache.  He was deemed not a candidate for curative resection.  He saw Dr. Basilio Cairo and Dr. Laurita Quint at [place] with plan receive concurrent chemoradiation.  He had already had 2 daily sessions of radiation with plan to start concurrent cisplatin in the near future.  He developed recurrent syncope over the last few weeks.  Work up at past admission was non conclusive however did find that he had bilateral lower extremity DVT on 08/29/11.  He developed syncope again today with bradycardia and hypotension.  Thus, he was admitted to [facility] ICU.   I visited patient and his wife in the ICU tonight.  With dopamine, his BP and HR have normalized.  He still complained of diplopia, left sided headache.  He felt that with 2 sessions of radiation, he felt some swelling in the left cervical neck.  He has had mild fatigue from the recurrent syncope.  But up until May 2013, he was working as an Personnel officer and was totally independent of activities of daily living.   Patient denies confusion, drenching night sweats, palpable lymph node swelling, mucositis, odynophagia, dysphagia, nausea vomiting, jaundice, chest pain, palpitation, shortness of breath, dyspnea on exertion,  productive cough, gum bleeding, epistaxis, hematemesis, hemoptysis, abdominal pain, abdominal swelling, early satiety, melena, hematochezia, hematuria, skin rash, spontaneous bleeding, joint swelling, joint pain, heat or cold intolerance, bowel bladder incontinence, back pain, focal motor weakness, paresthesia, depression.      Past Medical History  Diagnosis Date  . Hypertension   . Cancer     nasal and throat  :  Past Surgical History  Procedure Date  . Splectomy   . Tonsillectomy     one side  :  Current Facility-Administered Medications  Medication Dose Route Frequency Provider Last Rate Last Dose  . 0.9 %  sodium chloride infusion   Intravenous Continuous Simonne Martinet, NP 40 mL/hr at 09/12/11 2200    . acetaminophen (TYLENOL) tablet 650 mg  650 mg Oral Q6H PRN Lonia Farber, MD   650 mg at 09/12/11 0040  . ALPRAZolam Prudy Feeler) tablet 0.25 mg  0.25 mg Oral Once Simonne Martinet, NP   0.25 mg at 09/12/11 2237  . antiseptic oral rinse (BIOTENE) solution 15 mL  15 mL Mouth Rinse q12n4p Lonia Farber, MD   15 mL at 09/12/11 1738  . atropine 0.1 MG/ML injection 1 mg  1 mg Intravenous Once Simonne Martinet, NP   0.5 mg at 09/12/11 1220  . atropine 1 MG/ML injection           . atropine 1 MG/ML injection           . chlorhexidine (PERIDEX) 0.12 % solution 15 mL  15 mL  Mouth Rinse BID Lonia Farber, MD   15 mL at 09/12/11 2000  . dexamethasone (DECADRON) injection 10 mg  10 mg Intravenous Q6H Nelda Bucks, MD   10 mg at 09/12/11 1728  . diphenhydrAMINE (BENADRYL) injection 25 mg  25 mg Intravenous Once Coralyn Helling, MD   25 mg at 09/12/11 0655  . DOPamine (INTROPIN) 800 mg in dextrose 5 % 250 mL infusion  3-10 mcg/kg/min Intravenous Titrated Simonne Martinet, NP 11.3 mL/hr at 09/12/11 1300 7.5 mcg/kg/min at 09/12/11 1300  . heparin ADULT infusion 100 units/mL (25000 units/250 mL)  1,000 Units/hr Intravenous Continuous Fayne Norrie, PHARMD 10  mL/hr at 09/12/11 1738 1,000 Units/hr at 09/12/11 1738  . ondansetron (ZOFRAN) injection 4 mg  4 mg Intravenous Q6H PRN Simonne Martinet, NP   4 mg at 09/12/11 1230  . oxyCODONE (Oxy IR/ROXICODONE) immediate release tablet 5 mg  5 mg Oral Q4H PRN Lonia Farber, MD      . pantoprazole (PROTONIX) injection 40 mg  40 mg Intravenous QHS Lonia Farber, MD   40 mg at 09/12/11 2221     Allergies  Allergen Reactions  . Morphine And Related Nausea And Vomiting  :  No family history on file.:  History   Social History  . Marital Status: Married    Spouse Name: N/A    Number of Children: N/A  . Years of Education: N/A   Occupational History  . Not on file.   Social History Main Topics  . Smoking status: Never Smoker   . Smokeless tobacco: Not on file  . Alcohol Use: No  . Drug Use:   . Sexually Active:    Other Topics Concern  . Not on file   Social History Narrative  . No narrative on file  :  Pertinent items are noted in HPI.  Exam: Patient Vitals for the past 24 hrs:  BP Temp Temp src Pulse Resp SpO2  09/12/11 2000 - 98.1 F (36.7 C) Oral - - -  09/12/11 1900 110/58 mmHg - - 60  21  95 %  09/12/11 1800 119/62 mmHg - - 66  18  99 %  09/12/11 1730 120/65 mmHg - - 64  18  93 %  09/12/11 1715 116/60 mmHg - - 64  19  92 %  09/12/11 1700 111/59 mmHg - - 67  18  93 %  09/12/11 1645 113/63 mmHg - - 62  18  95 %  09/12/11 1630 106/66 mmHg - - 58  18  95 %  09/12/11 1615 86/60 mmHg - - 65  17  98 %  09/12/11 1600 114/66 mmHg - - 73  20  98 %  09/12/11 1541 - 98.5 F (36.9 C) Oral - - -  09/12/11 1515 118/62 mmHg - - 71  18  98 %  09/12/11 1500 118/68 mmHg - - 76  17  100 %  09/12/11 1445 119/61 mmHg - - 74  20  96 %  09/12/11 1430 122/69 mmHg - - 72  17  98 %  09/12/11 1415 108/57 mmHg - - 68  21  98 %  09/12/11 1400 124/67 mmHg - - 87  18  97 %  09/12/11 1345 123/65 mmHg - - 71  25  98 %  09/12/11 1330 111/65 mmHg - - 71  18  100 %  09/12/11 1315  114/65 mmHg - - 78  20  99 %  09/12/11 1300 114/66 mmHg - -  72  15  100 %  09/12/11 1245 141/76 mmHg - - 84  18  99 %  09/12/11 1230 123/83 mmHg - - 109  28  98 %  09/12/11 1225 112/77 mmHg - - - 21  -  09/12/11 1222 43/21 mmHg - - - 18  -  09/12/11 1220 58/30 mmHg - - - 21  -  09/12/11 1215 84/39 mmHg - - - 20  -  09/12/11 1207 - 98.1 F (36.7 C) Oral - - -  09/12/11 1200 110/57 mmHg - - - 19  -  09/12/11 1100 90/66 mmHg - - - 20  -  09/12/11 1000 95/59 mmHg - - 75  17  97 %  09/12/11 0900 111/70 mmHg - - 86  17  98 %  09/12/11 0814 - 98.1 F (36.7 C) Oral - - -  09/12/11 0800 120/72 mmHg - - 61  17  96 %  09/12/11 0740 - - - 80  15  96 %  09/12/11 0700 108/68 mmHg - - 52  16  96 %  09/12/11 0600 114/67 mmHg - - 89  20  97 %  09/12/11 0500 109/59 mmHg - - 54  15  96 %  09/12/11 0400 109/65 mmHg - - 52  15  96 %  09/12/11 0353 - 97.9 F (36.6 C) Oral - - -  09/12/11 0300 105/71 mmHg - - 104  15  97 %  09/12/11 0200 117/77 mmHg - - 67  15  96 %  09/12/11 0100 117/76 mmHg - - 57  15  99 %  09/12/11 0000 116/65 mmHg - - 55  16  95 %  09/11/11 2349 - 98 F (36.7 C) Oral - - -   ECOG 1.   General:  well-nourished man in no acute distress.  Eyes:  no scleral icterus.  ENT:  There were no oropharyngeal lesions on my unaided exam.  Neck was without thyromegaly.  There was fullness in the left cervical neck.  I did not massage it; but was not able to feel any discreet mass.  Lymphatics:  Negative cervical, supraclavicular or axillary adenopathy.  Respiratory: lungs were clear bilaterally without wheezing or crackles.  Cardiovascular:  Regular rate and rhythm, S1/S2, without murmur, rub or gallop.  There was no pedal edema.  GI:  abdomen was soft, flat, nontender, nondistended, without organomegaly.  Muscoloskeletal:  no spinal tenderness of palpation of vertebral spine.  Skin exam was without echymosis, petichae.  Neuro exam showed left eye not able to adduct laterally.  His tongue deviated  to the right and uvela to the left.   Patient was able to get sit up in bed without assistance.   Patient was alerted and oriented x 4.  Attention was good.   Language was appropriate.  Mood was normal without depression.  Speech was not pressured.  Thought content was not tangential.     Lab Results  Component Value Date   WBC 16.1* 09/12/2011   HGB 15.2 09/12/2011   HCT 42.5 09/12/2011   PLT 309 09/12/2011   GLUCOSE 125* 09/12/2011   CHOL 229* 08/29/2011   TRIG 143 08/29/2011   HDL 34* 08/29/2011   LDLCALC 166* 08/29/2011   ALT 12 08/29/2011   AST 14 08/29/2011   NA 136 09/12/2011   K 4.1 09/12/2011   CL 103 09/12/2011   CREATININE 0.58 09/12/2011   BUN 17 09/12/2011   CO2 23 09/12/2011   INR 2.42*  09/12/2011   HGBA1C 5.9* 08/28/2011    Ct Soft Tissue Neck W Contrast  09/11/2011  *RADIOLOGY REPORT*  Clinical Data: Nasopharyngeal carcinoma.  CT NECK WITH CONTRAST  Technique:  Multidetector CT imaging of the neck was performed with intravenous contrast.  Contrast: OMNIPAQUE IOHEXOL 350 MG/ML SOLN  Comparison: PET scan 09/04/2011.  MR head 08/29/2011.  Findings: Large left posterior nasopharyngeal mass with extension across the midline, into the skull base, and laterally extending to the parapharyngeal and carotid space.  Exact measurement of the mass is difficult due to its pleomorphic nature.  At the level of the foramen magnum, cross-sectional measurements are 41 x 51 mm. At the level of C2, where the mass extends more laterally, the mass measures 24 x 84 mm.  Craniocaudal extent is approximately 55 mm. There is fullness in the left cavernous sinus which on previous MR represents intracranial extension.  There is slight fullness enhancement of the dura on the left at the foramen magnum level also suggesting intraspinal/intracranial extension.  Clear-cut osseous destruction of the skull base is not seen although MR demonstrates skull base involvement.  The cavernous sinus extension may be via the  foramen of Lacerum alongside the carotid artery.  Lateral extension of mass extends to the left parotid space deep lobe; (image 39 series 13) the mass is also contiguous with the pterygoids on the left.  There are surgical clips in the neck just above the thyroid gland on the left.  This may represent previous biopsy.  Bilateral hypermetabolic levels III and left level II lymph nodes as described on PET scan are redemonstrated. It is possible that the large parapharyngeal mass on the left at the level of C2 represents a conglomerate mass of lymph nodes which is adjacent to the primary lesion.  The airway is not significantly compromised by this lesion.  The paranasal sinuses are clear.  Left mastoid effusion represents eustachian tube dysfunction.  Left internal carotid artery is surrounded by the tumor and narrowed.  (Image 40 series 13). Compared with prior PET scan and a most recent MR, the appearance is similar.  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 No significant progression compared with prior PET scan.  Original Report Authenticated By: Elsie Stain, M.D.   Mri Brain Without Contrast  08/29/2011  *RADIOLOGY REPORT*  Clinical Data: Back pain.  History of tonsillar carcinoma.  MRI HEAD WITHOUT CONTRAST  Technique:  Multiplanar, multiecho pulse sequences of the brain and surrounding structures were obtained according to standard protocol without intravenous contrast.  Comparison: MRI 08/05/2011  Findings: Negative for acute infarct.  Several old small white matter hyperintensities in the frontal white matter are unchanged most likely due to chronic microvascular ischemia.  Mild atrophy is present.  Ventricle size is normal.  Infiltrating mass lesion in the left neck is again identified. This is  surrounding the left internal carotid artery and extending posterior to the cervical carotid artery.  Mass extends to the para pharyngeal soft tissues and infiltrates the  longus coli muscle and extends into the clivus.  There is thickening of the dura suggesting some epidural tumor.  There is also extension into the occipital bone on the left at the foramen magnum.  There may be some progression of tumor in the skull base since the prior MRI. The primary mass measures 3.9 x 3.0 cm and may have progressed slightly in the interval.  There is a mastoid sinus effusion on the left due to obstruction of  the eustachian tube.  IMPRESSION: Negative for acute infarct.  Left para pharyngeal mass is compatible with carcinoma.  There is invasion of the  skull base and suggestion of mild progression since the recent MRI.  There is a left mastoid sinus effusion due to obstruction of the eustachian tube.  Correlation with biopsy is suggested.  Original Report Authenticated By: Camelia Phenes, M.D.    Nm Pet Image Initial (pi) Skull Base To Thigh  09/04/2011  *RADIOLOGY REPORT*  Clinical Data: Initial treatment strategy for nasopharyngeal carcinoma.  NUCLEAR MEDICINE PET SKULL BASE TO THIGH  Fasting Blood Glucose:  96  Technique:  17 mCi F-18 FDG was injected intravenously. CT data was obtained and used for attenuation correction and anatomic localization only.  (This was not acquired as a diagnostic CT examination.) Additional exam technical data entered on technologist worksheet.  Comparison:  08/29/2011  Findings:  Neck: Again identified is a large left parapharyngeal mass which is intensely hypermetabolic compatible with carcinoma.  This measures 2.5 x 6.6 by 6.6 cm and invades the skull base.  The SUV max associated this mass is equal to that 16.4.  Bilateral hypermetabolic cervical adenopathy is identified.  Index left level III lymph node measures 0.1 cm and has an SUV max equal to the 6.4, image 39.  Right level III node measures 1.5 cm and has an SUV max equal to 6.1.  Chest:  No hypermetabolic mediastinal or hilar nodes.  No suspicious pulmonary nodules on the CT scan.  Abdomen/Pelvis:   No abnormal hypermetabolic activity within the liver, pancreas, adrenal glands, or spleen.  No hypermetabolic lymph nodes in the abdomen or pelvis.  There is a soft tissue attenuating structure within the left upper quadrant of the abdomen is identified measuring 4.1 x 5.4 cm.  Likely splenule.  Advanced calcified atherosclerotic disease affects the abdominal aorta and its branches.  The left common iliac artery measures 2.3 cm in diameter.  [place]:  No focal hypermetabolic activity to suggest skeletal metastasis.  IMPRESSION:  1.  Large left-sided parapharyngeal mass invading the skull base consistent with carcinoma. 2.  Bilateral cervical lymph node hypermetabolic adenopathy.  Original Report Authenticated By: Rosealee Albee, M.D.   IMPRESSION:   1.  History of left tonsilar SCC; s/p resection and neck dissection in 2011.  He did not receive adjuvant radiation as recommended.  2.  Recent bilateral lower extremity DVT.  3.  Most likely recurrent SCC now with extension into the left parapharynx, extending to left carotid, intracranial invasion, causing cranial nerve deficit, and also carotid sinus causing recurrent syncope and now with bradycardia, hypotension.  ASSESSMENT:   I had a long discussion with Dr. Basilio Cairo and then with patient and his wife.  He has grave prognosis.  As he has recurrent disease, and not resectable with very locally advanced extension of his disease, the chance of cure is not very high.  However, he still does not have distant met.  Dr. Basilio Cairo is hopeful that her radiation planning will be able to encompass the involved extension and at the same time sparing the brain/spinal cord.  Both Dr. Basilio Cairo and I agreed that due to the extension of the disease and recurrent syncope with carotid sinus involvement, maybe induction chemotherapy is an option.    Randomized trials did not definitively show improved survival with induction chemo followed by concurrent chemorad compared to  chemorad alone.  However, the trials did not improvement in distant met which Mr. Jelinski is at very  high risk of having in the near future.  Induction chemo include Taxtore (75mg /m2 x 1), Cisplatin 75mg /m2/ x 1; 5FU continuous infusion from d1-5 (or 120 hours) at 750mg /m2/day.  Each cycle is 3 weeks long.  It can potentially shrink down the tumor away from the cranial nerves and carotid sinus faster than radiation.    This chemoregimen has side effects which include but not limited to alopecia, mucositis, nausea vomiting, cytopenia, infection, bleeding, fatigue, diarrhea, chest pain, skin rash, neuropathy, infusion reaction, pneumonitis, hearing loss, nephropathy, hand-foot syndrome, nail loss.  The patient and his wife expressed informed understanding wished to pursue this induction chemotherapy as soon a distress but was a long tomorrow.  I appreciate ICU support with Dobutamine.  I agree with Heparin IV gtt protocol for recent DVT.

## 2011-09-12 NOTE — Procedures (Signed)
Central Venous Catheter Insertion Procedure Note- intorducer, pacer may be needed Shane Marsh [id number] 1950-10-13  Procedure: Insertion of Central Venous Catheter Indications: in case emert pacer needed  Procedure Details Consent: Risks of procedure as well as the alternatives and risks of each were explained to the (patient/caregiver).  Consent for procedure obtained. Time Out: Verified patient identification, verified procedure, site/side was marked, verified correct patient position, special equipment/implants available, medications/allergies/relevent history reviewed, required imaging and test results available.  Performed  Maximum sterile technique was used including antiseptics, cap, gloves, gown, hand hygiene, mask and sheet. Skin prep: Chlorhexidine; local anesthetic administered A antimicrobial bonded/coated single lumen catheter was placed in the right femoral vein due to inr 2.4, avoid subccla, cancer left  using the Seldinger technique.  Evaluation Blood flow good Complications: No apparent complications Patient did tolerate procedure well. Chest X-ray ordered to verify placement.  CXR: normal.  Shane Marsh J. 09/12/2011, 1:03 PM  Mcarthur Rossetti. Tyson Alias, MD, FACP Pgr: [phone] [facility]

## 2011-09-12 NOTE — Plan of Care (Signed)
Problem: Phase I Progression Outcomes Goal: Other Phase I Outcomes/Goals Outcome: Completed/Met Date Met:  09/12/11 Introduced Patient and family to [facility]

## 2011-09-12 NOTE — Progress Notes (Signed)
Data: Patient demonstrates extreme mood swings and irritability after learning from doctors the difficulties his treatment plan presents and uncertainty in the outcome at this point in time.  [employer] MD Tyson Alias of of poor coping and requested pharmaceutical intervention as PRN option.    Gave patient time to calm down and engaged him in conversation regarding coping during this time.  I offered suggestions of reading, tv, or to allow me to take him outside with the MDs permission.   I also informed him that there was a medication available for him that can work as a "nerve pill" to take the edge off.

## 2011-09-12 NOTE — Progress Notes (Signed)
Chaplain Note:  Chaplain visited with pt and pt's wife.  Pt was resting in bed.  Pt's wife was seated at bedside. Prior to visit, pt's nurse commented that pt had been anxious about his diagnosis and course of treatment.  Pt stated that he had visited with his clergyperson and that their conversation had been beneficial. Chaplain provided spiritual comfort and support for pt and pt's family.  Both expressed appreciation for chaplain support.  Chaplain debriefed with pt's nurse following visit. Chaplain will follow up as needed.  09/12/11 1540  Clinical Encounter Type  Visited With Patient and family together  Visit Type Initial;Spiritual support  Referral From Nurse  Spiritual Encounters  Spiritual Needs Emotional  Stress Factors  Patient Stress Factors Loss of control;Major life changes;Health changes  Family Stress Factors Major life changes;Loss of control   Verdie Shire, chaplain resident [phone]

## 2011-09-13 ENCOUNTER — Telehealth: Payer: Self-pay | Admitting: *Deleted

## 2011-09-13 DIAGNOSIS — I82409 Acute embolism and thrombosis of unspecified deep veins of unspecified lower extremity: Secondary | ICD-10-CM

## 2011-09-13 DIAGNOSIS — D72829 Elevated white blood cell count, unspecified: Secondary | ICD-10-CM

## 2011-09-13 LAB — URINALYSIS, ROUTINE W REFLEX MICROSCOPIC
Bilirubin Urine: NEGATIVE
Glucose, UA: NEGATIVE mg/dL
Ketones, ur: NEGATIVE mg/dL
Nitrite: NEGATIVE
pH: 6 (ref 5.0–8.0)

## 2011-09-13 LAB — GLUCOSE, CAPILLARY
Glucose-Capillary: 142 mg/dL — ABNORMAL HIGH (ref 70–99)
Glucose-Capillary: 108 mg/dL — ABNORMAL HIGH (ref 70–99)
Glucose-Capillary: 150 mg/dL — ABNORMAL HIGH (ref 70–99)

## 2011-09-13 LAB — CBC
MCH: 31.6 pg (ref 26.0–34.0)
MCV: 89 fL (ref 78.0–100.0)
Platelets: 315 10*3/uL (ref 150–400)
RBC: 4.71 MIL/uL (ref 4.22–5.81)
RDW: 13.2 % (ref 11.5–15.5)

## 2011-09-13 LAB — BASIC METABOLIC PANEL
BUN: 24 mg/dL — ABNORMAL HIGH (ref 6–23)
Chloride: 103 mEq/L (ref 96–112)
Creatinine, Ser: 0.82 mg/dL (ref 0.50–1.35)
GFR calc Af Amer: >90 mL/min (ref >90)

## 2011-09-13 LAB — HEPARIN LEVEL (UNFRACTIONATED): Heparin Unfractionated: 0.59 IU/mL (ref 0.30–0.70)

## 2011-09-13 MED ORDER — DEXAMETHASONE SODIUM PHOSPHATE 10 MG/ML IJ SOLN
6.0000 mg | Freq: Four times a day (QID) | INTRAMUSCULAR | Status: DC
  Administered 2011-09-13 – 2011-09-16 (×12): 6 mg via INTRAVENOUS
  Filled 2011-09-13 (×18): qty 0.6

## 2011-09-13 MED ORDER — DIPHENHYDRAMINE HCL 50 MG/ML IJ SOLN: 50.0000 mg | Freq: Once | INTRAMUSCULAR | Status: AC | PRN

## 2011-09-13 MED ORDER — DOCETAXEL CHEMO INJECTION 160 MG/16ML
75.0000 mg/m2 | Freq: Once | INTRAVENOUS | Status: AC
  Administered 2011-09-13: 150 mg via INTRAVENOUS
  Filled 2011-09-13: qty 15

## 2011-09-13 MED ORDER — ALTEPLASE 2 MG IJ SOLR
2.0000 mg | Freq: Once | INTRAMUSCULAR | Status: AC | PRN
  Filled 2011-09-13: qty 2

## 2011-09-13 MED ORDER — SODIUM CHLORIDE 0.9 % IV SOLN: Freq: Once | INTRAVENOUS | Status: AC | PRN

## 2011-09-13 MED ORDER — EPINEPHRINE HCL 0.1 MG/ML IJ SOLN: 0.2500 mg | Freq: Once | INTRAMUSCULAR | Status: AC | PRN

## 2011-09-13 MED ORDER — SODIUM CHLORIDE 0.9 % IV SOLN
INTRAVENOUS | Status: DC
  Administered 2011-09-13: 20 mL/h via INTRAVENOUS
  Administered 2011-09-13: 10:00:00 via INTRAVENOUS
  Administered 2011-09-13: 20 mL/h via INTRAVENOUS
  Administered 2011-09-14 – 2011-09-15 (×2): via INTRAVENOUS
  Administered 2011-09-16: 1000 mL via INTRAVENOUS
  Administered 2011-09-17: 10:00:00 via INTRAVENOUS
  Administered 2011-09-18: 500 mL via INTRAVENOUS

## 2011-09-13 MED ORDER — ALPRAZOLAM 0.25 MG PO TABS
0.2500 mg | ORAL_TABLET | Freq: Two times a day (BID) | ORAL | Status: DC | PRN
  Administered 2011-09-16: 0.25 mg via ORAL
  Filled 2011-09-13: qty 1

## 2011-09-13 MED ORDER — DEXAMETHASONE SODIUM PHOSPHATE 4 MG/ML IJ SOLN
12.0000 mg | Freq: Once | INTRAMUSCULAR | Status: DC
  Filled 2011-09-13: qty 3

## 2011-09-13 MED ORDER — SODIUM CHLORIDE 0.9 % IV SOLN
Freq: Once | INTRAVENOUS | Status: AC
  Administered 2011-09-13: 20 mL/h via INTRAVENOUS

## 2011-09-13 MED ORDER — COLD PACK MISC ONCOLOGY
1.0000 | Freq: Once | Status: AC | PRN
  Filled 2011-09-13: qty 1

## 2011-09-13 MED ORDER — POTASSIUM CHLORIDE 2 MEQ/ML IV SOLN
Freq: Once | INTRAVENOUS | Status: AC
  Administered 2011-09-13: 14:00:00 via INTRAVENOUS
  Filled 2011-09-13: qty 10

## 2011-09-13 MED ORDER — PANTOPRAZOLE SODIUM 40 MG PO TBEC
40.0000 mg | DELAYED_RELEASE_TABLET | Freq: Every day | ORAL | Status: DC
  Administered 2011-09-13 – 2011-09-19 (×7): 40 mg via ORAL
  Filled 2011-09-13 (×9): qty 1

## 2011-09-13 MED ORDER — METHYLPREDNISOLONE SODIUM SUCC 125 MG IJ SOLR: 125.0000 mg | Freq: Once | INTRAMUSCULAR | Status: AC | PRN

## 2011-09-13 MED ORDER — DIPHENHYDRAMINE HCL 50 MG/ML IJ SOLN: 25.0000 mg | Freq: Once | INTRAMUSCULAR | Status: AC | PRN

## 2011-09-13 MED ORDER — EPINEPHRINE HCL 1 MG/ML IJ SOLN
0.5000 mg | Freq: Once | INTRAMUSCULAR | Status: AC | PRN
  Filled 2011-09-13: qty 1

## 2011-09-13 MED ORDER — SODIUM CHLORIDE 0.9 % IV SOLN
150.0000 mg | Freq: Once | INTRAVENOUS | Status: AC
  Administered 2011-09-13: 150 mg via INTRAVENOUS
  Filled 2011-09-13: qty 5

## 2011-09-13 MED ORDER — PALONOSETRON HCL INJECTION 0.25 MG/5ML
0.2500 mg | Freq: Once | INTRAVENOUS | Status: AC
  Administered 2011-09-13: 0.25 mg via INTRAVENOUS
  Filled 2011-09-13: qty 5

## 2011-09-13 MED ORDER — SODIUM CHLORIDE 0.9 % IJ SOLN
10.0000 mL | INTRAMUSCULAR | Status: DC | PRN
  Administered 2011-09-19: 20 mL
  Administered 2011-09-20: 10 mL

## 2011-09-13 MED ORDER — HEPARIN SOD (PORK) LOCK FLUSH 100 UNIT/ML IV SOLN
500.0000 [IU] | Freq: Once | INTRAVENOUS | Status: AC | PRN
  Filled 2011-09-13: qty 5

## 2011-09-13 MED ORDER — SODIUM CHLORIDE 0.9 % IJ SOLN: 3.0000 mL | INTRAMUSCULAR | Status: DC | PRN

## 2011-09-13 MED ORDER — INSULIN ASPART 100 UNIT/ML ~~LOC~~ SOLN
0.0000 [IU] | Freq: Three times a day (TID) | SUBCUTANEOUS | Status: DC
  Administered 2011-09-13 – 2011-09-14 (×2): 1 [IU] via SUBCUTANEOUS

## 2011-09-13 MED ORDER — SODIUM CHLORIDE 0.9 % IV SOLN
750.0000 mg/m2/d | INTRAVENOUS | Status: AC
  Administered 2011-09-13 – 2011-09-17 (×5): 1450 mg via INTRAVENOUS
  Filled 2011-09-13 (×5): qty 29

## 2011-09-13 MED ORDER — FAMOTIDINE IN NACL 20-0.9 MG/50ML-% IV SOLN
20.0000 mg | Freq: Once | INTRAVENOUS | Status: AC | PRN
  Filled 2011-09-13: qty 50

## 2011-09-13 MED ORDER — ALBUTEROL SULFATE (2.5 MG/3ML) 0.083% IN NEBU
2.5000 mg | INHALATION_SOLUTION | Freq: Once | RESPIRATORY_TRACT | Status: AC | PRN
  Filled 2011-09-13: qty 3

## 2011-09-13 MED ORDER — SODIUM CHLORIDE 0.9 % IV SOLN
75.0000 mg/m2 | Freq: Once | INTRAVENOUS | Status: AC
  Administered 2011-09-13: 147 mg via INTRAVENOUS
  Filled 2011-09-13: qty 147

## 2011-09-13 MED ORDER — SODIUM CHLORIDE 0.9 % IV BOLUS (SEPSIS)
750.0000 mL | Freq: Once | INTRAVENOUS | Status: AC
  Administered 2011-09-13: 750 mL via INTRAVENOUS

## 2011-09-13 MED ORDER — HEPARIN SOD (PORK) LOCK FLUSH 100 UNIT/ML IV SOLN
250.0000 [IU] | Freq: Once | INTRAVENOUS | Status: AC | PRN
  Filled 2011-09-13: qty 3

## 2011-09-13 NOTE — Progress Notes (Signed)
IV team required order for patient to receive chemo with a WBC 24.1. Paged Dr. Gaylyn Rong and received verbal consent to administer chemo.

## 2011-09-13 NOTE — Progress Notes (Signed)
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  Events Since Admission: 6/12  Admitted for bradycardia associated with dyspnea, hypotension and syncope 6/12- remains on pressors 6/13- remains on dop 7.5  Current Status: pressors required  Vital Signs: Temp:  [97.8 F (36.6 C)-98.7 F (37.1 C)] 97.8 F (36.6 C) (06/14 0755) Pulse Rate:  [50-109] 52  (06/14 0800) Resp:  [15-28] 16  (06/14 0800) BP: (43-141)/(21-83) 112/56 mmHg (06/14 0800) SpO2:  [89 %-100 %] 92 % (06/14 0800)  Physical Examination: General: no distress Neuro: Alert and oriented, Left eye with double vision intermitent HEENT:  Oropharynx edema left tonsillar erea Neck:  Supple, nolymphadenopathy Cardiovascular:  Sinus bradycardia on monitor, no murmurs s1 s2  Lungs:  CTAB Abdomen:  Soft, nontender, bowel sounds present Musculoskeletal:  NO edema, lower extremities Skin:  Intact  Active Problems:  Syncope  Nasopharyngeal carcinoma  Bradycardia  Hypotension  DVT (deep venous thrombosis)  Coagulopathy  ASSESSMENT AND PLAN  PULMONARY  Lab 09/12/11 1234  PHART 7.603*  PCO2ART 21.0*  PO2ART 126.0*  HCO3 20.8  O2SAT 99.0   Ventilator Settings:   CXR:  6/11 >>> NAD VQ:  5/30 >>> Low probability for PE CT angio 6/12>> negative for PE ETT:  NA  A:  Episodic dyspnea associated with bradycardia / hypotension.  Recent VQ scan negative for PE and now CT.  Appropriately anticoagulated for DVT. P:   Supplemental oxygen Goal SpO2 > 92% pcxr in  am , will give fluids today, eval edema in am  CARDIOVASCULAR  Lab 09/11/11 2200 09/11/11 1420 09/11/11 0412  TROPONINI <0.30 <0.30 <0.30    LATICACIDVEN -- -- --  PROBNP -- -- --   ECG:  6/11 >>> Sinus bradycardia, no ST-T changes TTE:  5/30 >>> EF 55-60%, no wall motion abnormalities, normal valves Carotid Doppler:  5/30 >>> No significant ICA stenosis LE venous Doppler:  5/30 >>> Findings consistent with acute deep vein thrombosis involving the popliteal and peroneal veins of both the right and left lower extremity. ECHO 6/12>>> EF 65%, RV normal Lines: R IJ 6/12>>> CT neck 6/12: Large left posterior nasopharyngeal mass consistent with carcinoma. No significant progression compared with prior PET scan.  A: Symptomatic bradycardia associated with hypotension / syncope, suspect related to increased vagal tone related to skull base tumor invasion / carotid bulb?Marland Kitchen Meets criteria AI Dry from vomiting contribution P:  Telemetry Dopamine gtt, goal HR > 60, MAP 65, attempt to wean dopamine unsuccessful 6/13 Decadron 10 q6 to reduce radiation neck edema, will reduce slight to 6 mg If remains brady and sympto on max dop (beta affect 7.5) will add levo, if fail elvo, to epi drip ( avoid if able) Stress stereoids in form decadron For induction chemo to [facility] this am , appreciate ONC rads, med onc direction Was neg on won, appears dry, cvp 2, bolus , increase rate to increase SV Pacer cordis in rt fem , remain for now Cards following  RENAL  Lab 09/13/11 0445 09/12/11 0446 09/10/11 2336  NA 136 136 140  K 4.3 4.1 --  CL 103 103 104  CO2 23 23 --  BUN 24* 17 19  CREATININE 0.82 0.58 0.80  CALCIUM  8.9 8.9 --  MG -- 2.2 --  PHOS -- -- --   Intake/Output      06/13 0701 - 06/14 0700 06/14 0701 - 06/15 0700   I.V. (mL/kg) 1567.1 (19.5) 60.3 (0.8)   IV Piggyback 2    Total Intake(mL/kg) 1569.1 (19.5) 60.3 (0.8)   Urine (mL/kg/hr) 2400 (1.2)    Total Output 2400    Net -830.9 +60.3         Foley:  NA  A:  Appears pre renal P:   Maintenance IVF NS 75 mL/h cvp low, bolus 750  GASTROINTESTINAL No results found for this  basename: AST:5,ALT:5,ALKPHOS:5,BILITOT:5,PROT:5,ALBUMIN:5 in the last 168 hours  A:  N / vom, tolerated diet P:   Advance diet zofran steroids  HEMATOLOGIC  Lab 09/13/11 0445 09/12/11 0446 09/11/11 0030 09/10/11 2336 09/10/11 2329  HGB 14.9 15.2 15.0 14.6 --  HCT 41.9 42.5 41.9 43.0 --  PLT 315 309 324 -- --  INR 2.23* 2.42* -- -- 2.43*  APTT -- -- -- -- 29   A:  Coumadin induced coagulopathy.  Appropriate INR. P:  Heparin to remain Consider coumadin restart if no procedures needed next 48 hrs  INFECTIOUS  Lab 09/13/11 0445 09/12/11 0446 09/11/11 0030  WBC 24.1* 16.1* 17.7*  PROCALCITON -- -- --   Cultures: NA Antibiotics: NA  A:  Steroid induced leukocytosis. History of splenectomy.afebrile P:   Reduce steroids slight CT no absces  ENDOCRINE No results found for this basename: GLUCAP:5 in the last 168 hours A:  Presum  Rel AI P:   Was on pred pre ICU, will need decadron to continue cbg add  NEUROLOGIC  Brain MRI:  5/30 >>> Left para pharyngeal mass is compatible with carcinoma. There is invasion of the skull base and suggestion of mild progression since the recent MRI. There is a left mastoid sinus effusion due to obstruction of the eustachian tube. Correlation with biopsy is suggested.  A:  Syncope, likely secondary to bradycardia / hypotension.  Nasopharyngeal carcinoma with skull base invasion.  Followed at [facility].  First radiation treatment on 6/11.  Chemotherapy planned at [facility]  P:   Decadron remain, reduction Oxycodone PRN pain I spoke to Dr Karoline Caldwell and Dr Oris Drone and med onc, appreciate plan to [facility], have made arrangements NO MRI needed per Dr Karoline Caldwell  BEST PRACTICE / DISPOSITION Level of Care:  ICU Primary Service:  PCCM Consultants: Cards, med onc, rad onc Code Status:  Full Diet:  Reg 6/14 DVT Px:  Heparin drip  GI Px:  Protonix Skin Integrity:  Intact Social / Family:  Updated  Ccm 30 min   Mcarthur Rossetti. Tyson Alias, MD, FACP Pgr:  [phone] [facility]

## 2011-09-13 NOTE — Consult Note (Signed)
[facility] INPATIENT PROGRESS NOTE  Name: Shane Marsh      MRN: [id number]    Location: 1223/1223-01  Date: 09/13/2011 Time:12:10 PM   Subjective: Interval History:Cai Hagos reported feeling stable.  He has not had any more syncopal episode since admission.  He still requires Dopamine due to bradycardia and hypotension.  He still has diplopia.  Patient denies confusion, drenching night sweats, palpable lymph node swelling, mucositis, odynophagia, dysphagia, nausea vomiting, jaundice, chest pain, palpitation, shortness of breath, dyspnea on exertion, productive cough, gum bleeding, epistaxis, hematemesis, hemoptysis, abdominal pain, abdominal swelling, early satiety, melena, hematochezia, hematuria, skin rash, spontaneous bleeding.    Objective: Vital signs in last 24 hours: Temp:  [97.8 F (36.6 C)-98.7 F (37.1 C)] 98.3 F (36.8 C) (06/14 1100) Pulse Rate:  [50-109] 62  (06/14 1100) Resp:  [15-28] 17  (06/14 1100) BP: (43-141)/(21-83) 113/61 mmHg (06/14 1100) SpO2:  [89 %-100 %] 97 % (06/14 1100)    Intake/Output from previous day: 06/13 0701 - 06/14 0700 In: 1569.1 [I.V.:1567.1] Out: 2400 [Urine:2400]    PHYSICAL EXAM:  General: well-nourished man in no acute distress. Eyes: no scleral icterus. ENT: There were no oropharyngeal lesions on my unaided exam. Neck was without thyromegaly. There was fullness in the left cervical neck but no discreet mass.  Lymphatics: Negative cervical, supraclavicular or axillary adenopathy. Respiratory: lungs were clear bilaterally without wheezing or crackles. Cardiovascular: Regular rate and rhythm, S1/S2, without murmur, rub or gallop. There was no pedal edema. GI: abdomen was soft, flat, nontender, nondistended, without organomegaly. Muscoloskeletal: no spinal tenderness of palpation of vertebral spine. Skin exam was without echymosis, petichae. Neuro exam showed left eye not able to adduct laterally. His tongue deviated to the right and  uvela to the left. Patient was able to get sit up in bed without assistance. Patient was alerted and oriented x 4. Attention was good. Language was appropriate. Mood was normal without depression. Speech was not pressured. Thought content was not tangential.   LABS:    Results for DEANDRA, GADSON (MRN [id number]) as of 09/13/2011 12:13  Ref. Range 09/13/2011 04:45  Sodium Latest Range: 135-145 mEq/L 136  Potassium Latest Range: 3.5-5.1 mEq/L 4.3  Chloride Latest Range: 96-112 mEq/L 103  CO2 Latest Range: 19-32 mEq/L 23  BUN Latest Range: 6-23 mg/dL 24 (H)  Creat Latest Range: 0.50-1.35 mg/dL 0.86  Calcium Latest Range: 8.4-10.5 mg/dL 8.9  GFR calc non Af Amer Latest Range: >90 mL/min >90  GFR calc Af Amer Latest Range: >90 mL/min >90  Glucose Latest Range: 70-99 mg/dL 578 (H)  WBC Latest Range: 4.0-10.5 K/uL 24.1 (H)  RBC Latest Range: 4.22-5.81 MIL/uL 4.71  Hemoglobin Latest Range: 13.0-17.0 g/dL 46.9  HCT Latest Range: 39.0-52.0 % 41.9  MCV Latest Range: 78.0-100.0 fL 89.0  MCH Latest Range: 26.0-34.0 pg 31.6  MCHC Latest Range: 30.0-36.0 g/dL 62.9  RDW Latest Range: 11.5-15.5 % 13.2  Platelets Latest Range: 150-400 K/uL 315    MEDICATIONS: reviewed.     Assessment/Plan:   1.  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  - I again discussed with patient and his wife the risk and benefit of induction chemotherapy.  Induction chemo include Taxtore (75mg /m2 x 1), Cisplatin 75mg /m2/ x 1; 5FU continuous infusion from d1-5 (or 120 hours) at 750mg /m2/day.  We again went over potential side  effects of chemo which include but not limited to alopecia, mucositis, nausea vomiting, cytopenia, infection, bleeding, fatigue, diarrhea, chest pain, skin rash, neuropathy, infusion  reaction, pneumonitis, hearing loss, nephropathy, hand-foot syndrome, nail loss. The patient and his wife expressed informed understanding wished to star this induction chemotherapy today.   - He has specific antiemetics pre meds before dose of Taxotere and Cisplatin.  However, during the infusion of 5FU x 5 days, he can have other antiemetics such as Zofran/Compazine/Phenergan prn.   2. Bradycardia, hypotension from carotid sinus invasion of the cancer:  Continue with Dexamethasone IV and Dopamine per ICU.  3. Bilateral DVT's (dx in 07/2011):  On heparin IV gtt protocol.   4.  Leukocytosis:  Due to dexamethasone.  There is no focal source of infection.

## 2011-09-13 NOTE — Progress Notes (Signed)
ANTICOAGULATION CONSULT NOTE - Follow Up  Pharmacy Consult for IV Heparin Indication: DVT  Allergies  Allergen Reactions  . Morphine And Related Nausea And Vomiting    Patient Measurements: Height: 5\' 8"  (172.7 cm) Weight: 177 lb 4 oz (80.4 kg) (bed weight) IBW/kg (Calculated) : 68.4  Heparin Dosing Weight: 80.4 kg  Vital Signs: Temp: 97.9 F (36.6 C) (06/14 0359) Temp src: Oral (06/14 0359) BP: 103/49 mmHg (06/14 0500) Pulse Rate: 50  (06/14 0500)  Labs:  Shane Marsh 09/13/11 0445 09/12/11 0446 09/11/11 2200 09/11/11 2130 09/11/11 1420 09/11/11 0412 09/11/11 0030 09/10/11 2336 09/10/11 2329  HGB 14.9 15.2 -- -- -- -- -- -- --  HCT 41.9 42.5 -- -- -- -- 41.9 -- --  PLT 315 309 -- -- -- -- 324 -- --  APTT -- -- -- -- -- -- -- -- 29  LABPROT 25.1* 26.7* -- -- -- -- -- -- 26.8*  INR 2.23* 2.42* -- -- -- -- -- -- 2.43*  HEPARINUNFRC 0.78* 0.55 -- 0.43 -- -- -- -- --  CREATININE -- 0.58 -- -- -- -- -- 0.80 --  CKTOTAL -- -- 50 -- 64 88 -- -- --  CKMB -- -- 1.5 -- 1.7 1.8 -- -- --  TROPONINI -- -- <0.30 -- <0.30 <0.30 -- -- --    Estimated Creatinine Clearance: 96.2 ml/min (by C-G formula based on Cr of 0.58).   Medical History: Past Medical History  Diagnosis Date  . Hypertension   . Cancer     nasal and throat    Medications:  Scheduled:     . ALPRAZolam  0.25 mg Oral Once  . antiseptic oral rinse  15 mL Mouth Rinse q12n4p  . atropine  1 mg Intravenous Once  . atropine      . atropine      . chlorhexidine  15 mL Mouth Rinse BID  . dexamethasone  10 mg Intravenous Q6H  . diphenhydrAMINE  25 mg Intravenous Once  . pantoprazole (PROTONIX) IV  40 mg Intravenous QHS    Assessment: 42 YOM with nasopharyngeal carcinoma s/p radation 6/11 admitted with bradycardia, dyspnea, hypotension, and new syncope on Coumadin for bilateral DVT (5/30) prior to admission. CCM changed Coumadin to IV heparin for possible procedures on 6/12. INR 2.43 on admit 09/10/11 ->> now 2.23.  Last dose of Coumadin was 6/11.  Heparin level (0.79) is above-goal on 1000 units/hr.  Per RN, no bleeding issues.   Goal of Therapy:  Heparin level 0.3-0.7 units/ml Monitor platelets by anticoagulation protocol: Yes   Plan:  1. Decrease IV heparin to 900 units/hr 2. Heparin level in 6 hours.   Lorre Munroe, PharmD 09/13/2011,5:29 AM

## 2011-09-13 NOTE — Progress Notes (Signed)
The patient is ready to transfer to [facility] for plans of chemo; vitals are stable; report called to [facility] and Selena Batten, Charity fundraiser at [employer]. Pt. Wife at bedside and plans to follow [facility]. All belongings are with pt.

## 2011-09-13 NOTE — Progress Notes (Signed)
ANTICOAGULATION CONSULT NOTE - Follow Up  Pharmacy Consult for IV Heparin Indication: DVT  Allergies  Allergen Reactions  . Morphine And Related Nausea And Vomiting    Patient Measurements: Height: 5\' 8"  (172.7 cm) Weight: 177 lb 4 oz (80.4 kg) (bed weight) IBW/kg (Calculated) : 68.4  Heparin Dosing Weight: 80.4 kg  Vital Signs: Temp: 98 F (36.7 C) (06/14 2000) Temp src: Oral (06/14 2000) BP: 162/85 mmHg (06/14 2036) Pulse Rate: 54  (06/14 2036)  Labs:  Basename 09/13/11 2020 09/13/11 1240 09/13/11 0445 09/12/11 0446 09/11/11 2200 09/11/11 1420 09/11/11 0412 09/11/11 0030 09/10/11 2336 09/10/11 2329  HGB -- -- 14.9 15.2 -- -- -- -- -- --  HCT -- -- 41.9 42.5 -- -- -- 41.9 -- --  PLT -- -- 315 309 -- -- -- 324 -- --  APTT -- -- -- -- -- -- -- -- -- 29  LABPROT -- -- 25.1* 26.7* -- -- -- -- -- 26.8*  INR -- -- 2.23* 2.42* -- -- -- -- -- 2.43*  HEPARINUNFRC 0.59 0.82* 0.78* -- -- -- -- -- -- --  CREATININE -- -- 0.82 0.58 -- -- -- -- 0.80 --  CKTOTAL -- -- -- -- 50 64 88 -- -- --  CKMB -- -- -- -- 1.5 1.7 1.8 -- -- --  TROPONINI -- -- -- -- <0.30 <0.30 <0.30 -- -- --    Estimated Creatinine Clearance: 93.8 ml/min (by C-G formula based on Cr of 0.82).   Assessment:  83 YOM with nasopharyngeal carcinoma s/p radation 6/11 admitted with bradycardia, dyspnea, hypotension, and new syncope on Coumadin for bilateral DVT (5/30) prior to admission.   CCM changed Coumadin to IV heparin for possible procedure.  Patient to begin chemo x 5 days, starting this afternoon.  Heparin level within range (down from high level this am) 0.59 < 0.82  Hgb/Platelets stable.  No bleeding/complications reported.   Goal of Therapy:  Heparin level 0.3-0.7 units/ml Monitor platelets by anticoagulation protocol: Yes   Plan:  1. Continue IV heparin @ 750 units/hr. 2. Heparin level in AM  Gwen Her PharmD  [phone] 09/13/2011 9:01 PM

## 2011-09-13 NOTE — Progress Notes (Signed)
Visiting with patient and wife in ICU today. He is receiving induction chemotherapy, in no acute distress.  Appreciate the swift care by Dr. Gaylyn Rong and the ICU staff.  Let the patient and his wife know that we will reassess his tumor radiographically in several weeks and hopefully be able to replan radiotherapy at that time.  Told them not to hesitate to call me with questions.  They are satisfied with this plan.

## 2011-09-13 NOTE — Progress Notes (Signed)
Subjective:  Felt some mild dizziness this morning when sitting up. He feels a fullness in his head when he sits up some diminished hearing in his left ear then begins to feel his dizziness. Occasionally, this is associated with decreased heart rate into the upper 40s.  Objective:  Vital Signs in the last 24 hours: Temp:  [97.8 F (36.6 C)-98.7 F (37.1 C)] 97.8 F (36.6 C) (06/14 0755) Pulse Rate:  [50-109] 52  (06/14 0800) Resp:  [15-28] 16  (06/14 0800) BP: (43-141)/(21-83) 112/56 mmHg (06/14 0800) SpO2:  [89 %-100 %] 92 % (06/14 0800)  Intake/Output from previous day: 06/13 0701 - 06/14 0700 In: 1569.1 [I.V.:1567.1; IV Piggyback:2] Out: 2400 [Urine:2400]   Physical Exam: General: Well developed, well nourished, in no acute distress. Head:  Normocephalic and atraumatic.Prior left neck dissection Lungs: Clear to auscultation and percussion. Heart: Normal S1 and S2.  No murmur, rubs or gallops.  Abdomen: soft, non-tender, positive bowel sounds. Extremities: No clubbing or cyanosis. No edema. Neurologic: Alert and oriented x 3.    Lab Results:  Basename 09/13/11 0445 09/12/11 0446  WBC 24.1* 16.1*  HGB 14.9 15.2  PLT 315 309    Basename 09/13/11 0445 09/12/11 0446  NA 136 136  K 4.3 4.1  CL 103 103  CO2 23 23  GLUCOSE 133* 125*  BUN 24* 17  CREATININE 0.82 0.58    Basename 09/11/11 2200 09/11/11 1420  TROPONINI <0.30 <0.30  Imaging: Ct Soft Tissue Neck W Contrast  09/11/2011  *RADIOLOGY REPORT*  Clinical Data: Nasopharyngeal carcinoma.  CT NECK WITH CONTRAST  Technique:  Multidetector CT imaging of the neck was performed with intravenous contrast.  Contrast: OMNIPAQUE IOHEXOL 350 MG/ML SOLN  Comparison: PET scan 09/04/2011.  MR head 08/29/2011.  Findings: Large left posterior nasopharyngeal mass with extension across the midline, into the skull base, and laterally extending to the parapharyngeal and carotid space.  Exact measurement of the mass is difficult  due to its pleomorphic nature.  At the level of the foramen magnum, cross-sectional measurements are 41 x 51 mm. At the level of C2, where the mass extends more laterally, the mass measures 24 x 84 mm.  Craniocaudal extent is approximately 55 mm. There is fullness in the left cavernous sinus which on previous MR represents intracranial extension.  There is slight fullness enhancement of the dura on the left at the foramen magnum level also suggesting intraspinal/intracranial extension.  Clear-cut osseous destruction of the skull base is not seen although MR demonstrates skull base involvement.  The cavernous sinus extension may be via the foramen of Lacerum alongside the carotid artery.  Lateral extension of mass extends to the left parotid space deep lobe; (image 39 series 13) the mass is also contiguous with the pterygoids on the left.  There are surgical clips in the neck just above the thyroid gland on the left.  This may represent previous biopsy.  Bilateral hypermetabolic levels III and left level II lymph nodes as described on PET scan are redemonstrated. It is possible that the large parapharyngeal mass on the left at the level of C2 represents a conglomerate mass of lymph nodes which is adjacent to the primary lesion.  The airway is not significantly compromised by this lesion.  The paranasal sinuses are clear.  Left mastoid effusion represents eustachian tube dysfunction.  Left internal carotid artery is surrounded by the tumor and narrowed.  (Image 40 series 13). Compared with prior PET scan and a most recent MR,  the appearance is similar.  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 No significant progression compared with prior PET scan.  Original Report Authenticated By: Elsie Stain, M.D.   Ct Angio Chest W/cm &/or Wo Cm  09/11/2011  *RADIOLOGY REPORT*  Clinical Data: Bilateral DVT.  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   Syncopal episodes at home.Symptomatic bradycardia.  CT ANGIOGRAPHY CHEST  Technique:  Multidetector CT imaging of the chest using the standard protocol during bolus administration of intravenous contrast. Multiplanar reconstructed images including MIPs were obtained and reviewed to evaluate the vascular anatomy.  Contrast: OMNIPAQUE IOHEXOL 350 MG/ML SOLN  Comparison: None.  Findings: The heart is normal.  No enlargement, coronary calcification, or pericardial effusion.  No hilar or mediastinal lymphadenopathy.  Small right effusion with slight right lower lobe subsegmental atelectasis. No pulmonary nodules are seen.  Good opacification of the pulmonary vasculature.  No evidence for pulmonary embolic disease.  Unremarkable aorta and visualized great vessels.  No upper abdominal pathology.  No acute osseous findings.  IMPRESSION: No evidence for pulmonary embolic disease.  Small right effusion and slight subsegmental atelectasis.  Original Report Authenticated By: Elsie Stain, M.D.   Dg Chest Port 1 View  09/11/2011  *RADIOLOGY REPORT*  Clinical Data: Line placement.  PORTABLE CHEST - 1 VIEW  Comparison: CT chest earlier this same day and plain film chest 09/10/2011.  Findings: New right IJ catheter is in place with the tip in the lower superior vena cava.  No pneumothorax.  Lungs are clear. Heart size is normal.  Surgical clips base of the neck on the left noted.  IMPRESSION: IJ catheter in good position.  No pneumothorax or other acute finding.  Original Report Authenticated By: Bernadene Bell. Maricela Curet, M.D.   Personally viewed.   Telemetry: Occasional bradycardia into the 40s Personally viewed.   Cardiac Studies:  Prior EF normal.  Assessment/Plan:  Active Problems:  Syncope  Nasopharyngeal carcinoma  Bradycardia  Hypotension  DVT (deep venous thrombosis)  Coagulopathy  Syncope/vasovagal syncope/bradycardia  - Please see prior consult note for full details. His syncope is likely from  neurocardiogenic cause. It is unlikely that a pacemaker would correct the situation given that he has concomitant hypotension. I've discussed fully with the family. The underlying issue is his nasopharyngeal carcinoma which is likely impinging on his carotid bulb or causing similar stimulation of his vagal nerve.  - Continue to try to wean dopamine if possible. Aggressive hydration.  - Atropine if necessary.  DVTs  - Being treated with chronic Coumadin.   Sammy Douthitt 09/13/2011, 9:50 AM

## 2011-09-13 NOTE — Progress Notes (Signed)
Bilateral lower extremity venous duplex completed.  Preliminary report is negative for DVT, SVT, or a Baker's cyst. 

## 2011-09-13 NOTE — Telephone Encounter (Signed)
error 

## 2011-09-13 NOTE — Progress Notes (Signed)
  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 Tyson Alias, Dr. Gaylyn Rong, as well as with the patient's wife about [name] condition. He has been in the ICU for over 2 days since he developed bradycardia and hypotension which are most likely due to his advanced tumor pressing on the carotid sinus. This occurred after two radiation treatments in [place].  I concur with Dr. Lodema Pilot plan to start induction chemotherapy as soon as possible.  I will hold radiotherapy for now.  Our goal is to shrink his disease with a lower risk for peritumoral swelling than that posed by radiotherapy.  Our concern is that further radiotherapy, while likely to eventually shrink his tumor, carries a high risk of initial swelling and detriment to his nervous system.  We will resume radiotherapy when is it felt to be safer (ie after several weeks of chemotherapy).  Appreciate all of the team's help on this very fragile patient. I think the goal of cure, while ambitious, is still reasonable as this type of disease is usually sensitive to chemotherapy and radiation, and his PET is negative for distant mets.   -----------------------------------  Lonie Peak, MD

## 2011-09-13 NOTE — Progress Notes (Signed)
ANTICOAGULATION CONSULT NOTE - Follow Up  Pharmacy Consult for IV Heparin Indication: DVT  Allergies  Allergen Reactions  . Morphine And Related Nausea And Vomiting    Patient Measurements: Height: 5\' 8"  (172.7 cm) Weight: 177 lb 4 oz (80.4 kg) (bed weight) IBW/kg (Calculated) : 68.4  Heparin Dosing Weight: 80.4 kg  Vital Signs: Temp: 98.3 F (36.8 C) (06/14 1100) Temp src: Oral (06/14 0755) BP: 110/59 mmHg (06/14 1200) Pulse Rate: 67  (06/14 1200)  Labs:  Basename 09/13/11 1240 09/13/11 0445 09/12/11 0446 09/11/11 2200 09/11/11 1420 09/11/11 0412 09/11/11 0030 09/10/11 2336 09/10/11 2329  HGB -- 14.9 15.2 -- -- -- -- -- --  HCT -- 41.9 42.5 -- -- -- 41.9 -- --  PLT -- 315 309 -- -- -- 324 -- --  APTT -- -- -- -- -- -- -- -- 29  LABPROT -- 25.1* 26.7* -- -- -- -- -- 26.8*  INR -- 2.23* 2.42* -- -- -- -- -- 2.43*  HEPARINUNFRC 0.82* 0.78* 0.55 -- -- -- -- -- --  CREATININE -- 0.82 0.58 -- -- -- -- 0.80 --  CKTOTAL -- -- -- 50 64 88 -- -- --  CKMB -- -- -- 1.5 1.7 1.8 -- -- --  TROPONINI -- -- -- <0.30 <0.30 <0.30 -- -- --    Estimated Creatinine Clearance: 93.8 ml/min (by C-G formula based on Cr of 0.82).   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   CCM changed Coumadin to IV heparin for possible procedure.  Patient to begin chemo x 5 days, starting this afternoon.  Heparin level returned high.  Hgb/Platelets stable.  No bleeding/complications reported.   Goal of Therapy:  Heparin level 0.3-0.7 units/ml Monitor platelets by anticoagulation protocol: Yes   Plan:  1. Decrease IV heparin to 750 units/hr. 2. Heparin level in 6 hours.   Clance Boll, PharmD, BCPS Pager: [phone] 09/13/2011 1:32 PM

## 2011-09-14 ENCOUNTER — Inpatient Hospital Stay (HOSPITAL_COMMUNITY): Payer: 59

## 2011-09-14 LAB — GLUCOSE, CAPILLARY: Glucose-Capillary: 123 mg/dL — ABNORMAL HIGH (ref 70–99)

## 2011-09-14 LAB — COMPREHENSIVE METABOLIC PANEL
Albumin: 2.5 g/dL — ABNORMAL LOW (ref 3.5–5.2)
BUN: 15 mg/dL (ref 6–23)
Calcium: 7.3 mg/dL — ABNORMAL LOW (ref 8.4–10.5)
Creatinine, Ser: 0.6 mg/dL (ref 0.50–1.35)
Total Bilirubin: 1.1 mg/dL (ref 0.3–1.2)
Total Protein: 5.1 g/dL — ABNORMAL LOW (ref 6.0–8.3)

## 2011-09-14 LAB — CBC
MCH: 31.8 pg (ref 26.0–34.0)
MCHC: 36.2 g/dL — ABNORMAL HIGH (ref 30.0–36.0)
MCV: 87.7 fL (ref 78.0–100.0)
Platelets: 286 10*3/uL (ref 150–400)
RDW: 13.1 % (ref 11.5–15.5)

## 2011-09-14 LAB — PROTIME-INR: Prothrombin Time: 20.3 seconds — ABNORMAL HIGH (ref 11.6–15.2)

## 2011-09-14 MED ORDER — POTASSIUM CHLORIDE CRYS ER 20 MEQ PO TBCR
40.0000 meq | EXTENDED_RELEASE_TABLET | Freq: Once | ORAL | Status: AC
  Administered 2011-09-14: 40 meq via ORAL
  Filled 2011-09-14: qty 2

## 2011-09-14 NOTE — Progress Notes (Signed)
Name: Shane Marsh MRN: [id number] DOB: 05/08/1950    LOS: 4  Referring Provider:  EDP Reason for Referral:  Bradycardia / syncope   PULMONARY / CRITICAL CARE MEDICINE  Brief patient description:  61 y/o with nasopharyngeal carcinoma invading skull base and bilateral LE DVT on Coumadin admitted for bradycardia associated with dyspnea, hypotension and syncope.  Events Since Admission: 6/12  Admitted for bradycardia associated with dyspnea, hypotension and syncope 6/12- remains on pressors 6/13- remains on dop 7.5 6/14- transferred to [facility] and first chemo given  Current Status:  On dopa 7 First chemo was last night comfortable   Vital Signs: Temp:  [97.7 F (36.5 C)-98.4 F (36.9 C)] 97.7 F (36.5 C) (06/15 0000) Pulse Rate:  [41-93] 54  (06/15 0700) Resp:  [14-24] 16  (06/15 0700) BP: (101-162)/(54-85) 120/72 mmHg (06/15 0700) SpO2:  [92 %-98 %] 93 % (06/15 0700) Weight:  [78.7 kg (173 lb 8 oz)] 78.7 kg (173 lb 8 oz) (06/15 0100)  Physical Examination: General: no distress Neuro: Alert and oriented, Left eye with double vision intermitent HEENT:  Oropharynx edema left tonsillar erea Neck:  Supple, nolymphadenopathy Cardiovascular:  Sinus bradycardia on monitor, no murmurs s1 s2  Lungs:  CTAB Abdomen:  Soft, nontender, bowel sounds present Musculoskeletal:  NO edema, lower extremities Skin:  Intact  Active Problems:  Syncope  Nasopharyngeal carcinoma  Bradycardia  Hypotension  DVT (deep venous thrombosis)  Coagulopathy  ASSESSMENT AND PLAN  PULMONARY  Lab 09/12/11 1234  PHART 7.603*  PCO2ART 21.0*  PO2ART 126.0*  HCO3 20.8  O2SAT 99.0   Ventilator Settings:   CXR:  6/11 >>> NAD VQ:  5/30 >>> Low probability for PE CT angio 6/12>> negative for PE ETT:  NA  A:  Episodic dyspnea associated with bradycardia / hypotension.  Recent VQ scan negative for PE and now CT.  Appropriately anticoagulated for DVT. P:   Supplemental oxygen Goal SpO2 > 92% CXR  clear 6/15  CARDIOVASCULAR  Lab 09/11/11 2200 09/11/11 1420 09/11/11 0412  TROPONINI <0.30 <0.30 <0.30  LATICACIDVEN -- -- --  PROBNP -- -- --   ECG:  6/11 >>> Sinus bradycardia, no ST-T changes TTE:  5/30 >>> EF 55-60%, no wall motion abnormalities, normal valves Carotid Doppler:  5/30 >>> No significant ICA stenosis LE venous Doppler:  5/30 >>> Findings consistent with acute deep vein thrombosis involving the popliteal and peroneal veins of both the right and left lower extremity. ECHO 6/12>>> EF 65%, RV normal Lines: R IJ 6/12>>> CT neck 6/12: Large left posterior nasopharyngeal mass consistent with carcinoma. No significant progression compared with prior PET scan.  A: Symptomatic bradycardia associated with hypotension / syncope, suspect related to increased vagal tone related to skull base tumor invasion / carotid bulb?Marland Kitchen Meets criteria AI Dry from vomiting contribution P:  Telemetry Dopamine gtt, goal HR > 60, MAP 65, will continue to try to wean Reviewed case w Dr Donnie Aho Precious Gilding may be an option here Decadron 6 mg q6h, ? wean 6/16 Chemo initiated per H/O Was neg on won, appears dry, cvp 2, bolus , increase rate to increase SV Pacer cordis in rt fem >> will pull today, stop heparin x 2 hours before and after Cards following  RENAL  Lab 09/14/11 0437 09/13/11 0445 09/12/11 0446 09/10/11 2336  NA 137 136 136 140  K 3.4* 4.3 -- --  CL 108 103 103 104  CO2 23 23 23  --  BUN 15 24* 17 19  CREATININE 0.60 0.82  0.58 0.80  CALCIUM 7.3* 8.9 8.9 --  MG -- -- 2.2 --  PHOS -- -- -- --   Intake/Output      06/14 0701 - 06/15 0700 06/15 0701 - 06/16 0700   I.V. (mL/kg) 1246.9 (15.8)    Other 500    IV Piggyback 2133.8    Total Intake(mL/kg) 3880.7 (49.3)    Urine (mL/kg/hr) 3150 (1.7)    Stool 1400    Total Output 4550    Net -669.3          Foley:  NA  A:  Appears pre renal, but high UOP - auto-diuresing P:   Maintenance IVF NS 75 mL/h  GASTROINTESTINAL  Lab  09/14/11 0437  AST 35  ALT 108*  ALKPHOS 50  BILITOT 1.1  PROT 5.1*  ALBUMIN 2.5*    A:  N / vom, tolerated diet P:   Advance diet zofran steroids  HEMATOLOGIC  Lab 09/14/11 0437 09/13/11 0445 09/12/11 0446 09/11/11 0030 09/10/11 2336 09/10/11 2329  HGB 14.2 14.9 15.2 15.0 14.6 --  HCT 39.2 41.9 42.5 41.9 43.0 --  PLT 286 315 309 324 -- --  INR 1.70* 2.23* 2.42* -- -- 2.43*  APTT -- -- -- -- -- 29   A:  Coumadin induced coagulopathy.  Appropriate INR. P:  Heparin gtt Consider coumadin once certain no procedures planned, possibly 5/16  INFECTIOUS  Lab 09/14/11 0437 09/13/11 0445 09/12/11 0446 09/11/11 0030  WBC 19.4* 24.1* 16.1* 17.7*  PROCALCITON -- -- -- --   Cultures: NA Antibiotics: NA  A:  Steroid induced leukocytosis. History of splenectomy, afebrile P:   CT no abscess No abx at this time  ENDOCRINE  Lab 09/13/11 2204 09/13/11 1656 09/13/11 1345  GLUCAP 150* 108* 142*   A:  Presum  Rel AI P:   Was on pred pre ICU, will need decadron to continue cbg added  NEUROLOGIC  Brain MRI:  5/30 >>> Left para pharyngeal mass is compatible with carcinoma. There is invasion of the skull base and suggestion of mild progression since the recent MRI. There is a left mastoid sinus effusion due to obstruction of the eustachian tube. Correlation with biopsy is suggested.  A:  Syncope, likely secondary to bradycardia / hypotension.  Nasopharyngeal carcinoma with skull base invasion.  Followed at [facility].  First radiation treatment on 6/11.  Chemotherapy planned at [facility]  P:   Decadron as ordered Oxycodone PRN pain Consider evolving DI if UOP remains high, follow BMP NO MRI needed per Dr Karoline Caldwell  BEST PRACTICE / DISPOSITION Level of Care:  ICU Primary Service:  PCCM Consultants: Cards, med onc, rad onc Code Status:  Full Diet:  Reg 6/14 DVT Px:  Heparin drip  GI Px:  Protonix Skin Integrity:  Intact Social / Family:  Updated  CC time 30 minutes  Levy Pupa, MD, PhD 09/14/2011, 8:27 AM [place] [phone] or if no answer [phone]

## 2011-09-14 NOTE — Progress Notes (Signed)
ANTICOAGULATION CONSULT NOTE - Follow Up Consult  Pharmacy Consult for heparin Indication: DVT  Allergies  Allergen Reactions  . Morphine And Related Nausea And Vomiting    Patient Measurements: Height: 5\' 8"  (172.7 cm) Weight: 173 lb 8 oz (78.7 kg) IBW/kg (Calculated) : 68.4  Heparin Dosing Weight:   Vital Signs: Temp: 97.7 F (36.5 C) (06/15 0000) Temp src: Oral (06/15 0000) BP: 123/68 mmHg (06/15 0500) Pulse Rate: 52  (06/15 0500)  Labs:  Basename 09/14/11 0437 09/13/11 2020 09/13/11 1240 09/13/11 0445 09/12/11 0446 09/11/11 2200 09/11/11 1420  HGB 14.2 -- -- 14.9 -- -- --  HCT 39.2 -- -- 41.9 42.5 -- --  PLT 286 -- -- 315 309 -- --  APTT -- -- -- -- -- -- --  LABPROT 20.3* -- -- 25.1* 26.7* -- --  INR 1.70* -- -- 2.23* 2.42* -- --  HEPARINUNFRC 0.66 0.59 0.82* -- -- -- --  CREATININE 0.60 -- -- 0.82 0.58 -- --  CKTOTAL -- -- -- -- -- 50 64  CKMB -- -- -- -- -- 1.5 1.7  TROPONINI -- -- -- -- -- <0.30 <0.30    Estimated Creatinine Clearance: 96.2 ml/min (by C-G formula based on Cr of 0.6).   Medications:  Infusions:    . sodium chloride 20 mL/hr (09/13/11 1458)  . DOPamine 7 mcg/kg/min (09/13/11 2358)  . heparin 750 Units/hr (09/14/11 0000)  . DISCONTD: sodium chloride 40 mL/hr at 09/12/11 2200    Assessment: Patient with 2nd level at goal. No issues per RN.  Goal of Therapy:  Heparin level 0.3-0.7 units/ml Monitor platelets by anticoagulation protocol: Yes   Plan:  Continue heparin at current rate, daily Heparin level  Aleene Davidson Crowford 09/14/2011,5:54 AM

## 2011-09-14 NOTE — Progress Notes (Signed)
Subjective:  C/o double vision.  Not SOB now.  Still on dopamine. No more severe bradycardia.  Objective:  Vital Signs in the last 24 hours: BP 117/39  Pulse 58  Temp 97.7 F (36.5 C) (Oral)  Resp 15  Ht 5\' 8"  (1.727 m)  Wt 78.7 kg (173 lb 8 oz)  BMI 26.38 kg/m2  SpO2 97%  Physical Exam: Pleasant WM in NAD Lungs:  Clear Cardiac:  Regular rhythm, normal S1 and S2, no S3 Extremities:  No edema present  Intake/Output from previous day: 06/14 0701 - 06/15 0700 In: 3880.7 [I.V.:1246.9; IV Piggyback:2133.8] Out: 4550 [Urine:3150; Stool:1400]  Weight Filed Weights   09/11/11 0100 09/11/11 0200 09/14/11 0100  Weight: 82.9 kg (182 lb 12.2 oz) 80.4 kg (177 lb 4 oz) 78.7 kg (173 lb 8 oz)    Lab Results: Basic Metabolic Panel:  Basename 09/14/11 0437 09/13/11 0445  NA 137 136  K 3.4* 4.3  CL 108 103  CO2 23 23  GLUCOSE 117* 133*  BUN 15 24*  CREATININE 0.60 0.82    CBC:  Basename 09/14/11 0437 09/13/11 0445  WBC 19.4* 24.1*  NEUTROABS -- --  HGB 14.2 14.9  HCT 39.2 41.9  MCV 87.7 89.0  PLT 286 315    PROTIME: Lab Results  Component Value Date   INR 1.70* 09/14/2011   INR 2.23* 09/13/2011   INR 2.42* 09/12/2011    Telemetry: Sinus rhtyhm  Assessment/Plan:  1. Syncope probably vagally mediated through cardioinhibitory mechanism through carotid bulb sedondary to cancer or treatment effect 2. Recent DVT  Rec:  Note high volume of urine output.  I wonder if an anticholinergic might be useful if unable to wean dopamine.  Will research.   Darden Palmer  MD [facility]  09/14/2011, 8:16 AM

## 2011-09-14 NOTE — Progress Notes (Signed)
Subjective: Patient tolerated the docetaxol well, he continues on the infusional 5FU, had an episode of nausea but it is well controlled with present anti-emetic regimen. He continues to experience diplopia. No fevers or chills or bleeding. No diarrhea.  Objective:  Most recent Vital signs: Blood pressure 117/39, pulse 58, temperature 97.7 F (36.5 C), temperature source Oral, resp. rate 15, height 5\' 8"  (1.727 m), weight 173 lb 8 oz (78.7 kg), SpO2 97.00%. 5\' 8"  (1.727 m)    Body surface area is 1.94 meters squared.  Vital signs in last 24 hours: Temp:  [97.7 F (36.5 C)-98.4 F (36.9 C)] 97.7 F (36.5 C) (06/15 0000) Pulse Rate:  [41-93] 58  (06/15 0800) Resp:  [14-24] 15  (06/15 0800) BP: (101-162)/(39-85) 117/39 mmHg (06/15 0800) SpO2:  [92 %-98 %] 97 % (06/15 0800) Weight:  [173 lb 8 oz (78.7 kg)] 173 lb 8 oz (78.7 kg) (06/15 0100)  Intake/Output from previous day: 06/14 0701 - 06/15 0700 In: 3880.7 [I.V.:1246.9; IV Piggyback:2133.8] Out: 4550 [Urine:3150; Stool:1400]  Physical Exam: General appearance: alert, cooperative and appears stated age Eyes: conjunctivae/corneas clear. PERRL, EOM's intact. Fundi benign., experiencing diplopia Throat: lips, mucosa, and tongue normal; teeth and gums normal Resp: clear to auscultation bilaterally and normal percussion bilaterally Cardio: regular rate and rhythm, S1, S2 normal, no murmur, click, rub or gallop GI: soft, non-tender; bowel sounds normal; no masses,  no organomegaly Extremities: extremities normal, atraumatic, no cyanosis or edema Skin: Skin color, texture, turgor normal. No rashes or lesions  Lab Results:   Basename 09/14/11 0437 09/13/11 0445  WBC 19.4* 24.1*  HGB 14.2 14.9  HCT 39.2 41.9  PLT 286 315   BMET:  Basename 09/14/11 0437 09/13/11 0445  NA 137 136  K 3.4* 4.3  CL 108 103  CO2 23 23  GLUCOSE 117* 133*  BUN 15 24*  CREATININE 0.60 0.82  CALCIUM 7.3* 8.9   CMP:     Component Value Date/Time     NA 137 09/14/2011 0437   K 3.4* 09/14/2011 0437   CL 108 09/14/2011 0437   CO2 23 09/14/2011 0437   GLUCOSE 117* 09/14/2011 0437   BUN 15 09/14/2011 0437   CREATININE 0.60 09/14/2011 0437   CALCIUM 7.3* 09/14/2011 0437   PROT 5.1* 09/14/2011 0437   ALBUMIN 2.5* 09/14/2011 0437   AST 35 09/14/2011 0437   ALT 108* 09/14/2011 0437   ALKPHOS 50 09/14/2011 0437   BILITOT 1.1 09/14/2011 0437   GFRNONAA >90 09/14/2011 0437   GFRAA >90 09/14/2011 0437   Micro: Results for orders placed during the hospital encounter of 09/10/11  MRSA PCR SCREENING     Status: Normal   Collection Time   09/11/11  2:33 AM      Component Value Range Status Comment   MRSA by PCR NEGATIVE  NEGATIVE Final      Studies/Results: Dg Chest Port 1 View  09/14/2011  *RADIOLOGY REPORT*  Clinical Data: Evaluate pulmonary edema, history of nasal and throat cancer  PORTABLE CHEST - 1 VIEW  Comparison: 09/11/2011; 09/10/18 13; chest CT - 09/11/2011  Findings: Grossly unchanged cardiac silhouette and mediastinal contours.  Stable positioning of support apparatus.  Minimal bibasilar opacities favored to represent atelectasis.  No definite pleural effusion or pneumothorax.   Surgical clips overlie the left supraclavicular fossa.  Unchanged bones.  IMPRESSION: Minimal bibasilar opacities favored to represent atelectasis.  No definite evidence of pulmonary edema.  Original Report Authenticated By: Judene Companion.D.  Medications: I have reviewed the patient's current medications.  Assessment/Plan: 61 y.o. male with  1. Recurrent Oropharyngeal squamous cell carcinoma: receiving inductin chemotherapy with docetaxel/5FU, thus with god toleration  2. Bradycardia/Hypotension: due invasion of the tumor to the carotid sinus on dexamethasone and dopamine  3. DVT's on IV heparin  4. Cbc looks pretty stable continue to monitor.    LOS: 4 days   Drue Second, MD Medical/Oncology [facility] [phone]  (beeper) [phone] (Office)  09/14/2011, 8:39 AM

## 2011-09-15 ENCOUNTER — Inpatient Hospital Stay (HOSPITAL_COMMUNITY): Payer: 59

## 2011-09-15 LAB — CBC
Hemoglobin: 14.6 g/dL (ref 13.0–17.0)
MCHC: 35.8 g/dL (ref 30.0–36.0)
Platelets: 273 10*3/uL (ref 150–400)
RDW: 13 % (ref 11.5–15.5)

## 2011-09-15 LAB — BASIC METABOLIC PANEL
GFR calc Af Amer: >90 mL/min (ref >90)
GFR calc non Af Amer: >90 mL/min (ref >90)
Glucose, Bld: 109 mg/dL — ABNORMAL HIGH (ref 70–99)
Potassium: 3.9 mEq/L (ref 3.5–5.1)
Sodium: 136 mEq/L (ref 135–145)

## 2011-09-15 LAB — HEPARIN LEVEL (UNFRACTIONATED): Heparin Unfractionated: 0.64 IU/mL (ref 0.30–0.70)

## 2011-09-15 LAB — GLUCOSE, CAPILLARY: Glucose-Capillary: 102 mg/dL — ABNORMAL HIGH (ref 70–99)

## 2011-09-15 MED ORDER — WARFARIN SODIUM 5 MG PO TABS
5.0000 mg | ORAL_TABLET | Freq: Once | ORAL | Status: AC
  Administered 2011-09-15: 5 mg via ORAL
  Filled 2011-09-15: qty 1

## 2011-09-15 MED ORDER — HEPARIN (PORCINE) IN NACL 100-0.45 UNIT/ML-% IJ SOLN
750.0000 [IU]/h | INTRAMUSCULAR | Status: DC
  Administered 2011-09-18: 750 [IU]/h via INTRAVENOUS
  Filled 2011-09-15 (×5): qty 250

## 2011-09-15 MED ORDER — WARFARIN - PHARMACIST DOSING INPATIENT: Freq: Every day | Status: DC

## 2011-09-15 NOTE — Progress Notes (Signed)
Subjective:  Tolerating chemotherapy fair.  Mild nausea.  Remains on dopamine with borderline BP.  No SOB or chest pain. Objective:  Vital Signs in the last 24 hours: BP 120/64  Pulse 61  Temp 98.9 F (37.2 C) (Oral)  Resp 17  Ht 5\' 8"  (1.727 m)  Wt 78.6 kg (173 lb 4.5 oz)  BMI 26.35 kg/m2  SpO2 98%  Physical Exam: Pleasant WM in NAD Lungs:  Clear Cardiac:  Regular rhythm, normal S1 and S2, no S3 Extremities:  No edema present  Intake/Output from previous day: 06/15 0701 - 06/16 0700 In: 3775.9 [P.O.:240; I.V.:2501.9; IV Piggyback:1034] Out: 3810 [Urine:3810]  Weight Filed Weights   09/11/11 0200 09/14/11 0100 09/15/11 0400  Weight: 80.4 kg (177 lb 4 oz) 78.7 kg (173 lb 8 oz) 78.6 kg (173 lb 4.5 oz)    Lab Results: Basic Metabolic Panel:  Basename 09/15/11 0500 09/14/11 0437  NA 136 137  K 3.9 3.4*  CL 104 108  CO2 24 23  GLUCOSE 109* 117*  BUN 17 15  CREATININE 0.58 0.60    CBC:  Basename 09/15/11 0500 09/14/11 0437  WBC 15.9* 19.4*  NEUTROABS -- --  HGB 14.6 14.2  HCT 40.8 39.2  MCV 87.2 87.7  PLT 273 286    PROTIME: Lab Results  Component Value Date   INR 1.24 09/15/2011   INR 1.70* 09/14/2011   INR 2.23* 09/13/2011    Telemetry: Sinus rhtyhm  Assessment/Plan:  1. Syncope probably vagally mediated through cardioinhibitory mechanism through carotid bulb sedondary to cancer or treatment effect -  no recurrence 2. Recent DVT  Rec:  Discussed with Dr. Delton Coombes.  Rec try to taper dopamine.  And keep hydrated.  IF recurrent bradycardia/hypotension might be worth have EP evaluate to see if anticholinergic might be of use.([place])   W. Ashley Royalty  MD [facility]  09/15/2011, 8:12 AM

## 2011-09-15 NOTE — Progress Notes (Signed)
Subjective: Patient tolerated the chemotherapyl well, he continues on the infusional 5FU, had an episode of nausea but it is well controlled with present anti-emetic regimen. He continues to experience diplopia. No fevers or chills or bleeding. No diarrhea. Still dizzy and light headed with movent.   Objective:  Most recent Vital signs: Blood pressure 120/64, pulse 61, temperature 98.9 F (37.2 C), temperature source Oral, resp. rate 17, height 5\' 8"  (1.727 m), weight 173 lb 4.5 oz (78.6 kg), SpO2 98.00%. 5\' 8"  (1.727 m)    Body surface area is 1.94 meters squared.  Vital signs in last 24 hours: Temp:  [97.6 F (36.4 C)-98.9 F (37.2 C)] 98.9 F (37.2 C) (06/16 0400) Pulse Rate:  [51-71] 61  (06/16 0600) Resp:  [14-18] 17  (06/16 0600) BP: (93-131)/(39-78) 120/64 mmHg (06/16 0600) SpO2:  [88 %-98 %] 98 % (06/16 0600) Weight:  [173 lb 4.5 oz (78.6 kg)] 173 lb 4.5 oz (78.6 kg) (06/16 0400)  Intake/Output from previous day: 06/15 0701 - 06/16 0700 In: 3619.8 [P.O.:240; I.V.:2388.8; IV Piggyback:991] Out: 3810 [Urine:3810]  Physical Exam: General appearance: alert, cooperative and appears stated age Eyes: conjunctivae/corneas clear. PERRL, EOM's intact. Fundi benign., experiencing diplopia Throat: lips, mucosa, and tongue normal; teeth and gums normal Resp: clear to auscultation bilaterally and normal percussion bilaterally Cardio: regular rate and rhythm, S1, S2 normal, no murmur, click, rub or gallop GI: soft, non-tender; bowel sounds normal; no masses,  no organomegaly Extremities: extremities normal, atraumatic, no cyanosis or edema Skin: Skin color, texture, turgor normal. No rashes or lesions  Lab Results:   Basename 09/15/11 0500 09/14/11 0437  WBC 15.9* 19.4*  HGB 14.6 14.2  HCT 40.8 39.2  PLT 273 286   BMET:  Basename 09/15/11 0500 09/14/11 0437  NA 136 137  K 3.9 3.4*  CL 104 108  CO2 24 23  GLUCOSE 109* 117*  BUN 17 15  CREATININE 0.58 0.60  CALCIUM 8.0*  7.3*   CMP:     Component Value Date/Time   NA 136 09/15/2011 0500   K 3.9 09/15/2011 0500   CL 104 09/15/2011 0500   CO2 24 09/15/2011 0500   GLUCOSE 109* 09/15/2011 0500   BUN 17 09/15/2011 0500   CREATININE 0.58 09/15/2011 0500   CALCIUM 8.0* 09/15/2011 0500   PROT 5.1* 09/14/2011 0437   ALBUMIN 2.5* 09/14/2011 0437   AST 35 09/14/2011 0437   ALT 108* 09/14/2011 0437   ALKPHOS 50 09/14/2011 0437   BILITOT 1.1 09/14/2011 0437   GFRNONAA >90 09/15/2011 0500   GFRAA >90 09/15/2011 0500   Micro: Results for orders placed during the hospital encounter of 09/10/11  MRSA PCR SCREENING     Status: Normal   Collection Time   09/11/11  2:33 AM      Component Value Range Status Comment   MRSA by PCR NEGATIVE  NEGATIVE Final      Studies/Results: Dg Chest Port 1 View  09/14/2011  *RADIOLOGY REPORT*  Clinical Data: Evaluate pulmonary edema, history of nasal and throat cancer  PORTABLE CHEST - 1 VIEW  Comparison: 09/11/2011; 09/10/18 13; chest CT - 09/11/2011  Findings: Grossly unchanged cardiac silhouette and mediastinal contours.  Stable positioning of support apparatus.  Minimal bibasilar opacities favored to represent atelectasis.  No definite pleural effusion or pneumothorax.   Surgical clips overlie the left supraclavicular fossa.  Unchanged bones.  IMPRESSION: Minimal bibasilar opacities favored to represent atelectasis.  No definite evidence of pulmonary edema.  Original Report Authenticated By:  Waynard Reeds, M.D.    Medications: I have reviewed the patient's current medications.  Assessment/Plan: 61 y.o. male with  1. 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  No complications noted.  2. Bradycardia/Hypotension: due invasion of the tumor to the carotid sinus on dexamethasone and dopamine. Vitals are stable at this point.   3. DVT's on IV heparin  4. Cbc  stable but continue to monitor.    09/15/2011, 7:22 AM

## 2011-09-15 NOTE — Progress Notes (Addendum)
ANTICOAGULATION CONSULT NOTE - Follow Up  Pharmacy Consult for IV Heparin Indication: DVT  Allergies  Allergen Reactions  . Morphine And Related Nausea And Vomiting    Patient Measurements: Height: 5\' 8"  (172.7 cm) Weight: 173 lb 4.5 oz (78.6 kg) IBW/kg (Calculated) : 68.4  Heparin Dosing Weight: 80.4 kg  Vital Signs: Temp: 98.9 F (37.2 C) (06/16 0400) Temp src: Oral (06/15 2000) BP: 120/64 mmHg (06/16 0600) Pulse Rate: 61  (06/16 0600)  Labs:  Basename 09/15/11 0611 09/15/11 0500 09/14/11 0437 09/13/11 2020 09/13/11 0445  HGB -- 14.6 14.2 -- --  HCT -- 40.8 39.2 -- 41.9  PLT -- 273 286 -- 315  APTT -- -- -- -- --  LABPROT 15.9* -- 20.3* -- 25.1*  INR 1.24 -- 1.70* -- 2.23*  HEPARINUNFRC 0.80* -- 0.66 0.59 --  CREATININE -- 0.58 0.60 -- 0.82  CKTOTAL -- -- -- -- --  CKMB -- -- -- -- --  TROPONINI -- -- -- -- --    Estimated Creatinine Clearance: 96.2 ml/min (by C-G formula based on Cr of 0.58).   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   CCM changed Coumadin to IV heparin for possible procedure. Patient currently receiving chemo (day 3/5) and CCM restarting coumadin today since no procedure is planned.  INR 1.24 today. Noted interaction with 5-FU: hypoprothrombinemic effects of warfarin may be increased with concurrent fluorouracil.  Will not restart warfarin higher than patient's home dose.  Heparin level above goal.  Hgb/Platelets stable.  No bleeding/complications reported.   Goal of Therapy:  Heparin level 0.3-0.7 units/ml Monitor platelets by anticoagulation protocol: Yes   Plan:  1. Reduce heparin rate to 600 units/hr (6 mL/hr). 2. Check heparin level 6 hours following rate adjustment. 3. Start coumadin 5 mg today at 1800.  Clance Boll, PharmD, BCPS Pager: [phone] 09/15/2011 7:56  AM   ADDENDUM: 09/15/2011 2:37 PM Heparin level 0.69 A/P: Heparin rate at high end of therapeutic range following reduction in rate.  Will reduce dose slightly to 550 units/hr (5.5 mL/hr) and f/u another HL in 6 hours.  Clance Boll, PharmD, BCPS Pager: [phone] 09/15/2011 2:38 PM      Addendum 10:13 PM  Repeat Heparin level is 0.64 units/ml on 550 units/hr.  Heparin levels have increase despite rate decreases, will give a further rate decrease to 500 units/hr tonight.  Repeat level in am. Goal range 0.3-0.7 units/ml.  Otho Bellows 09/15/2011

## 2011-09-15 NOTE — Progress Notes (Signed)
Pt asked to get out of bed and walk to BR to have BM. Notified patient that it is dangerous with his syncopal episodes and being on dopamine to walk around. Patient insists on walking to BR despite education.

## 2011-09-15 NOTE — Progress Notes (Signed)
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  Events Since Admission: 6/12  Admitted for bradycardia associated with dyspnea, hypotension and syncope 6/12- remains on pressors 6/13- remains on dopa 6/14- transferred to [facility] and first chemo given  Current Status:  On dopa 7 No apparent complications post-chemo initiation  Vital Signs: Temp:  [97.6 F (36.4 C)-98.9 F (37.2 C)] 98.9 F (37.2 C) (06/16 0400) Pulse Rate:  [51-71] 61  (06/16 0600) Resp:  [14-18] 17  (06/16 0600) BP: (93-131)/(39-78) 120/64 mmHg (06/16 0600) SpO2:  [88 %-98 %] 98 % (06/16 0600) Weight:  [78.6 kg (173 lb 4.5 oz)] 78.6 kg (173 lb 4.5 oz) (06/16 0400)  Intake/Output Summary (Last 24 hours) at 09/15/11 0800 Last data filed at 09/15/11 0700  Gross per 24 hour  Intake 3639.8 ml  Output   3810 ml  Net -170.2 ml   Physical Examination: General: no distress Neuro: Alert and oriented, Left eye with double vision intermitent HEENT:  Oropharynx edema left tonsillar erea Neck:  Supple, nolymphadenopathy Cardiovascular:  Sinus bradycardia on monitor, no murmurs s1 s2  Lungs:  CTAB Abdomen:  Soft, nontender, bowel sounds present Musculoskeletal:  NO edema, lower extremities Skin:  Intact  Active Problems:  Syncope  Nasopharyngeal carcinoma  Bradycardia  Hypotension  DVT (deep venous thrombosis)  Coagulopathy  ASSESSMENT AND PLAN  PULMONARY  Lab 09/12/11 1234  PHART 7.603*  PCO2ART 21.0*  PO2ART 126.0*  HCO3 20.8  O2SAT 99.0   Ventilator Settings:   CXR:  6/11 >>> NAD VQ:  5/30 >>> Low probability for PE CT angio 6/12>> negative for PE ETT:  NA  A:  Episodic dyspnea  associated with bradycardia / hypotension.  Recent VQ scan negative for PE and now CT.  Appropriately anticoagulated for DVT. P:   Supplemental oxygen Goal SpO2 > 92% CXR clear 6/15  CARDIOVASCULAR  Lab 09/11/11 2200 09/11/11 1420 09/11/11 0412  TROPONINI <0.30 <0.30 <0.30  LATICACIDVEN -- -- --  PROBNP -- -- --   ECG:  6/11 >>> Sinus bradycardia, no ST-T changes TTE:  5/30 >>> EF 55-60%, no wall motion abnormalities, normal valves Carotid Doppler:  5/30 >>> No significant ICA stenosis LE venous Doppler:  5/30 >>> Findings consistent with acute deep vein thrombosis involving the popliteal and peroneal veins of both the right and left lower extremity. ECHO 6/12>>> EF 65%, RV normal Lines: R IJ 6/12>>> CT neck 6/12: Large left posterior nasopharyngeal mass consistent with carcinoma. No significant progression compared with prior PET scan.  A: Symptomatic bradycardia associated with hypotension / syncope, suspect related to increased vagal tone related to skull base tumor invasion / carotid bulb?Marland Kitchen Meets criteria AI Dry from vomiting contribution P:  Telemetry Dopamine gtt, goal HR > 60, MAP 65, will continue to try to wean Reviewed case w Dr Donnie Aho Precious Gilding may be an option here for heart rate Decadron 6 mg q6h, ? wean 6/17 Chemo initiated per H/O, tolerating Pacer cordis in rt fem removed 6/15 Cards following  RENAL  Lab 09/15/11 0500 09/14/11 0437 09/13/11 0445 09/12/11 0446 09/10/11 2336  NA 136 137 136 136 140  K 3.9 3.4* -- -- --  CL 104 108 103 103 104  CO2 24 23 23 23  --  BUN 17 15 24* 17 19  CREATININE 0.58 0.60 0.82 0.58 0.80  CALCIUM 8.0* 7.3* 8.9 8.9 --  MG -- -- -- 2.2 --  PHOS -- -- -- -- --   Intake/Output      06/15 0701 - 06/16 0700 06/16 0701 - 06/17 0700   P.O. 240    I.V. (mL/kg) 2501.9 (31.8)    Other     IV Piggyback 1034    Total Intake(mL/kg) 3775.9 (48)    Urine (mL/kg/hr) 3810 (2)    Stool     Total Output 3810    Net -34.1            Foley:  NA  A:  Appears pre renal, but high UOP - auto-diuresing P:   Maintenance IVF NS 75 mL/h  GASTROINTESTINAL  Lab 09/14/11 0437  AST 35  ALT 108*  ALKPHOS 50  BILITOT 1.1  PROT 5.1*  ALBUMIN 2.5*    A:  N / vom, tolerated diet P:   Advance diet zofran steroids  HEMATOLOGIC  Lab 09/15/11 0611 09/15/11 0500 09/14/11 0437 09/13/11 0445 09/12/11 0446 09/11/11 0030 09/10/11 2329  HGB -- 14.6 14.2 14.9 15.2 15.0 --  HCT -- 40.8 39.2 41.9 42.5 41.9 --  PLT -- 273 286 315 309 324 --  INR 1.24 -- 1.70* 2.23* 2.42* -- 2.43*  APTT -- -- -- -- -- -- 29   A:  Coumadin induced coagulopathy.  Appropriate INR. P:  Heparin gtt Start coumadin today 6/16   INFECTIOUS  Lab 09/15/11 0500 09/14/11 0437 09/13/11 0445 09/12/11 0446 09/11/11 0030  WBC 15.9* 19.4* 24.1* 16.1* 17.7*  PROCALCITON -- -- -- -- --   Cultures: NA Antibiotics: NA  A:  Steroid induced leukocytosis. History of splenectomy, afebrile P:   CT no abscess No abx at this time  ENDOCRINE  Lab 09/14/11 2155 09/14/11 1707 09/14/11 1155 09/14/11 0759 09/13/11 2204  GLUCAP 109* 123* 115* 118* 150*   A:  Presum  Rel AI P:   Was on pred pre ICU, will need decadron to continue cbg added  NEUROLOGIC  Brain MRI:  5/30 >>> Left para pharyngeal mass is compatible with carcinoma. There is invasion of the skull base and suggestion of mild progression since the recent MRI. There is a left mastoid sinus effusion due to obstruction of the eustachian tube. Correlation with biopsy is suggested.  A:  Syncope, likely secondary to bradycardia / hypotension.  Nasopharyngeal carcinoma with skull base invasion.  Followed at [facility].  First radiation treatment on 6/11.  Chemotherapy planned at [facility]  P:   Decadron as ordered Oxycodone PRN pain Consider evolving DI if UOP remains high, follow BMP NO MRI needed per Dr Karoline Caldwell  BEST PRACTICE / DISPOSITION Level of Care:  ICU >> change to SDU status 6/16 Primary Service:   PCCM Consultants: Cards, med onc, rad onc Code Status:  Full Diet:  Reg 6/14 DVT Px:  Heparin drip > start coumadin 6/16 GI Px:  Protonix Skin Integrity:  Intact Social / Family:  Updated   Levy Pupa, MD, PhD 09/15/2011, 7:59 AM [facility] [phone] or if no answer [phone]

## 2011-09-16 ENCOUNTER — Encounter: Payer: Self-pay | Admitting: Hematology and Oncology

## 2011-09-16 ENCOUNTER — Other Ambulatory Visit: Payer: Self-pay | Admitting: Certified Registered Nurse Anesthetist

## 2011-09-16 LAB — CBC
MCH: 31.7 pg (ref 26.0–34.0)
MCHC: 35.9 g/dL (ref 30.0–36.0)
MCV: 88.2 fL (ref 78.0–100.0)
Platelets: 226 10*3/uL (ref 150–400)

## 2011-09-16 LAB — BASIC METABOLIC PANEL
BUN: 18 mg/dL (ref 6–23)
CO2: 25 mEq/L (ref 19–32)
Calcium: 8 mg/dL — ABNORMAL LOW (ref 8.4–10.5)
Chloride: 105 mEq/L (ref 96–112)
Creatinine, Ser: 0.7 mg/dL (ref 0.50–1.35)
GFR calc Af Amer: >90 mL/min (ref >90)
GFR calc non Af Amer: >90 mL/min (ref >90)
Potassium: 3.4 mEq/L — ABNORMAL LOW (ref 3.5–5.1)
Sodium: 135 mEq/L (ref 135–145)

## 2011-09-16 LAB — GLUCOSE, CAPILLARY
Glucose-Capillary: 96 mg/dL (ref 70–99)
Glucose-Capillary: 85 mg/dL (ref 70–99)

## 2011-09-16 LAB — PROTIME-INR: Prothrombin Time: 14.3 seconds (ref 11.6–15.2)

## 2011-09-16 MED ORDER — FLUDROCORTISONE ACETATE 0.1 MG PO TABS
0.1000 mg | ORAL_TABLET | Freq: Every day | ORAL | Status: DC
  Administered 2011-09-16 – 2011-09-20 (×5): 0.1 mg via ORAL
  Filled 2011-09-16 (×5): qty 1

## 2011-09-16 MED ORDER — POTASSIUM CHLORIDE CRYS ER 20 MEQ PO TBCR
40.0000 meq | EXTENDED_RELEASE_TABLET | Freq: Once | ORAL | Status: AC
  Administered 2011-09-16: 40 meq via ORAL
  Filled 2011-09-16: qty 1

## 2011-09-16 MED ORDER — DEXAMETHASONE SODIUM PHOSPHATE 10 MG/ML IJ SOLN
4.0000 mg | Freq: Four times a day (QID) | INTRAMUSCULAR | Status: AC
  Administered 2011-09-16 – 2011-09-17 (×5): 4 mg via INTRAVENOUS
  Filled 2011-09-16 (×8): qty 0.4

## 2011-09-16 MED ORDER — BOOST / RESOURCE BREEZE PO LIQD
1.0000 | Freq: Three times a day (TID) | ORAL | Status: DC
  Administered 2011-09-17 – 2011-09-19 (×6): 1 via ORAL

## 2011-09-16 MED ORDER — WARFARIN SODIUM 5 MG PO TABS
5.0000 mg | ORAL_TABLET | Freq: Once | ORAL | Status: AC
  Administered 2011-09-16: 5 mg via ORAL
  Filled 2011-09-16: qty 1

## 2011-09-16 NOTE — Progress Notes (Signed)
Pt HR increased to mid 90s and pt stated he felt like heart was racing. Pt and wife were anxious. Education and emotional support were provided. HR returned to 60-70s range without intervention. Dopamine decreased since BP now stable.

## 2011-09-16 NOTE — Progress Notes (Signed)
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  Events Since Admission: 6/12  Admitted for bradycardia associated with dyspnea, hypotension and syncope 6/12- remains on pressors 6/13- remains on dopa 6/14- transferred to [facility] and first chemo given  Current Status:  Hypotension after dop off for short period of time  Vital Signs: Temp:  [97.9 F (36.6 C)-98.3 F (36.8 C)] 98.1 F (36.7 C) (06/17 0800) Pulse Rate:  [44-92] 61  (06/17 0800) Resp:  [15-24] 17  (06/17 0800) BP: (80-118)/(28-69) 116/68 mmHg (06/17 0800) SpO2:  [90 %-98 %] 94 % (06/17 0800)  Intake/Output Summary (Last 24 hours) at 09/16/11 4098 Last data filed at 09/16/11 0600  Gross per 24 hour  Intake   2772 ml  Output   3200 ml  Net   -428 ml   Physical Examination: General: no distress Neuro: Alert and oriented, Left eye with double vision intermittent, patch to left eye HEENT:  Oropharynx edema left tonsillar Neck:  Supple, lymphadenopathy Cardiovascular:  Sinus bradycardia on monitor, no murmurs s1 s2  Lungs:  CTAB, good air entry Abdomen:  Soft, nontender, bowel sounds present Musculoskeletal:  NO edema, lower extremities Skin:  Intact  Active Problems:  Syncope  Nasopharyngeal carcinoma  Bradycardia  Hypotension  DVT (deep venous thrombosis)  Coagulopathy  ASSESSMENT AND PLAN  PULMONARY  Lab 09/12/11 1234  PHART 7.603*  PCO2ART 21.0*  PO2ART 126.0*  HCO3 20.8  O2SAT 99.0   Ventilator Settings:   CXR:  6/11 >>> NAD VQ:  5/30 >>> Low probability for PE CT angio 6/12>> negative for PE ETT:  NA  A:  Episodic dyspnea associated with bradycardia / hypotension.  Recent VQ  scan negative for PE and now CT.  Appropriately anticoagulated for DVT. P:   Supplemental oxygen Goal SpO2 > 92% CXR reviewed, no repeat needed  CARDIOVASCULAR  Lab 09/11/11 2200 09/11/11 1420 09/11/11 0412  TROPONINI <0.30 <0.30 <0.30  LATICACIDVEN -- -- --  PROBNP -- -- --   ECG:  6/11 >>> Sinus bradycardia, no ST-T changes TTE:  5/30 >>> EF 55-60%, no wall motion abnormalities, normal valves Carotid Doppler:  5/30 >>> No significant ICA stenosis LE venous Doppler:  5/30 >>> Findings consistent with acute deep vein thrombosis involving the popliteal and peroneal veins of both the right and left lower extremity. ECHO 6/12>>> EF 65%, RV normal Lines: R IJ 6/12>>> CT neck 6/12: Large left posterior nasopharyngeal mass consistent with carcinoma. No significant progression compared with prior PET scan.  A: Symptomatic bradycardia associated with hypotension / syncope, suspect related to increased vagal tone related to skull base tumor invasion / carotid bulb?Marland Kitchen Meets criteria AI Dry from vomiting contribution P:  Telemetry Dopamine gtt, goal HR > 60, MAP 65, will continue to try to wean, was off then back on for drop in BP without brady association May need repeat cvp as drop was in BP, may consider vaso addition ( vaso deficiency in setting of over a week of pressor needs) Reviewed case w Dr Donnie Aho Precious Gilding may be an option here for heart rate, will hold off on that for now Decadron 6 mg q6h, ? wean slowly but remain with equivalent stress dosing until off  pressors May consider addition florinef for prolonged AI Chemo x 5 days planned, today  RENAL  Lab 09/16/11 0328 09/15/11 0500 09/14/11 0437 09/13/11 0445 09/12/11 0446  NA 134* 136 137 136 136  K 4.9 3.9 -- -- --  CL 105 104 108 103 103  CO2 25 24 23 23 23   BUN 18 17 15  24* 17  CREATININE 0.70 0.58 0.60 0.82 0.58  CALCIUM 8.0* 8.0* 7.3* 8.9 8.9  MG -- -- -- -- 2.2  PHOS -- -- -- -- --   Intake/Output      06/16 0701  - 06/17 0700 06/17 0701 - 06/18 0700   P.O.     I.V. (mL/kg) 2477.3 (31.5)    IV Piggyback 602    Total Intake(mL/kg) 3079.3 (39.2)    Urine (mL/kg/hr) 3200 (1.7)    Total Output 3200    Net -120.7         Stool Occurrence 1 x     Foley:  NA  A:  Appears pre renal, but high UOP - auto-diuresing P:   Maintenance IVF NS 75 mL/h, want to see a cvp, unclear if we need this maintenance at this stage cvp Urine osm,na  GASTROINTESTINAL  Lab 09/14/11 0437  AST 35  ALT 108*  ALKPHOS 50  BILITOT 1.1  PROT 5.1*  ALBUMIN 2.5*    A:  N / vom, tolerated diet P:   Advance diet zofran steroids  HEMATOLOGIC  Lab 09/16/11 0328 09/15/11 0611 09/15/11 0500 09/14/11 0437 09/13/11 0445 09/12/11 0446 09/10/11 2329  HGB 14.2 -- 14.6 14.2 14.9 15.2 --  HCT 39.5 -- 40.8 39.2 41.9 42.5 --  PLT 226 -- 273 286 315 309 --  INR 1.09 1.24 -- 1.70* 2.23* 2.42* --  APTT -- -- -- -- -- -- 29   A:  Coumadin induced coagulopathy.  Appropriate INR. P:  Heparin gtt Start coumadin today 6/16   INFECTIOUS  Lab 09/16/11 0328 09/15/11 0500 09/14/11 0437 09/13/11 0445 09/12/11 0446  WBC 13.6* 15.9* 19.4* 24.1* 16.1*  PROCALCITON -- -- -- -- --   Cultures: NA Antibiotics: NA  A:  Steroid induced leukocytosis. History of splenectomy, afebrile P:   CT no abscess No abx  ENDOCRINE  Lab 09/16/11 0759 09/15/11 1716 09/15/11 1235 09/15/11 0723 09/14/11 2155  GLUCAP 96 98 102* 104* 109*   A:  Presum  Rel AI, cbg wnl P:   Was on pred pre ICU, will need decadron to continue cbg added, dc ssi Will consider florinef addition  NEUROLOGIC  Brain MRI:  5/30 >>> Left para pharyngeal mass is compatible with carcinoma. There is invasion of the skull base and suggestion of mild progression since the recent MRI. There is a left mastoid sinus effusion due to obstruction of the eustachian tube. Correlation with biopsy is suggested.  A:  Syncope, likely secondary to bradycardia / hypotension.   Nasopharyngeal carcinoma with skull base invasion.  Followed at [facility]  First radiation treatment on 6/11.  Chemotherapy planned at [facility]  P:   Decadron as ordered Oxycodone PRN pain Consider evolving DI if UOP remains high, follow BMP, although Na wnl, assess volume status  BEST PRACTICE / DISPOSITION Level of Care:  ICU >> change to SDU status 6/16 Primary Service:  PCCM Consultants: Cards, med onc, rad onc Code Status:  Full Diet:  Reg 6/14 DVT Px:  Heparin drip > start coumadin 6/16 GI Px:  Protonix Skin Integrity:  Intact Social / Family:  Updated, wife  Lavon Paganini. Titus Mould, MD, [place] Pgr: [place] [facility]

## 2011-09-16 NOTE — Progress Notes (Signed)
Subjective:  61 year old with nasopharyngeal carcinoma, bilateral DVT lower extremity, repeat syncopal episodes likely vasovagal as a result of carcinoma.  Laying comfortably in bed, wife at bedside. Feeling well/improved. He has not had a syncopal episode in a few days he states. No significant bradycardia noted on monitor. When weaning dopamine off, his blood pressure diminished to the 80s however his heart rate remained mildly bradycardic in the 50s. Currently he is sinus rhythm in the 60s/upper 50s.  Objective:  Vital Signs in the last 24 hours: Temp:  [97.9 F (36.6 C)-98.3 F (36.8 C)] 98.1 F (36.7 C) (06/17 0800) Pulse Rate:  [44-92] 61  (06/17 0800) Resp:  [15-24] 17  (06/17 0800) BP: (80-118)/(28-69) 116/68 mmHg (06/17 0800) SpO2:  [90 %-98 %] 94 % (06/17 0800)  Intake/Output from previous day: 06/16 0701 - 06/17 0700 In: 3079.3 [I.V.:2477.3; IV Piggyback:602] Out: 3200 [Urine:3200]   Physical Exam: General: Well developed, well nourished, in no acute distress. Head:  Normocephalic and atraumatic. Lungs: Clear to auscultation and percussion. Heart: Normal S1 and S2.  No murmur, rubs or gallops.  Abdomen: soft, non-tender, positive bowel sounds. Extremities: No clubbing or cyanosis. No edema. Neurologic: Alert and oriented x 3.    Lab Results:  Basename 09/16/11 0328 09/15/11 0500  WBC 13.6* 15.9*  HGB 14.2 14.6  PLT 226 273    Basename 09/16/11 0328 09/15/11 0500  NA 134* 136  K 4.9 3.9  CL 105 104  CO2 25 24  GLUCOSE 102* 109*  BUN 18 17  CREATININE 0.70 0.58   No results found for this basename: TROPONINI:2,CK,MB:2 in the last 72 hours Hepatic Function Panel  Basename 09/14/11 0437  PROT 5.1*  ALBUMIN 2.5*  AST 35  ALT 108*  ALKPHOS 50  BILITOT 1.1  BILIDIR --  IBILI --  Imaging: Dg Chest Port 1 View  09/15/2011  *RADIOLOGY REPORT*  Clinical Data: Evaluate for pulmonary edema  PORTABLE CHEST - 1 VIEW  Comparison: 09/14/2011; 09/11/2011;  08/28/2011 chest CT - 09/11/2011  Findings: Grossly unchanged cardiac silhouette and mediastinal contours.  Stable position of support apparatus.  There is persistent mild elevation of the right hemidiaphragm.  No new focal airspace opacities.  No definite pleural effusion or pneumothorax. Unchanged bones.  Surgical clips overlie the left supraclavicular fossa.  IMPRESSION: Stable examination without definite evidence of pulmonary edema.  Original Report Authenticated By: Waynard Reeds, M.D.    Telemetry: As above, no pauses Personally viewed.    Assessment/Plan:  Active Problems:  Syncope  Nasopharyngeal carcinoma  Bradycardia  Hypotension  DVT (deep venous thrombosis)  Coagulopathy  61 year old with nasopharyngeal carcinoma, bilateral DVT lower extremity, repeat syncopal episodes likely vasovagal as a result of carcinoma.  Vasovagal syncope  - Likely a result of carotid bulb stimulation/increased vagal response secondary to nasopharyngeal carcinoma invasion.  - He does not appear to have a purely cardioinhibitory response. At times he does become bradycardic however previously he was noted to have blood pressures within the normal range during bradycardic episodes as well as blood pressures in the decreased range during bradycardic episodes. He is also had hypotension during normal sinus rhythm. Vasopressor affect as well.  - Currently maintaining hydration, fludrocortisone, steroid. Since addition of fludrocortisone his symptoms have improved.  - Next idea, could consider Midrin 5-10 twice to three times a day, alpha agonist, however its efficacy in reflex syncope is uncertain. Spoke with Lewayne Bunting of electrophysiology via phone conversation regarding further medication use.  - Challenging situation  Bradycardia  - Transient, as above  Hypotension  - Transient, as above. Aggressive fluid hydration, fludrocortisone, steroid. Weaning dopamine.  DVT  - Anticoagulate  Shane Marsh,  Shane Marsh 09/16/2011, 9:25 AM

## 2011-09-16 NOTE — Progress Notes (Signed)
eLink Physician-Brief Progress Note Patient Name: Shane Marsh DOB: January 23, 1951 MRN: [id number]  Date of Service  09/16/2011   HPI/Events of Note    Lab 09/16/11 1840 09/16/11 0328 09/15/11 0500 09/14/11 0437 09/13/11 0445  K 3.4* 4.9 3.9 3.4* 4.3   HR 50 BP MAP 79  eICU Interventions  Replete kcl Check mag and phos   Intervention Category Intermediate Interventions: Electrolyte abnormality - evaluation and management  Roben Schliep 09/16/2011, 9:56 PM

## 2011-09-16 NOTE — Progress Notes (Signed)
ANTICOAGULATION CONSULT NOTE - Follow Up Consult  Pharmacy Consult for Heparin/Coumadin Indication: History of DVT  Allergies  Allergen Reactions  . Morphine And Related Nausea And Vomiting    Patient Measurements: Height: 5\' 8"  (172.7 cm) Weight: 173 lb 4.5 oz (78.6 kg) IBW/kg (Calculated) : 68.4  Heparin Dosing Weight: 80.4kg.  Vital Signs: Temp: 97.9 F (36.6 C) (06/16 2200) Temp src: Oral (06/16 2200) BP: 107/64 mmHg (06/17 0600) Pulse Rate: 60  (06/17 0600)  Labs:  Basename 09/16/11 0328 09/15/11 2030 09/15/11 1400 09/15/11 0611 09/15/11 0500 09/14/11 0437  HGB 14.2 -- -- -- 14.6 --  HCT 39.5 -- -- -- 40.8 39.2  PLT 226 -- -- -- 273 286  APTT -- -- -- -- -- --  LABPROT 14.3 -- -- 15.9* -- 20.3*  INR 1.09 -- -- 1.24 -- 1.70*  HEPARINUNFRC 0.36 0.64 0.69 -- -- --  CREATININE 0.70 -- -- -- 0.58 0.60  CKTOTAL -- -- -- -- -- --  CKMB -- -- -- -- -- --  TROPONINI -- -- -- -- -- --    Estimated Creatinine Clearance: 96.2 ml/min (by C-G formula based on Cr of 0.7).   Medications:  Scheduled:    . dexamethasone  6 mg Intravenous Q6H  . FLUOROURACIL (ADRUCIL) CHEMO infusion For Inpatient Use  750 mg/m2/day (Treatment Plan Actual) Intravenous Q24H  . insulin aspart  0-9 Units Subcutaneous TID WC  . pantoprazole  40 mg Oral QHS  . warfarin  5 mg Oral ONCE-1800  . Warfarin - Pharmacist Dosing Inpatient   Does not apply q1800   Infusions:    . sodium chloride 75 mL/hr at 09/15/11 2122  . DOPamine 2 mcg/kg/min (09/16/11 1914)  . heparin 500 Units/hr (09/16/11 0600)  . DISCONTD: heparin 550 Units/hr (09/15/11 2100)    Assessment:  38 YOM with nasopharyngeal carcinoma s/p radation 6/11 admitted with bradycardia, dyspnea, hypotension, and new syncope on Coumadin for bilateral DVT (5/30) prior to admission.   Coumadin 5mg  daily PTA (INR therapeutic on admission). Was initially held on admission, resumed 6/16 since no invasive procedure planned.  IV heparin bridge  until INR therapeutic, HL in goal this am.  INR subtherapeutic as expected after 1st dose.  No bleeding/complications reported.  Noted interaction with 5-FU: hypoprothrombinemic effects of warfarin may be increased with concurrent fluorouracil. Today is day #4/5 therapy.   Goal of Therapy:  INR 2-3 Heparin level 0.3-0.7 units/ml Monitor platelets by anticoagulation protocol: Yes   Plan:   Continue heparin 500 units/hr  Daily heparin level, CBC  Repeat coumadin 5mg  today  Daily PT/INR   Loralee Pacas, PharmD, BCPS Pager: [phone] 09/16/2011,7:51 AM

## 2011-09-16 NOTE — Progress Notes (Signed)
Nutrition Follow-up  Some nausea-receiving meds for this.  Poor appetite and intake.  Breakfast tray in room with small amount po.  Pt has not yet ordered Lunch.  Has never had supplements before.  Does not want anything creamy at this time.  Willing to try [employer].  Diet Order:  Regular  Meds: Scheduled Meds:   . dexamethasone  4 mg Intravenous Q6H  . fludrocortisone  0.1 mg Oral Daily  . FLUOROURACIL (ADRUCIL) CHEMO infusion For Inpatient Use  750 mg/m2/day (Treatment Plan Actual) Intravenous Q24H  . pantoprazole  40 mg Oral QHS  . warfarin  5 mg Oral ONCE-1800  . warfarin  5 mg Oral ONCE-1800  . Warfarin - Pharmacist Dosing Inpatient   Does not apply q1800  . DISCONTD: dexamethasone  6 mg Intravenous Q6H  . DISCONTD: insulin aspart  0-9 Units Subcutaneous TID WC   Continuous Infusions:   . sodium chloride 1,000 mL (09/16/11 1026)  . DOPamine 2 mcg/kg/min (09/16/11 0981)  . heparin 500 Units/hr (09/16/11 0600)  . DISCONTD: heparin 550 Units/hr (09/15/11 2100)   PRN Meds:.acetaminophen, ALPRAZolam, ondansetron (ZOFRAN) IV, oxyCODONE, sodium chloride, sodium chloride  Labs:  CMP     Component Value Date/Time   NA 134* 09/16/2011 0328   K 4.9 09/16/2011 0328   CL 105 09/16/2011 0328   CO2 25 09/16/2011 0328   GLUCOSE 102* 09/16/2011 0328   BUN 18 09/16/2011 0328   CREATININE 0.70 09/16/2011 0328   CALCIUM 8.0* 09/16/2011 0328   PROT 5.1* 09/14/2011 0437   ALBUMIN 2.5* 09/14/2011 0437   AST 35 09/14/2011 0437   ALT 108* 09/14/2011 0437   ALKPHOS 50 09/14/2011 0437   BILITOT 1.1 09/14/2011 0437   GFRNONAA >90 09/16/2011 0328   GFRAA >90 09/16/2011 0328     Intake/Output Summary (Last 24 hours) at 09/16/11 1221 Last data filed at 09/16/11 0900  Gross per 24 hour  Intake   2331 ml  Output   3225 ml  Net   -894 ml    Weight Status:  78.6 kg- continued decrease since admit  Re-estimated needs:  2100-2300 kcal, 96-120 g protein  Nutrition Dx:  Increased nutrient  needs-ongoing  Goal:  PO intake to promote weight maintenance-not yet met  Intervention:  Continue current diet.  Encouraged po and need for good nutrition with treatment.  Add [employer] tid  Monitor:  PO intake, weight, labs, i/o   Shane Marsh Pager #:  [phone]

## 2011-09-16 NOTE — Consult Note (Signed)
[facility] INPATIENT PROGRESS NOTE  Name: Shane Marsh      MRN: [id number]    Location: 1223/1223-01  Date: 09/16/2011 Time:9:56 AM   Subjective: Interval History:Shane Marsh reports feeling relatively well.  He had nausea, fatigue with the triple chemo on the first day.  He still has diplopia.  He was able to walk to bathroom without problem.  He denies nausea/vomiting today.  He denies fever, mouth sore, shortness of breath, chest pain, palpitation, syncope, skin rash, diarrhea.  Objective: Vital signs in last 24 hours: Temp:  [97.9 F (36.6 C)-98.3 F (36.8 C)] 98.1 F (36.7 C) (06/17 0800) Pulse Rate:  [44-92] 61  (06/17 0800) Resp:  [15-24] 17  (06/17 0800) BP: (80-118)/(28-69) 116/68 mmHg (06/17 0800) SpO2:  [90 %-98 %] 94 % (06/17 0800)    Intake/Output from previous day: 06/16 0701 - 06/17 0700 In: 3079.3 [I.V.:2477.3] Out: 3200 [Urine:3200]    PHYSICAL EXAM:  General: well-nourished man in no acute distress. Eyes: no scleral icterus. ENT: There were no oropharyngeal lesions on my unaided exam. Neck was without thyromegaly. There was fullness in the left cervical neck but no discreet mass. Lymphatics: Negative cervical, supraclavicular or axillary adenopathy. Respiratory: lungs were clear bilaterally without wheezing or crackles. Cardiovascular: Regular rate and rhythm, S1/S2, without murmur, rub or gallop. There was no pedal edema. GI: abdomen was soft, flat, nontender, nondistended, without organomegaly. Muscoloskeletal: no spinal tenderness of palpation of vertebral spine. Skin exam was without echymosis, petichae.  Labs:  Reviewed.   MEDICATIONS: reviewed.     Assessment/Plan:  1. 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  - He is s/p  Cisplatin/Taxotere bolus on Friday 09/10/11.  He has been on 24hour CI of 5FU (days 1 through 5) due to finish late tomorrow.  - He has had mild nausea/vomiting as expected from chemo.  He has compazine/Zofran prn.   2. 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  Once out patient, I will convert Dexamethasone IV to PO.    3. Bilateral DVT's (dx in 07/2011): On heparin IV gtt protocol.  I will convert this to Lovenox upon discharge.  With chemotherapy, he will have thrombocytopenia.  Thus, I prefer Lovenox over Coumadin.   4. Leukocytosis: Due to dexamethasone. There is no focal source of infection.   5.  Access:  I placed a consult for IR for placement of portacath.  Future cycles of chemo will be given out patient.  Right now, he is receiving chemo via IJ catheter which will be pulled upon discharge from this current admission.

## 2011-09-16 NOTE — Progress Notes (Signed)
Aware of request for port placement for chemotherapy for recurrent oropharyngeal carcinoma. After review of chart and discussion with PCCM, pt not presently stable enough for procedure.  He will be on Heparin or Lovenox which could be held prior to procedure.  As pt status improves and stabilizes, we could consider port prior to discharge. Will touch base with PCCM and Onc later this week. Thanks.  Brayton El 09/16/2011 2:24 PM

## 2011-09-17 ENCOUNTER — Encounter: Payer: Self-pay | Admitting: Hematology and Oncology

## 2011-09-17 LAB — PROTIME-INR: Prothrombin Time: 18.2 seconds — ABNORMAL HIGH (ref 11.6–15.2)

## 2011-09-17 LAB — CBC
Hemoglobin: 13.9 g/dL (ref 13.0–17.0)
MCH: 31.1 pg (ref 26.0–34.0)
MCHC: 35.5 g/dL (ref 30.0–36.0)
Platelets: 184 10*3/uL (ref 150–400)
RDW: 13.1 % (ref 11.5–15.5)

## 2011-09-17 LAB — GLUCOSE, CAPILLARY: Glucose-Capillary: 158 mg/dL — ABNORMAL HIGH (ref 70–99)

## 2011-09-17 LAB — PHOSPHORUS: Phosphorus: 1.9 mg/dL — ABNORMAL LOW (ref 2.3–4.6)

## 2011-09-17 LAB — BASIC METABOLIC PANEL
CO2: 23 mEq/L (ref 19–32)
Calcium: 7.9 mg/dL — ABNORMAL LOW (ref 8.4–10.5)
Creatinine, Ser: 0.61 mg/dL (ref 0.50–1.35)

## 2011-09-17 LAB — HEPARIN LEVEL (UNFRACTIONATED)
Heparin Unfractionated: 0.18 IU/mL — ABNORMAL LOW (ref 0.30–0.70)
Heparin Unfractionated: 0.15 IU/mL — ABNORMAL LOW (ref 0.30–0.70)

## 2011-09-17 LAB — SODIUM, URINE, RANDOM: Sodium, Ur: 137 mEq/L

## 2011-09-17 LAB — OSMOLALITY, URINE: Osmolality, Ur: 497 mOsm/kg (ref 390–1090)

## 2011-09-17 MED ORDER — VASOPRESSIN 20 UNIT/ML IJ SOLN
0.0200 [IU]/min | INTRAVENOUS | Status: DC
  Administered 2011-09-17: 0.03 [IU]/min via INTRAVENOUS
  Filled 2011-09-17: qty 2.5

## 2011-09-17 MED ORDER — DEXAMETHASONE SODIUM PHOSPHATE 4 MG/ML IJ SOLN
4.0000 mg | Freq: Four times a day (QID) | INTRAMUSCULAR | Status: DC
  Administered 2011-09-17 – 2011-09-19 (×7): 4 mg via INTRAVENOUS
  Filled 2011-09-17 (×10): qty 1

## 2011-09-17 MED ORDER — WARFARIN SODIUM 5 MG PO TABS
5.0000 mg | ORAL_TABLET | Freq: Once | ORAL | Status: DC
  Filled 2011-09-17: qty 1

## 2011-09-17 MED ORDER — SODIUM PHOSPHATE 3 MMOLE/ML IV SOLN
30.0000 mmol | Freq: Once | INTRAVENOUS | Status: AC
  Administered 2011-09-17: 30 mmol via INTRAVENOUS
  Filled 2011-09-17: qty 10

## 2011-09-17 NOTE — Progress Notes (Signed)
Talked with Dr. Marchelle Gearing (E-link), due to pt's bradycardia HR in the mid 50's, has order for Dopamine to keep HR above 65, however that was weaned off during the day and pt remains asymptomatic with good BP.  Dr. Marchelle Gearing advised to currently hold off on restarting the dopamine if pt asymptomatic with good BP.  However if pt becomes symptomatic or drops lower than 50's then restart dopamine for low heart rate.

## 2011-09-17 NOTE — Progress Notes (Signed)
Pt states he feels like he an't catch my breath but he looks at monitor and knows his oxygen saturation is fine and that he is breathing. Pt states this feeling has preceding syncopal episodes in past, and is accompanied by a cold feeling in his throat, but the cold feeling is not currently happening. Pt states that lying flat works best for him in alleviating the feeling of not being able to breathe. Pt is currently hemodynamically stable. Will continue to monitor.

## 2011-09-17 NOTE — Progress Notes (Addendum)
ANTICOAGULATION CONSULT NOTE - Follow Up Consult  Pharmacy Consult for Heparin/Coumadin Indication: History of DVT  Allergies  Allergen Reactions  . Morphine And Related Nausea And Vomiting    Patient Measurements: Height: 5\' 8"  (172.7 cm) Weight: 173 lb 4.5 oz (78.6 kg) IBW/kg (Calculated) : 68.4  Heparin Dosing Weight: 80.4kg.  Vital Signs: Temp: 98.9 F (37.2 C) (06/18 0400) Temp src: Oral (06/18 0400) BP: 125/59 mmHg (06/18 0645) Pulse Rate: 70  (06/18 0645)  Labs:  Basename 09/17/11 0330 09/16/11 1840 09/16/11 0328 09/15/11 2030 09/15/11 0611 09/15/11 0500  HGB 13.9 -- 14.2 -- -- --  HCT 39.2 -- 39.5 -- -- 40.8  PLT 184 -- 226 -- -- 273  APTT -- -- -- -- -- --  LABPROT 18.2* -- 14.3 -- 15.9* --  INR 1.48 -- 1.09 -- 1.24 --  HEPARINUNFRC 0.18* -- 0.36 0.64 -- --  CREATININE 0.61 0.54 0.70 -- -- --  CKTOTAL -- -- -- -- -- --  CKMB -- -- -- -- -- --  TROPONINI -- -- -- -- -- --    Estimated Creatinine Clearance: 96.2 ml/min (by C-G formula based on Cr of 0.61).   Medications:  Scheduled:     . dexamethasone  4 mg Intravenous Q6H  . feeding supplement  1 Container Oral TID BM  . fludrocortisone  0.1 mg Oral Daily  . FLUOROURACIL (ADRUCIL) CHEMO infusion For Inpatient Use  750 mg/m2/day (Treatment Plan Actual) Intravenous Q24H  . pantoprazole  40 mg Oral QHS  . potassium chloride  40 mEq Oral Once  . sodium phosphate  Dextrose 5% IVPB  30 mmol Intravenous Once  . warfarin  5 mg Oral ONCE-1800  . Warfarin - Pharmacist Dosing Inpatient   Does not apply q1800  . DISCONTD: dexamethasone  6 mg Intravenous Q6H  . DISCONTD: insulin aspart  0-9 Units Subcutaneous TID WC   Infusions:     . sodium chloride 1,000 mL (09/16/11 1026)  . DOPamine 3 mcg/kg/min (09/17/11 4782)  . 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   Coumadin 5mg  daily PTA (INR therapeutic on admission). Was initially held on admission, resumed 6/16 since no invasive procedure planned.  IV heparin bridge until INR therapeutic, HL subtherapeutic this am.  INR subtherapeutic but responding as expected after 2 doses (5mg  on 6/16 and 6/17).  No bleeding/complications reported.  Noted interaction with 5-FU: hypoprothrombinemic effects of warfarin may be increased with concurrent fluorouracil. Today is day #5/5 chemotherapy.   Goal of Therapy:  INR 2-3 Heparin level 0.3-0.7 units/ml Monitor platelets by anticoagulation protocol: Yes   Plan:   Increase heparin to 600 units/hr  Recheck level in 6hrs  Daily heparin level, CBC  Repeat coumadin 5mg  today  Daily PT/INR  Note: Oncology prefers Lovenox over Coumadin therapy due to thrombocytopenia from chemo. No orders yet since planning PAC placement.  Loralee Pacas, PharmD, BCPS Pager: [phone] 09/17/2011,6:54 AM   ADDENDUM: 09/17/2011 4:10 PM Heparin level: 0.15  A/P: Heparin level decreased despite increasing the rate of infusion to 600 units/hr.  Will increase heparin rate to 750 units/hr (7.5 mL/hr).  Recheck HL in 6 hours.  Clance Boll, PharmD, BCPS Pager: [phone] 09/17/2011 4:11 PM

## 2011-09-17 NOTE — Consult Note (Signed)
[facility] INPATIENT PROGRESS NOTE  Name: Shane Marsh      MRN: [id number]    Location: 1223/1223-01  Date: 09/17/2011 Time:4:54 PM   Subjective: Interval History:Shloima Gaillard reported feeling tired.  He did not get much rest yesterday.  He still has left retro orbital pressure.  He denied fever, mucositis, nausea/vomiting, abdominal pain, bleeding symptoms.   Objective: Vital signs in last 24 hours: Temp:  [98.1 F (36.7 C)-98.9 F (37.2 C)] 98.5 F (36.9 C) (06/18 1600) Pulse Rate:  [50-109] 67  (06/18 1600) Resp:  [13-19] 16  (06/18 1600) BP: (100-135)/(53-79) 107/69 mmHg (06/18 1600) SpO2:  [81 %-99 %] 95 % (06/18 1600)    Intake/Output from previous day: 06/17 0701 - 06/18 0700 In: 2083.9 [I.V.:1481.9] Out: 4535 [Urine:4535]     PHYSICAL EXAM: General: well-nourished man in no acute distress. Eyes: no scleral icterus. ENT: There were no oropharyngeal lesions on my unaided exam. Neck was without thyromegaly. There was fullness in the left cervical neck but no discreet mass. Lymphatics: Negative cervical, supraclavicular or axillary adenopathy. Respiratory: lungs were clear bilaterally without wheezing or crackles. Cardiovascular: Regular rate and rhythm, S1/S2, without murmur, rub or gallop. There was no pedal edema. GI: abdomen was soft, flat, nontender, nondistended, without organomegaly. Muscoloskeletal: no spinal tenderness of palpation of vertebral spine. Skin exam was without echymosis, petichae.   Studies/Results: reviewed.   MEDICATIONS: reviewed.     Assessment/Plan:    1. 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  - He is s/p Cisplatin/Taxotere bolus on Friday 09/10/11. He has been on 24hour CI of 5FU (days 1 through 5) due to finish  late Wednesday night.  Tonight will be the beginning of the last 24 hour 5FU infusion this cycle.  - He has had mild nausea/vomiting as expected from chemo. He has compazine/Zofran prn.   2. 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 Once out patient, I will convert Dexamethasone IV to PO.  He had a trial off of Dopamine yesterday with recurrent episodes of symptomatic bradycardia and hypotension.  - I personally discussed the case with Dr. Lonie Peak ([facility]), and Dr. Elaina Pattee (my head/neck cancer med onc colleague from [facility]).  Response from induction chemo may takes 1-2 months.  He is at very high risk of recurrent head/neck cancer; and thus, would benefit from induction chemotherapy prior to starting concurrent chemoradiation. - Since response from chemo is delayed, and he has recurrent symptomatic bradycardia and at times hypotension, I appreciate PCCM and Cardiology/EP to see if placement of pacemaker or other oral meds are indicated to get him out of recurrent symptomatic bradycardia.   3. Bilateral DVT's (dx in 07/2011): On heparin IV gtt protocol. I will convert this to Lovenox upon discharge. With chemotherapy, he will have thrombocytopenia. Thus, I prefer Lovenox over Coumadin.   4. Leukocytosis: Due to dexamethasone. There is no focal source of infection.   5. Access: I placed a consult for IR for placement of portacath during this admission when his is more stable. Future cycles of chemo will be given out patient. Right now, he is receiving chemo via IJ catheter which will be pulled upon discharge from this current admission.

## 2011-09-17 NOTE — Progress Notes (Signed)
SUBJECTIVE:  Heart rate and BP stable this am.  No further syncopal episodes but did have an episode of lightheadedness last PM that lasted about 30 minutes with no significant change in BP or HR but dopamine readded to prevent any hypotensive BPs  OBJECTIVE:   Vitals:   Filed Vitals:   09/17/11 0630 09/17/11 0645 09/17/11 0700 09/17/11 0800  BP: 119/67 125/59 103/61   Pulse: 50 70 84   Temp:    98.1 F (36.7 C)  TempSrc:    Oral  Resp: 16 13 18    Height:      Weight:      SpO2: 93% 95% 81%    I&O's:   Intake/Output Summary (Last 24 hours) at 09/17/11 0847 Last data filed at 09/17/11 0700  Gross per 24 hour  Intake 1917.18 ml  Output   4035 ml  Net -2117.82 ml   TELEMETRY: Reviewed telemetry pt in NSR     PHYSICAL EXAM General: Well developed, well nourished, in no acute distress Head: Eyes PERRLA, No xanthomas.   Normal cephalic and atramatic  Lungs:   Clear bilaterally to auscultation and percussion. Heart:   HRRR S1 S2 Pulses are 2+ & equal. Abdomen: Bowel sounds are positive, abdomen soft and non-tender without masses  Extremities:   No clubbing, cyanosis or edema.  DP +1 Neuro: Alert and oriented X 3. Psych:  Good affect, responds appropriately   LABS: Basic Metabolic Panel:  Basename 09/17/11 0330 09/16/11 1840  NA 134* 135  K 4.0 3.4*  CL 104 105  CO2 23 22  GLUCOSE 97 106*  BUN 17 18  CREATININE 0.61 0.54  CALCIUM 7.9* 7.6*  MG 2.2 --  PHOS 1.9* --   Liver Function Tests: No results found for this basename: AST:2,ALT:2,ALKPHOS:2,BILITOT:2,PROT:2,ALBUMIN:2 in the last 72 hours No results found for this basename: LIPASE:2,AMYLASE:2 in the last 72 hours CBC:  Basename 09/17/11 0330 09/16/11 0328  WBC 13.8* 13.6*  NEUTROABS -- --  HGB 13.9 14.2  HCT 39.2 39.5  MCV 87.7 88.2  PLT 184 226   Coag Panel:   Lab Results  Component Value Date   INR 1.48 09/17/2011   INR 1.09 09/16/2011   INR 1.24 09/15/2011    RADIOLOGY: Dg  Orthopantogram  08/30/2011  *RADIOLOGY REPORT*  Clinical Data: No evaluation prior to radiation therapy.  ORTHOPANTOGRAM/PANORAMIC  Comparison: None.  Findings: Teeth numbers one and 16 are absent.  No active pathology evident relating to the maxillary dentition.  Teeth numbers 17, 18 and 32 are absent.  There is some periodontal disease related to tooth number 31.  No other significant finding.  IMPRESSION: Missing teeth as above.  Some periodontal disease tooth number 31.  Original Report Authenticated By: Thomasenia Sales, M.D.   Ct Soft Tissue Neck W Contrast  09/11/2011  *RADIOLOGY REPORT*  Clinical Data: Nasopharyngeal carcinoma.  CT NECK WITH CONTRAST  Technique:  Multidetector CT imaging of the neck was performed with intravenous contrast.  Contrast: OMNIPAQUE IOHEXOL 350 MG/ML SOLN  Comparison: PET scan 09/04/2011.  MR head 08/29/2011.  Findings: Large left posterior nasopharyngeal mass with extension across the midline, into the skull base, and laterally extending to the parapharyngeal and carotid space.  Exact measurement of the mass is difficult due to its pleomorphic nature.  At the level of the foramen magnum, cross-sectional measurements are 41 x 51 mm. At the level of C2, where the mass extends more laterally, the mass measures 24 x 84 mm.  Craniocaudal  extent is approximately 55 mm. There is fullness in the left cavernous sinus which on previous MR represents intracranial extension.  There is slight fullness enhancement of the dura on the left at the foramen magnum level also suggesting intraspinal/intracranial extension.  Clear-cut osseous destruction of the skull base is not seen although MR demonstrates skull base involvement.  The cavernous sinus extension may be via the foramen of Lacerum alongside the carotid artery.  Lateral extension of mass extends to the left parotid space deep lobe; (image 39 series 13) the mass is also contiguous with the pterygoids on the left.  There are  surgical clips in the neck just above the thyroid gland on the left.  This may represent previous biopsy.  Bilateral hypermetabolic levels III and left level II lymph nodes as described on PET scan are redemonstrated. It is possible that the large parapharyngeal mass on the left at the level of C2 represents a conglomerate mass of lymph nodes which is adjacent to the primary lesion.  The airway is not significantly compromised by this lesion.  The paranasal sinuses are clear.  Left mastoid effusion represents eustachian tube dysfunction.  Left internal carotid artery is surrounded by the tumor and narrowed.  (Image 40 series 13). Compared with prior PET scan and a most recent MR, the appearance is similar.  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 No significant progression compared with prior PET scan.  Original Report Authenticated By: Elsie Stain, M.D.   Ct Angio Chest W/cm &/or Wo Cm  09/11/2011  *RADIOLOGY REPORT*  Clinical Data: Bilateral DVT.  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  Syncopal episodes at home.Symptomatic bradycardia.  CT ANGIOGRAPHY CHEST  Technique:  Multidetector CT imaging of the chest using the standard protocol during bolus administration of intravenous contrast. Multiplanar reconstructed images including MIPs were obtained and reviewed to evaluate the vascular anatomy.  Contrast: OMNIPAQUE IOHEXOL 350 MG/ML SOLN  Comparison: None.  Findings: The heart is normal.  No enlargement, coronary calcification, or pericardial effusion.  No hilar or mediastinal lymphadenopathy.  Small right effusion with slight right lower lobe subsegmental atelectasis. No pulmonary nodules are seen.  Good opacification of the pulmonary vasculature.  No evidence for pulmonary embolic disease.  Unremarkable aorta and visualized great vessels.  No upper abdominal pathology.  No acute osseous findings.  IMPRESSION: No evidence for  pulmonary embolic disease.  Small right effusion and slight subsegmental atelectasis.  Original Report Authenticated By: Elsie Stain, M.D.   Mri Brain Without Contrast  08/29/2011  *RADIOLOGY REPORT*  Clinical Data: Back pain.  History of tonsillar carcinoma.  MRI HEAD WITHOUT CONTRAST  Technique:  Multiplanar, multiecho pulse sequences of the brain and surrounding structures were obtained according to standard protocol without intravenous contrast.  Comparison: MRI 08/05/2011  Findings: Negative for acute infarct.  Several old small white matter hyperintensities in the frontal white matter are unchanged most likely due to chronic microvascular ischemia.  Mild atrophy is present.  Ventricle size is normal.  Infiltrating mass lesion in the left neck is again identified. This is  surrounding the left internal carotid artery and extending posterior to the cervical carotid artery.  Mass extends to the para pharyngeal soft tissues and infiltrates the longus coli muscle and extends into the clivus.  There is thickening of the dura suggesting some epidural tumor.  There is also extension into the occipital bone on the left at the foramen magnum.  There may be some progression  of tumor in the skull base since the prior MRI. The primary mass measures 3.9 x 3.0 cm and may have progressed slightly in the interval.  There is a mastoid sinus effusion on the left due to obstruction of the eustachian tube.  IMPRESSION: Negative for acute infarct.  Left para pharyngeal mass is compatible with carcinoma.  There is invasion of the  skull base and suggestion of mild progression since the recent MRI.  There is a left mastoid sinus effusion due to obstruction of the eustachian tube.  Correlation with biopsy is suggested.  Original Report Authenticated By: Camelia Phenes, M.D.   Nm Pulmonary Perfusion  08/29/2011  *RADIOLOGY REPORT*  Clinical Data:  Shortness of breath and syncope.  Nasopharyngeal carcinoma.  NUCLEAR MEDICINE  PERFUSION LUNG SCAN  Technique:  Perfusion images were obtained in multiple projections after intravenous injection of radiopharmaceutical.  Radiopharmaceutical: CURIE MAA TECHNETIUM TO 4M ALBUMIN AGGREGATED  mCi Tc-53m MAA.  Comparison:  plain film of 08/28/2011.  Findings: No wedge-shaped perfusion defects to suggest pulmonary embolism.  IMPRESSION: Very low probability for pulmonary embolism.  Original Report Authenticated By: Consuello Bossier, M.D.   Nm Pet Image Initial (pi) Skull Base To Thigh  09/04/2011  *RADIOLOGY REPORT*  Clinical Data: Initial treatment strategy for nasopharyngeal carcinoma.  NUCLEAR MEDICINE PET SKULL BASE TO THIGH  Fasting Blood Glucose:  96  Technique:  17 mCi F-18 FDG was injected intravenously. CT data was obtained and used for attenuation correction and anatomic localization only.  (This was not acquired as a diagnostic CT examination.) Additional exam technical data entered on technologist worksheet.  Comparison:  08/29/2011  Findings:  Neck: Again identified is a large left parapharyngeal mass which is intensely hypermetabolic compatible with carcinoma.  This measures 2.5 x 6.6 by 6.6 cm and invades the skull base.  The SUV max associated this mass is equal to that 16.4.  Bilateral hypermetabolic cervical adenopathy is identified.  Index left level III lymph node measures 0.1 cm and has an SUV max equal to the 6.4, image 39.  Right level III node measures 1.5 cm and has an SUV max equal to 6.1.  Chest:  No hypermetabolic mediastinal or hilar nodes.  No suspicious pulmonary nodules on the CT scan.  Abdomen/Pelvis:  No abnormal hypermetabolic activity within the liver, pancreas, adrenal glands, or spleen.  No hypermetabolic lymph nodes in the abdomen or pelvis.  There is a soft tissue attenuating structure within the left upper quadrant of the abdomen is identified measuring 4.1 x 5.4 cm.  Likely splenule.  Advanced calcified atherosclerotic disease affects the abdominal  aorta and its branches.  The left common iliac artery measures 2.3 cm in diameter.  [place]:  No focal hypermetabolic activity to suggest skeletal metastasis.  IMPRESSION:  1.  Large left-sided parapharyngeal mass invading the skull base consistent with carcinoma. 2.  Bilateral cervical lymph node hypermetabolic adenopathy.  Original Report Authenticated By: Rosealee Albee, M.D.   Dg Chest Port 1 View  09/15/2011  *RADIOLOGY REPORT*  Clinical Data: Evaluate for pulmonary edema  PORTABLE CHEST - 1 VIEW  Comparison: 09/14/2011; 09/11/2011; 08/28/2011 chest CT - 09/11/2011  Findings: Grossly unchanged cardiac silhouette and mediastinal contours.  Stable position of support apparatus.  There is persistent mild elevation of the right hemidiaphragm.  No new focal airspace opacities.  No definite pleural effusion or pneumothorax. Unchanged bones.  Surgical clips overlie the left supraclavicular fossa.  IMPRESSION: Stable examination without definite evidence of pulmonary edema.  Original Report Authenticated By: Waynard Reeds, M.D.   Dg Chest Port 1 View  09/14/2011  *RADIOLOGY REPORT*  Clinical Data: Evaluate pulmonary edema, history of nasal and throat cancer  PORTABLE CHEST - 1 VIEW  Comparison: 09/11/2011; 09/10/18 13; chest CT - 09/11/2011  Findings: Grossly unchanged cardiac silhouette and mediastinal contours.  Stable positioning of support apparatus.  Minimal bibasilar opacities favored to represent atelectasis.  No definite pleural effusion or pneumothorax.   Surgical clips overlie the left supraclavicular fossa.  Unchanged bones.  IMPRESSION: Minimal bibasilar opacities favored to represent atelectasis.  No definite evidence of pulmonary edema.  Original Report Authenticated By: Waynard Reeds, M.D.   Dg Chest Port 1 View  09/11/2011  *RADIOLOGY REPORT*  Clinical Data: Line placement.  PORTABLE CHEST - 1 VIEW  Comparison: CT chest earlier this same day and plain film chest 09/10/2011.  Findings: New  right IJ catheter is in place with the tip in the lower superior vena cava.  No pneumothorax.  Lungs are clear. Heart size is normal.  Surgical clips base of the neck on the left noted.  IMPRESSION: IJ catheter in good position.  No pneumothorax or other acute finding.  Original Report Authenticated By: Bernadene Bell. Maricela Curet, M.D.   Dg Chest Port 1 View  09/11/2011  *RADIOLOGY REPORT*  Clinical Data: Shortness of breath.  Recent diagnosis of nasal pharyngeal carcinoma.  PORTABLE CHEST - 1 VIEW  Comparison: 08/28/2011 and PET of 09/04/2011.  Findings: Surgical clips at the left side of the thoracic inlet. Midline trachea.  Normal heart size.  No pleural effusion or pneumothorax.  Clear lungs.  IMPRESSION: No acute cardiopulmonary disease.  Original Report Authenticated By: Consuello Bossier, M.D.   Dg Chest Portable 1 View  08/28/2011  *RADIOLOGY REPORT*  Clinical Data: Hypotension.  PORTABLE CHEST - 1 VIEW  Comparison: Chest 08/06/2011.  Findings: Lungs are clear.  Heart size is normal.  No pneumothorax or pleural fluid.  Surgical clips base of the left neck noted.  IMPRESSION: No acute finding.  Original Report Authenticated By: Bernadene Bell. D'ALESSIO, M.D.   Dg Swallowing Func-no Report  08/29/2011  CLINICAL DATA: dysphagia   FLUOROSCOPY FOR SWALLOWING FUNCTION STUDY:  Fluoroscopy was provided for swallowing function study, which was  administered by a speech pathologist.  Final results and recommendations  from this study are contained within the speech pathology report.        ASSESSMENT:  1.  Nasopharyngeal carcinoma 2.  Bilateral DVT of LE 3.  Syncopal episodes felt to be vasovagal 4.  Bradycardia - currently HR in 90's  PLAN:   1.  Continue Hydration and Fludrocortisone 2.  Wean dopamine off  Quintella Reichert, MD  09/17/2011  8:47 AM

## 2011-09-17 NOTE — Progress Notes (Signed)
Name: Shane Marsh MRN: [id number] DOB: 02/10/1951    LOS: 7  Referring Provider:  EDP Reason for Referral:  Bradycardia / syncope   PULMONARY / CRITICAL CARE MEDICINE  Brief patient description:  61 y/o with nasopharyngeal carcinoma invading skull base and bilateral LE DVT on Coumadin admitted for bradycardia associated with dyspnea, hypotension and syncope.  Events Since Admission: 6/12  Admitted for bradycardia associated with dyspnea, hypotension and syncope 6/12- remains on pressors 6/13- remains on dopa 6/14- transferred to [facility] and first chemo given  Current Status:  Off then on dop again  Vital Signs: Temp:  [98.1 F (36.7 C)-98.9 F (37.2 C)] 98.1 F (36.7 C) (06/18 0800) Pulse Rate:  [50-84] 84  (06/18 0700) Resp:  [13-19] 18  (06/18 0700) BP: (103-135)/(53-76) 103/61 mmHg (06/18 0700) SpO2:  [81 %-98 %] 81 % (06/18 0700)  Intake/Output Summary (Last 24 hours) at 09/17/11 0910 Last data filed at 09/17/11 0700  Gross per 24 hour  Intake 1834.18 ml  Output   3460 ml  Net -1625.82 ml   Physical Examination: General: no distress Neuro: Alert and oriented, Left eye with double vision intermittent, patch to left eye HEENT:  Oropharynx edema left tonsillar Neck:  Supple, lymphadenopathy unchanged Cardiovascular:  Sinus bradycardia on monitor, no murmurs s1 s2  Lungs:  CTAB Abdomen:  Soft, nontender, bowel sounds present Musculoskeletal:  NO edema, lower extremities Skin:  Intact  Active Problems:  Syncope  Nasopharyngeal carcinoma  Bradycardia  Hypotension  DVT (deep venous thrombosis)  Coagulopathy  ASSESSMENT AND PLAN  PULMONARY  Lab 09/12/11 1234  PHART 7.603*  PCO2ART 21.0*  PO2ART 126.0*  HCO3 20.8  O2SAT 99.0   Ventilator Settings:   CXR:  6/11 >>> NAD VQ:  5/30 >>> Low probability for PE CT angio 6/12>> negative for PE ETT:  NA  A:  Episodic dyspnea associated with bradycardia / hypotension.  Recent VQ scan negative for PE and now CT.   Appropriately anticoagulated for DVT. P:   Supplemental oxygen  CARDIOVASCULAR  Lab 09/11/11 2200 09/11/11 1420 09/11/11 0412  TROPONINI <0.30 <0.30 <0.30  LATICACIDVEN -- -- --  PROBNP -- -- --   ECG:  6/11 >>> Sinus bradycardia, no ST-T changes TTE:  5/30 >>> EF 55-60%, no wall motion abnormalities, normal valves Carotid Doppler:  5/30 >>> No significant ICA stenosis LE venous Doppler:  5/30 >>> Findings consistent with acute deep vein thrombosis involving the popliteal and peroneal veins of both the right and left lower extremity. ECHO 6/12>>> EF 65%, RV normal Lines: R IJ 6/12>>> CT neck 6/12: Large left posterior nasopharyngeal mass consistent with carcinoma. No significant progression compared with prior PET scan.  A: Symptomatic bradycardia associated with hypotension / syncope, suspect related to increased vagal tone related to skull base tumor invasion / carotid bulb?Marland Kitchen Meets criteria AI P:  Telemetry Dopamine gtt, hope to dc today If dopmaine not dc'ed to off by noon- 1 pm, then will add vasopressin in hopes to dc all pressors in 24 hrs Decadron remain on today same dose Continue florinef Chemo x 5 days planned, today Only restart dop for symptomatic brady cvp accuracy in ?  RENAL  Lab 09/17/11 0330 09/16/11 1840 09/16/11 0328 09/15/11 0500 09/14/11 0437 09/12/11 0446  NA 134* 135 134* 136 137 --  K 4.0 3.4* -- -- -- --  CL 104 105 105 104 108 --  CO2 23 22 25 24 23  --  BUN 17 18 18 17 15  --  CREATININE  0.61 0.54 0.70 0.58 0.60 --  CALCIUM 7.9* 7.6* 8.0* 8.0* 7.3* --  MG 2.2 -- -- -- -- 2.2  PHOS 1.9* -- -- -- -- --   Intake/Output      06/17 0701 - 06/18 0700 06/18 0701 - 06/19 0700   I.V. (mL/kg) 1481.9 (18.9)    IV Piggyback 602    Total Intake(mL/kg) 2083.9 (26.5)    Urine (mL/kg/hr) 4035 (2.1)    Total Output 4035    Net -1951.1         Stool Occurrence 1 x     Foley:  NA  A:  Appears pre renal, but high UOP - auto-diuresing, hypophos, favor  iatrogenic autodiuresis P:   Maintenance IVF  cvp accuracy?  dc Urine osm,na - contradictory, urine na 137 = iatrogenic , doubt csw, salt wasting nephropathy kvo , follow output, allow neg balance  GASTROINTESTINAL  Lab 09/14/11 0437  AST 35  ALT 108*  ALKPHOS 50  BILITOT 1.1  PROT 5.1*  ALBUMIN 2.5*    A:  Prior N / vom, tolerated diet P:   Advance diet zofran Steroids remain  HEMATOLOGIC  Lab 09/17/11 0330 09/16/11 0328 09/15/11 0611 09/15/11 0500 09/14/11 0437 09/13/11 0445 09/10/11 2329  HGB 13.9 14.2 -- 14.6 14.2 14.9 --  HCT 39.2 39.5 -- 40.8 39.2 41.9 --  PLT 184 226 -- 273 286 315 --  INR 1.48 1.09 1.24 -- 1.70* 2.23* --  APTT -- -- -- -- -- -- 29   A:  Coumadin induced coagulopathy.  Appropriate INR. P:  Heparin gtt remain, can shut off and place catheter via IR once more hemodynamically stable, off pressors x 24 hrs Appreciated Dr Gaylyn Rong note for Lovenox at dc We should consider dc coumadin if outpt plan is to NOT use this  INFECTIOUS  Lab 09/17/11 0330 09/16/11 0328 09/15/11 0500 09/14/11 0437 09/13/11 0445  WBC 13.8* 13.6* 15.9* 19.4* 24.1*  PROCALCITON -- -- -- -- --   Cultures: NA Antibiotics: NA  A:  Steroid induced leukocytosis. History of splenectomy, afebrile P:   CT no abscess No abx  ENDOCRINE  Lab 09/17/11 0731 09/16/11 2125 09/16/11 1715 09/16/11 0759 09/15/11 1716  GLUCAP 89 95 85 96 98   A:  Presum  Rel AI, cbg wnl P:    decadron to continue cbg added, dc ssi florinef  NEUROLOGIC  Brain MRI:  5/30 >>> Left para pharyngeal mass is compatible with carcinoma. There is invasion of the skull base and suggestion of mild progression since the recent MRI. There is a left mastoid sinus effusion due to obstruction of the eustachian tube. Correlation with biopsy is suggested.  A:  Syncope, likely secondary to bradycardia / hypotension.  Nasopharyngeal carcinoma with skull base invasion.  Followed at [facility].  First radiation treatment on  6/11.  Chemotherapy planned at [facility]  P:   Decadron as ordered Oxycodone PRN pain  BEST PRACTICE / DISPOSITION Level of Care:  ICU >> change to SDU status 6/16 Primary Service:  PCCM Consultants: Cards, med onc, rad onc Code Status:  Full Diet:  Reg 6/14 DVT Px:  Heparin drip > start coumadin 6/16--> dc 6/18 GI Px:  Protonix Skin Integrity:  Intact Social / Family:  Updated pt  Mcarthur Rossetti. Tyson Alias, MD, FACP Pgr: [phone] [facility]

## 2011-09-17 NOTE — Progress Notes (Signed)
eLink Physician-Brief Progress Note Patient Name: Shane Marsh DOB: [birth year] MRN: [id number]  Date of Service  09/17/2011   HPI/Events of Note   hypophosphatemia  eICU Interventions  Phos replaced   Intervention Category Intermediate Interventions: Electrolyte abnormality - evaluation and management  Latausha Flamm 09/17/2011, 4:49 AM

## 2011-09-18 LAB — CBC
MCHC: 36.6 g/dL — ABNORMAL HIGH (ref 30.0–36.0)
RDW: 13 % (ref 11.5–15.5)

## 2011-09-18 LAB — PROTIME-INR
INR: 1.58 — ABNORMAL HIGH (ref 0.00–1.49)
Prothrombin Time: 19.2 seconds — ABNORMAL HIGH (ref 11.6–15.2)

## 2011-09-18 LAB — PHOSPHORUS: Phosphorus: 2.6 mg/dL (ref 2.3–4.6)

## 2011-09-18 LAB — BASIC METABOLIC PANEL
Calcium: 8 mg/dL — ABNORMAL LOW (ref 8.4–10.5)
GFR calc non Af Amer: >90 mL/min (ref >90)
Glucose, Bld: 115 mg/dL — ABNORMAL HIGH (ref 70–99)
Sodium: 128 mEq/L — ABNORMAL LOW (ref 135–145)

## 2011-09-18 LAB — HEPARIN LEVEL (UNFRACTIONATED): Heparin Unfractionated: 0.57 IU/mL (ref 0.30–0.70)

## 2011-09-18 LAB — MAGNESIUM: Magnesium: 2 mg/dL (ref 1.5–2.5)

## 2011-09-18 LAB — GLUCOSE, CAPILLARY

## 2011-09-18 NOTE — Progress Notes (Signed)
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  Events Since Admission: 6/12  Admitted for bradycardia associated with dyspnea, hypotension and syncope 6/12- remains on pressors 6/13- remains on dopa 6/14- transferred to [facility] and first chemo given 6/18- vaso added, dop off successful  Current Status:  6/18- vaso added, dop off successful  Vital Signs: Temp:  [98.4 F (36.9 C)-98.5 F (36.9 C)] 98.5 F (36.9 C) (06/18 2200) Pulse Rate:  [36-109] 50  (06/19 0700) Resp:  [14-19] 16  (06/19 0700) BP: (100-132)/(59-79) 130/69 mmHg (06/19 0700) SpO2:  [90 %-99 %] 94 % (06/19 0700)  Intake/Output Summary (Last 24 hours) at 09/18/11 0814 Last data filed at 09/18/11 0600  Gross per 24 hour  Intake 2184.9 ml  Output    620 ml  Net 1564.9 ml   Physical Examination: General: no distress Neuro: Alert and oriented, patch to left eye HEENT:  Oropharynx edema left tonsillar unchanged Neck:  Supple, unchanged Cardiovascular:  Sinus bradycardia on monitor while asleep with good MAP, no murmurs s1 s2  Lungs:  CTA to bases Abdomen:  Soft, nontender, bowel sounds present Musculoskeletal:  NO edema, lower extremities Skin:  Intact  Active Problems:  Syncope  Nasopharyngeal carcinoma  Bradycardia  Hypotension  DVT (deep venous thrombosis)  Coagulopathy  ASSESSMENT AND PLAN  PULMONARY  Lab 09/12/11 1234  PHART 7.603*  PCO2ART 21.0*  PO2ART 126.0*  HCO3 20.8  O2SAT 99.0   Ventilator Settings:   CXR:  6/11 >>> NAD VQ:  5/30 >>> Low probability for PE CT angio 6/12>> negative for PE ETT:  NA  A:  Episodic dyspnea associated with bradycardia / hypotension.  Recent  VQ scan negative for PE and now CT.  Appropriately anticoagulated for DVT. P:   Supplemental oxygen Push IS as in bed To chair and increase mobility  CARDIOVASCULAR  Lab 09/11/11 2200 09/11/11 1420  TROPONINI <0.30 <0.30  LATICACIDVEN -- --  PROBNP -- --   ECG:  6/11 >>> Sinus bradycardia, no ST-T changes TTE:  5/30 >>> EF 55-60%, no wall motion abnormalities, normal valves Carotid Doppler:  5/30 >>> No significant ICA stenosis LE venous Doppler:  5/30 >>> Findings consistent with acute deep vein thrombosis involving the popliteal and peroneal veins of both the right and left lower extremity. ECHO 6/12>>> EF 65%, RV normal Lines: R IJ 6/12>>> CT neck 6/12: Large left posterior nasopharyngeal mass consistent with carcinoma. No significant progression compared with prior PET scan.  A: Symptomatic bradycardia associated with hypotension / syncope, suspect related to increased vagal tone related to skull base tumor invasion / carotid bulb?Marland Kitchen Meets criteria AI P:  Telemetry Dopamine gtt,off Trying to "sensitize" receptors on vascular beds with vaso, successful? Will reduce to 0.02 today and goal to off by am  Decadron remain same dose Continue florinef Chemo x 5 days completed Only restart dop for symptomatic brady cvp accuracy in ?, dc cvp done  If / when off vaso still brady - can we reconsider pacer? Given tie needed for chemo to help?  RENAL  Lab 09/18/11 0540 09/17/11 0330 09/16/11 1840 09/16/11 0328 09/15/11 0500 09/12/11 0446  NA 128* 134* 135 134* 136 --  K 4.1 4.0 -- -- -- --  CL 98 104 105 105 104 --  CO2 23 23 22 25 24  --  BUN 22 17 18 18 17  --  CREATININE 0.63 0.61 0.54 0.70 0.58 --  CALCIUM 8.0* 7.9* 7.6* 8.0* 8.0* --  MG 2.0 2.2 -- -- -- 2.2  PHOS 2.6 1.9* -- -- -- --   Intake/Output      06/18 0701 - 06/19 0700 06/19 0701 - 06/20 0700   I.V. (mL/kg) 1194.4 (15.2)    IV Piggyback 1161    Total Intake(mL/kg) 2355.4 (30)    Urine (mL/kg/hr) 620 (0.3)     Total Output 620    Net +1735.4         Urine Occurrence 3 x    Stool Occurrence 2 x     Foley:  NA urine na 137 = iatrogenic A:  R/o SIADH P:   Maintenance IVF kvo cvp accuracy?  dc doubt csw, salt wasting nephropathy Had pos balance, Na drop again, follow trend, he is neg 2 lit at [facility] If Na drops in am, will dose lasix x 1, had pos balance [facility] Will discuss current chemo SE with heme, salt wasting etc Favor SIADH, inapprop concentration noted, repeat urine osm - lasix likely will be needed  GASTROINTESTINAL  Lab 09/14/11 0437  AST 35  ALT 108*  ALKPHOS 50  BILITOT 1.1  PROT 5.1*  ALBUMIN 2.5*    A:  Prior N / vom, tolerated diet P:   diet  HEMATOLOGIC  Lab 09/18/11 0540 09/17/11 0330 09/16/11 0328 09/15/11 0611 09/15/11 0500 09/14/11 0437  HGB 13.3 13.9 14.2 -- 14.6 14.2  HCT 36.3* 39.2 39.5 -- 40.8 39.2  PLT 163 184 226 -- 273 286  INR 1.58* 1.48 1.09 1.24 -- 1.70*  APTT -- -- -- -- -- --   A:  DVT P:  Heparin gtt remain, can shut off and place catheter via IR once more hemodynamically stable, when off pressors x 24 hrs Likely can place thur/frid No coumadin planned as outpt - dc'ed 6/18  INFECTIOUS  Lab 09/18/11 0540 09/17/11 0330 09/16/11 0328 09/15/11 0500 09/14/11 0437  WBC 9.9 13.8* 13.6* 15.9* 19.4*  PROCALCITON -- -- -- -- --   Cultures: NA Antibiotics: NA  A:  Steroid induced leukocytosis. History of splenectomy, afebrile P:   Cbc in future, not daily unless needed by heme s/p chemo  ENDOCRINE  Lab 09/17/11 2239 09/17/11 1629 09/17/11 1134 09/17/11 0731 09/16/11 2125  GLUCAP 150* 158* 182* 89 95   A:  Presum  Rel AI, cbg wnl P:    decadron to continue Florinef Controlled on own  NEUROLOGIC  Brain MRI:  5/30 >>> Left para pharyngeal mass is compatible with carcinoma. There is invasion of the skull base and suggestion of mild progression since the recent MRI. There is a left mastoid sinus effusion due to obstruction of the  eustachian tube. Correlation with biopsy is suggested.  A:  Syncope, likely secondary to bradycardia / hypotension.  Nasopharyngeal carcinoma with skull base invasion.  Followed at [facility].  First radiation treatment on 6/11.  Chemotherapy planned at [facility]  P:   Decadron as ordered Oxycodone PRN pain  BEST PRACTICE / DISPOSITION Level of Care:  ICU Primary Service:  PCCM Consultants: Cards, med onc, rad onc Code Status:  Full Diet:  Reg 6/14 DVT Px:  Heparin drip > start coumadin 6/16--> dc 6/18 GI Px:  Protonix Skin Integrity:  Intact Social / Family:  Updated pt  Shane Marsh.  Titus Mould, MD, [place] Pgr: [facility]

## 2011-09-18 NOTE — Progress Notes (Signed)
SUBJECTIVE:  Feels well. No drops in heart rate below 60, even while off of dopamine.  Now weaning vasopressin.  No lightheadedness or syncope.  OBJECTIVE:   Vitals:   Filed Vitals:   09/18/11 0700 09/18/11 0800 09/18/11 0835 09/18/11 1030  BP: 130/69 125/72 106/65 120/71  Pulse: 50 51 59 70  Temp:      TempSrc:      Resp: 16 18 22 18   Height:      Weight:      SpO2: 94% 94% 99% 99%   I&O's:   Intake/Output Summary (Last 24 hours) at 09/18/11 1333 Last data filed at 09/18/11 1100  Gross per 24 hour  Intake   2068 ml  Output   1175 ml  Net    893 ml   TELEMETRY: Reviewed telemetry pt in NSR:     PHYSICAL EXAM General: Well developed, well nourished, in no acute distress Head:   Normal cephalic and atramatic  Lungs:  No wheezing Heart:   HRRR S1 S2 Abdomen: Bowel sounds are positive, abdomen soft and non-tender without masses or                  Hernia's noted. Msk:  Back normal, normal gait. Normal strength and tone for age. Extremities:   No  edema.   Neuro: Alert and oriented X 3. Psych:  Normal affect, responds appropriately   LABS: Basic Metabolic Panel:  Basename 09/18/11 0540 09/17/11 0330  NA 128* 134*  K 4.1 4.0  CL 98 104  CO2 23 23  GLUCOSE 115* 97  BUN 22 17  CREATININE 0.63 0.61  CALCIUM 8.0* 7.9*  MG 2.0 2.2  PHOS 2.6 1.9*   Liver Function Tests: No results found for this basename: AST:2,ALT:2,ALKPHOS:2,BILITOT:2,PROT:2,ALBUMIN:2 in the last 72 hours No results found for this basename: LIPASE:2,AMYLASE:2 in the last 72 hours CBC:  Basename 09/18/11 0540 09/17/11 0330  WBC 9.9 13.8*  NEUTROABS -- --  HGB 13.3 13.9  HCT 36.3* 39.2  MCV 86.6 87.7  PLT 163 184   Cardiac Enzymes: No results found for this basename: CKTOTAL:3,CKMB:3,CKMBINDEX:3,TROPONINI:3 in the last 72 hours BNP: No components found with this basename: POCBNP:3 D-Dimer: No results found for this basename: DDIMER:2 in the last 72 hours Hemoglobin A1C: No results found  for this basename: HGBA1C in the last 72 hours Fasting Lipid Panel: No results found for this basename: CHOL,HDL,LDLCALC,TRIG,CHOLHDL,LDLDIRECT in the last 72 hours Thyroid Function Tests: No results found for this basename: TSH,T4TOTAL,FREET3,T3FREE,THYROIDAB in the last 72 hours Anemia Panel: No results found for this basename: VITAMINB12,FOLATE,FERRITIN,TIBC,IRON,RETICCTPCT in the last 72 hours Coag Panel:   Lab Results  Component Value Date   INR 1.58* 09/18/2011   INR 1.48 09/17/2011   INR 1.09 09/16/2011    RADIOLOGY: Dg Orthopantogram  08/30/2011  *RADIOLOGY REPORT*  Clinical Data: No evaluation prior to radiation therapy.  ORTHOPANTOGRAM/PANORAMIC  Comparison: None.  Findings: Teeth numbers one and 16 are absent.  No active pathology evident relating to the maxillary dentition.  Teeth numbers 17, 18 and 32 are absent.  There is some periodontal disease related to tooth number 31.  No other significant finding.  IMPRESSION: Missing teeth as above.  Some periodontal disease tooth number 31.  Original Report Authenticated By: Thomasenia Sales, M.D.   Ct Soft Tissue Neck W Contrast  09/11/2011  *RADIOLOGY REPORT*  Clinical Data: Nasopharyngeal carcinoma.  CT NECK WITH CONTRAST  Technique:  Multidetector CT imaging of the neck was performed with intravenous contrast.  Contrast: OMNIPAQUE IOHEXOL 350 MG/ML SOLN  Comparison: PET scan 09/04/2011.  MR head 08/29/2011.  Findings: Large left posterior nasopharyngeal mass with extension across the midline, into the skull base, and laterally extending to the parapharyngeal and carotid space.  Exact measurement of the mass is difficult due to its pleomorphic nature.  At the level of the foramen magnum, cross-sectional measurements are 41 x 51 mm. At the level of C2, where the mass extends more laterally, the mass measures 24 x 84 mm.  Craniocaudal extent is approximately 55 mm. There is fullness in the left cavernous sinus which on previous MR  represents intracranial extension.  There is slight fullness enhancement of the dura on the left at the foramen magnum level also suggesting intraspinal/intracranial extension.  Clear-cut osseous destruction of the skull base is not seen although MR demonstrates skull base involvement.  The cavernous sinus extension may be via the foramen of Lacerum alongside the carotid artery.  Lateral extension of mass extends to the left parotid space deep lobe; (image 39 series 13) the mass is also contiguous with the pterygoids on the left.  There are surgical clips in the neck just above the thyroid gland on the left.  This may represent previous biopsy.  Bilateral hypermetabolic levels III and left level II lymph nodes as described on PET scan are redemonstrated. It is possible that the large parapharyngeal mass on the left at the level of C2 represents a conglomerate mass of lymph nodes which is adjacent to the primary lesion.  The airway is not significantly compromised by this lesion.  The paranasal sinuses are clear.  Left mastoid effusion represents eustachian tube dysfunction.  Left internal carotid artery is surrounded by the tumor and narrowed.  (Image 40 series 13). Compared with prior PET scan and a most recent MR, the appearance is similar.  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 No significant progression compared with prior PET scan.  Original Report Authenticated By: Elsie Stain, M.D.   Ct Angio Chest W/cm &/or Wo Cm  09/11/2011  *RADIOLOGY REPORT*  Clinical Data: Bilateral DVT.  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  Syncopal episodes at home.Symptomatic bradycardia.  CT ANGIOGRAPHY CHEST  Technique:  Multidetector CT imaging of the chest using the standard protocol during bolus administration of intravenous contrast. Multiplanar reconstructed images including MIPs were obtained and reviewed to evaluate the vascular anatomy.   Contrast: OMNIPAQUE IOHEXOL 350 MG/ML SOLN  Comparison: None.  Findings: The heart is normal.  No enlargement, coronary calcification, or pericardial effusion.  No hilar or mediastinal lymphadenopathy.  Small right effusion with slight right lower lobe subsegmental atelectasis. No pulmonary nodules are seen.  Good opacification of the pulmonary vasculature.  No evidence for pulmonary embolic disease.  Unremarkable aorta and visualized great vessels.  No upper abdominal pathology.  No acute osseous findings.  IMPRESSION: No evidence for pulmonary embolic disease.  Small right effusion and slight subsegmental atelectasis.  Original Report Authenticated By: Elsie Stain, M.D.   Mri Brain Without Contrast  08/29/2011  *RADIOLOGY REPORT*  Clinical Data: Back pain.  History of tonsillar carcinoma.  MRI HEAD WITHOUT CONTRAST  Technique:  Multiplanar, multiecho pulse sequences of the brain and surrounding structures were obtained according to standard protocol without intravenous contrast.  Comparison: MRI 08/05/2011  Findings: Negative for acute infarct.  Several old small white matter hyperintensities in the frontal white matter are unchanged most likely due to chronic microvascular ischemia.  Mild  atrophy is present.  Ventricle size is normal.  Infiltrating mass lesion in the left neck is again identified. This is  surrounding the left internal carotid artery and extending posterior to the cervical carotid artery.  Mass extends to the para pharyngeal soft tissues and infiltrates the longus coli muscle and extends into the clivus.  There is thickening of the dura suggesting some epidural tumor.  There is also extension into the occipital bone on the left at the foramen magnum.  There may be some progression of tumor in the skull base since the prior MRI. The primary mass measures 3.9 x 3.0 cm and may have progressed slightly in the interval.  There is a mastoid sinus effusion on the left due to obstruction of  the eustachian tube.  IMPRESSION: Negative for acute infarct.  Left para pharyngeal mass is compatible with carcinoma.  There is invasion of the  skull base and suggestion of mild progression since the recent MRI.  There is a left mastoid sinus effusion due to obstruction of the eustachian tube.  Correlation with biopsy is suggested.  Original Report Authenticated By: Camelia Phenes, M.D.   Nm Pulmonary Perfusion  08/29/2011  *RADIOLOGY REPORT*  Clinical Data:  Shortness of breath and syncope.  Nasopharyngeal carcinoma.  NUCLEAR MEDICINE PERFUSION LUNG SCAN  Technique:  Perfusion images were obtained in multiple projections after intravenous injection of radiopharmaceutical.  Radiopharmaceutical: CURIE MAA TECHNETIUM TO 73M ALBUMIN AGGREGATED  mCi Tc-46m MAA.  Comparison:  plain film of 08/28/2011.  Findings: No wedge-shaped perfusion defects to suggest pulmonary embolism.  IMPRESSION: Very low probability for pulmonary embolism.  Original Report Authenticated By: Consuello Bossier, M.D.   Nm Pet Image Initial (pi) Skull Base To Thigh  09/04/2011  *RADIOLOGY REPORT*  Clinical Data: Initial treatment strategy for nasopharyngeal carcinoma.  NUCLEAR MEDICINE PET SKULL BASE TO THIGH  Fasting Blood Glucose:  96  Technique:  17 mCi F-18 FDG was injected intravenously. CT data was obtained and used for attenuation correction and anatomic localization only.  (This was not acquired as a diagnostic CT examination.) Additional exam technical data entered on technologist worksheet.  Comparison:  08/29/2011  Findings:  Neck: Again identified is a large left parapharyngeal mass which is intensely hypermetabolic compatible with carcinoma.  This measures 2.5 x 6.6 by 6.6 cm and invades the skull base.  The SUV max associated this mass is equal to that 16.4.  Bilateral hypermetabolic cervical adenopathy is identified.  Index left level III lymph node measures 0.1 cm and has an SUV max equal to the 6.4, image 39.  Right  level III node measures 1.5 cm and has an SUV max equal to 6.1.  Chest:  No hypermetabolic mediastinal or hilar nodes.  No suspicious pulmonary nodules on the CT scan.  Abdomen/Pelvis:  No abnormal hypermetabolic activity within the liver, pancreas, adrenal glands, or spleen.  No hypermetabolic lymph nodes in the abdomen or pelvis.  There is a soft tissue attenuating structure within the left upper quadrant of the abdomen is identified measuring 4.1 x 5.4 cm.  Likely splenule.  Advanced calcified atherosclerotic disease affects the abdominal aorta and its branches.  The left common iliac artery measures 2.3 cm in diameter.  [place]:  No focal hypermetabolic activity to suggest skeletal metastasis.  IMPRESSION:  1.  Large left-sided parapharyngeal mass invading the skull base consistent with carcinoma. 2.  Bilateral cervical lymph node hypermetabolic adenopathy.  Original Report Authenticated By: Rosealee Albee, M.D.   Dg  Chest Port 1 View  09/15/2011  *RADIOLOGY REPORT*  Clinical Data: Evaluate for pulmonary edema  PORTABLE CHEST - 1 VIEW  Comparison: 09/14/2011; 09/11/2011; 08/28/2011 chest CT - 09/11/2011  Findings: Grossly unchanged cardiac silhouette and mediastinal contours.  Stable position of support apparatus.  There is persistent mild elevation of the right hemidiaphragm.  No new focal airspace opacities.  No definite pleural effusion or pneumothorax. Unchanged bones.  Surgical clips overlie the left supraclavicular fossa.  IMPRESSION: Stable examination without definite evidence of pulmonary edema.  Original Report Authenticated By: Waynard Reeds, M.D.   Dg Chest Port 1 View  09/14/2011  *RADIOLOGY REPORT*  Clinical Data: Evaluate pulmonary edema, history of nasal and throat cancer  PORTABLE CHEST - 1 VIEW  Comparison: 09/11/2011; 09/10/18 13; chest CT - 09/11/2011  Findings: Grossly unchanged cardiac silhouette and mediastinal contours.  Stable positioning of support apparatus.  Minimal bibasilar  opacities favored to represent atelectasis.  No definite pleural effusion or pneumothorax.   Surgical clips overlie the left supraclavicular fossa.  Unchanged bones.  IMPRESSION: Minimal bibasilar opacities favored to represent atelectasis.  No definite evidence of pulmonary edema.  Original Report Authenticated By: Waynard Reeds, M.D.   Dg Chest Port 1 View  09/11/2011  *RADIOLOGY REPORT*  Clinical Data: Line placement.  PORTABLE CHEST - 1 VIEW  Comparison: CT chest earlier this same day and plain film chest 09/10/2011.  Findings: New right IJ catheter is in place with the tip in the lower superior vena cava.  No pneumothorax.  Lungs are clear. Heart size is normal.  Surgical clips base of the neck on the left noted.  IMPRESSION: IJ catheter in good position.  No pneumothorax or other acute finding.  Original Report Authenticated By: Bernadene Bell. Maricela Curet, M.D.   Dg Chest Port 1 View  09/11/2011  *RADIOLOGY REPORT*  Clinical Data: Shortness of breath.  Recent diagnosis of nasal pharyngeal carcinoma.  PORTABLE CHEST - 1 VIEW  Comparison: 08/28/2011 and PET of 09/04/2011.  Findings: Surgical clips at the left side of the thoracic inlet. Midline trachea.  Normal heart size.  No pleural effusion or pneumothorax.  Clear lungs.  IMPRESSION: No acute cardiopulmonary disease.  Original Report Authenticated By: Consuello Bossier, M.D.   Dg Chest Portable 1 View  08/28/2011  *RADIOLOGY REPORT*  Clinical Data: Hypotension.  PORTABLE CHEST - 1 VIEW  Comparison: Chest 08/06/2011.  Findings: Lungs are clear.  Heart size is normal.  No pneumothorax or pleural fluid.  Surgical clips base of the left neck noted.  IMPRESSION: No acute finding.  Original Report Authenticated By: Bernadene Bell. D'ALESSIO, M.D.   Dg Swallowing Func-no Report  08/29/2011  CLINICAL DATA: dysphagia   FLUOROSCOPY FOR SWALLOWING FUNCTION STUDY:  Fluoroscopy was provided for swallowing function study, which was  administered by a speech pathologist.   Final results and recommendations  from this study are contained within the speech pathology report.        ASSESSMENT: Vagal reaction from neck mass; more vasodepressor than cardioinhibitory.  PLAN:  Spoke with Dr. Anne Fu.  The case was discussed with EP several days ago.  They recommended considering midodrine 5-10 mg twice to three times a day if hypotension remained a problem.  Pacemaker would not be helpful to prevent the low BP.  Hopefully, vagal stimulation will reduce as tumor size decreases from treatment.  Currrently hemodynamically stable.  Rhythm stable.  COntinue vasopressin wean.   Corky Crafts., MD  09/18/2011  1:33 PM

## 2011-09-18 NOTE — Progress Notes (Signed)
ANTICOAGULATION CONSULT NOTE - Follow Up Consult  Pharmacy Consult for heparin Indication: DVT  Allergies  Allergen Reactions  . Morphine And Related Nausea And Vomiting    Patient Measurements: Height: 5\' 8"  (172.7 cm) Weight: 173 lb 4.5 oz (78.6 kg) IBW/kg (Calculated) : 68.4  Heparin Dosing Weight:   Vital Signs: Temp: 98.5 F (36.9 C) (06/18 2200) Temp src: Oral (06/18 1600) BP: 110/59 mmHg (06/19 0000) Pulse Rate: 52  (06/19 0000)  Labs:  Basename 09/17/11 2225 09/17/11 1440 09/17/11 0330 09/16/11 1840 09/16/11 0328 09/15/11 0611 09/15/11 0500  HGB -- -- 13.9 -- 14.2 -- --  HCT -- -- 39.2 -- 39.5 -- 40.8  PLT -- -- 184 -- 226 -- 273  APTT -- -- -- -- -- -- --  LABPROT -- -- 18.2* -- 14.3 15.9* --  INR -- -- 1.48 -- 1.09 1.24 --  HEPARINUNFRC 0.42 0.15* 0.18* -- -- -- --  CREATININE -- -- 0.61 0.54 0.70 -- --  CKTOTAL -- -- -- -- -- -- --  CKMB -- -- -- -- -- -- --  TROPONINI -- -- -- -- -- -- --    Estimated Creatinine Clearance: 96.2 ml/min (by C-G formula based on Cr of 0.61).   Medications:  Infusions:    . sodium chloride 20 mL/hr at 09/17/11 1013  . heparin 500 Units/hr (09/16/11 1900)  . vasopressin (PITRESSIN) infusion - *FOR SHOCK* 0.03 Units/min (09/17/11 1700)  . DISCONTD: DOPamine 2 mcg/kg/min (09/17/11 0900)    Assessment: Patient with heparin level at goal.  No issues with drip per RN.  Goal of Therapy:  Heparin level 0.3-0.7 units/ml Monitor platelets by anticoagulation protocol: Yes   Plan:  No changes, will follow up with am labs  Aleene Davidson Crowford 09/18/2011,1:44 AM

## 2011-09-18 NOTE — Progress Notes (Signed)
ANTICOAGULATION CONSULT NOTE - Follow Up Consult  Pharmacy Consult for Heparin/Coumadin Indication: History of DVT  Allergies  Allergen Reactions  . Morphine And Related Nausea And Vomiting    Patient Measurements: Height: 5\' 8"  (172.7 cm) Weight: 173 lb 4.5 oz (78.6 kg) IBW/kg (Calculated) : 68.4  Heparin Dosing Weight: 80.4kg.  Vital Signs: Temp: 98.5 F (36.9 C) (06/18 2200) BP: 126/76 mmHg (06/19 0600) Pulse Rate: 51  (06/19 0600)  Labs:  Basename 09/18/11 0540 09/17/11 2225 09/17/11 1440 09/17/11 0330 09/16/11 1840 09/16/11 0328  HGB 13.3 -- -- 13.9 -- --  HCT 36.3* -- -- 39.2 -- 39.5  PLT 163 -- -- 184 -- 226  APTT -- -- -- -- -- --  LABPROT 19.2* -- -- 18.2* -- 14.3  INR 1.58* -- -- 1.48 -- 1.09  HEPARINUNFRC 0.57 0.42 0.15* -- -- --  CREATININE 0.63 -- -- 0.61 0.54 --  CKTOTAL -- -- -- -- -- --  CKMB -- -- -- -- -- --  TROPONINI -- -- -- -- -- --    Estimated Creatinine Clearance: 96.2 ml/min (by C-G formula based on Cr of 0.63).   Medications:  Scheduled:     . dexamethasone  4 mg Intravenous Q6H  . dexamethasone  4 mg Intravenous Q6H  . feeding supplement  1 Container Oral TID BM  . fludrocortisone  0.1 mg Oral Daily  . FLUOROURACIL (ADRUCIL) CHEMO infusion For Inpatient Use  750 mg/m2/day (Treatment Plan Actual) Intravenous Q24H  . pantoprazole  40 mg Oral QHS  . sodium phosphate  Dextrose 5% IVPB  30 mmol Intravenous Once  . DISCONTD: warfarin  5 mg Oral ONCE-1800  . DISCONTD: Warfarin - Pharmacist Dosing Inpatient   Does not apply q1800   Infusions:     . sodium chloride 20 mL/hr at 09/17/11 1013  . heparin 750 Units/hr (09/18/11 0600)  . vasopressin (PITRESSIN) infusion - *FOR SHOCK* 0.03 Units/min (09/18/11 0600)  . 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   Coumadin 5mg  daily PTA (INR therapeutic on admission). Was initially held on admission, resumed 6/16, but canceled 6/18.  Oncology prefers Lovenox over Coumadin therapy due to expected thrombocytopenia from chemo  Heparin level therapeutic this morning  CBC wnl, Platelets are wnl but decreasing.  No bleeding/complications reported.   Goal of Therapy:  Heparin level 0.3-0.7 units/ml Monitor platelets by anticoagulation protocol: Yes   Plan:   Continue heparin 750 units/hr  Daily heparin level, CBC Note: Oncology prefers Lovenox over Coumadin therapy due to expected thrombocytopenia from chemo.  F/U plan orders since planning [facility] placement.   Lynann Beaver PharmD, BCPS Pager [phone] 09/18/2011 7:18 AM

## 2011-09-18 NOTE — Consult Note (Signed)
[facility] INPATIENT PROGRESS NOTE  Name: Shane Marsh      MRN: [id number]    Location: 1223/1223-01  Date: 09/18/2011 Time:2:55 PM   Subjective: Interval History:Shane Marsh reported better rest yesterday.  He feels generalized fatigue.  His left orbital head ache has improved.  He still has diplopia.  He has been able to ambulate to bathroom without problem.   Objective: Vital signs in last 24 hours: Temp:  [98.5 F (36.9 C)] 98.5 F (36.9 C) (06/18 2200) Pulse Rate:  [36-81] 65  (06/19 1300) Resp:  [14-22] 18  (06/19 1300) BP: (100-132)/(59-81) 122/81 mmHg (06/19 1300) SpO2:  [90 %-100 %] 100 % (06/19 1300)      PHYSICAL EXAM:  General: well-nourished man in no acute distress. Eyes: no scleral icterus. ENT: There were no oropharyngeal lesions on my unaided exam. Neck was without thyromegaly. There was fullness in the left cervical neck but no discreet mass. Lymphatics: Negative cervical, supraclavicular or axillary adenopathy. Respiratory: lungs were clear bilaterally without wheezing or crackles. Cardiovascular: Regular rate and rhythm, S1/S2, without murmur, rub or gallop. There was no pedal edema. GI: abdomen was soft, flat, nontender, nondistended, without organomegaly. Muscoloskeletal: no spinal tenderness of palpation of vertebral spine. Skin exam was without echymosis, petichae.  His tongue and uvula are not as deviated as last week.    Studies/Results: reviewed.    MEDICATIONS: I have reviewed the patient's current medications.     Assessment/Plan:    1. 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  - He is s/p Cisplatin/Taxotere bolus on Friday 09/10/11. He has been on 24hour CI of 5FU (days 1 through 5) due to finish late tonight.  He has  tolerated chemo well with mild fatigue.  - He has had mild nausea/vomiting as expected from chemo. He has compazine/Zofran prn.  There is no reason to check daily CBC and CMET from onc standpoint.  But if he is patient, consider these lab every 3 days.     2. 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 Once out patient, I will convert Dexamethasone IV to PO. He is off of Dopamine; and being weaned off of Vasopressin.  If persistent bradycardia, please consider EP eval for pacemaker in the RIGHT CHEST to avoid field of radiation in the left neck/left chest.   3. Bilateral DVT's (dx in 07/2011): On heparin IV gtt protocol. I will convert this to Lovenox upon discharge. With chemotherapy, he will have thrombocytopenia. Thus, I prefer Lovenox over Coumadin.   4. Access: I placed a consult for IR for placement of portacath during this admission when his is more stable. Future cycles of chemo will be given out patient. Right now, he is receiving chemo via IJ catheter which will be pulled upon discharge from this current admission.   5.  General deconditioning:  Due to prolonged hospital course, consider PT/OT when out of the ICU.    I'm away from 09/19/11 to 09/29/11.  If there is question regarding chemo or its side effects, please contact Dr. Eli Hose.  If there is question regarding the overall plan for his cancer, please contact Dr. Lonie Peak (both at [phone]).

## 2011-09-19 LAB — HEPARIN LEVEL (UNFRACTIONATED): Heparin Unfractionated: 0.51 IU/mL (ref 0.30–0.70)

## 2011-09-19 LAB — BASIC METABOLIC PANEL
Calcium: 8.5 mg/dL (ref 8.4–10.5)
Glucose, Bld: 97 mg/dL (ref 70–99)
Potassium: 4.5 mEq/L (ref 3.5–5.1)
Sodium: 133 mEq/L — ABNORMAL LOW (ref 135–145)

## 2011-09-19 LAB — CBC
HCT: 38.9 % — ABNORMAL LOW (ref 39.0–52.0)
RDW: 13 % (ref 11.5–15.5)
WBC: 6.2 10*3/uL (ref 4.0–10.5)

## 2011-09-19 LAB — PROTIME-INR: INR: 1.29 (ref 0.00–1.49)

## 2011-09-19 MED ORDER — DEXAMETHASONE SODIUM PHOSPHATE 4 MG/ML IJ SOLN
4.0000 mg | Freq: Three times a day (TID) | INTRAMUSCULAR | Status: DC
  Administered 2011-09-19 – 2011-09-20 (×3): 4 mg via INTRAVENOUS
  Filled 2011-09-19 (×5): qty 1

## 2011-09-19 MED ORDER — MIDODRINE HCL 5 MG PO TABS
5.0000 mg | ORAL_TABLET | Freq: Three times a day (TID) | ORAL | Status: DC
  Administered 2011-09-19 – 2011-09-20 (×4): 5 mg via ORAL
  Filled 2011-09-19 (×7): qty 1

## 2011-09-19 MED ORDER — CEFAZOLIN SODIUM 1-5 GM-% IV SOLN
1.0000 g | INTRAVENOUS | Status: AC
  Administered 2011-09-20: 1 g via INTRAVENOUS

## 2011-09-19 NOTE — Progress Notes (Signed)
Subjective:  Improved. Off vasopressor/ dopamine. No SOB, no CP. Walked around unit today. Did well.   Objective:  Vital Signs in the last 24 hours: Temp:  [98.5 F (36.9 C)-98.8 F (37.1 C)] 98.8 F (37.1 C) (06/19 2000) Pulse Rate:  [51-103] 69  (06/20 0100) Resp:  [15-23] 17  (06/20 0100) BP: (104-128)/(70-81) 104/79 mmHg (06/20 0100) SpO2:  [93 %-100 %] 94 % (06/20 0100)  Intake/Output from previous day: 06/19 0701 - 06/20 0700 In: 2165 [P.O.:720; I.V.:929; IV Piggyback:516] Out: 2277 [Urine:2275; Stool:2]   Physical Exam: General: Well developed, well nourished, in no acute distress. Head:  Normocephalic and atraumatic. RIJ Lungs: Clear to auscultation and percussion. Heart: Tachy S1 and S2.  No murmur, rubs or gallops.  Abdomen: soft, non-tender, positive bowel sounds. Extremities: No clubbing or cyanosis. No edema. Neurologic: Alert and oriented x 3.    Lab Results:  Basename 09/19/11 0315 09/18/11 0540  WBC 6.2 9.9  HGB 14.3 13.3  PLT 153 163    Basename 09/19/11 0315 09/18/11 0540  NA 133* 128*  K 4.5 4.1  CL 99 98  CO2 28 23  GLUCOSE 97 115*  BUN 20 22  CREATININE 0.81 0.63   No results found for this basename: TROPONINI:2,CK,MB:2 in the last 72 hours Telemetry: Reviewed. No brady. Personally viewed.    Assessment/Plan:  Active Problems:  Syncope  Nasopharyngeal carcinoma  Bradycardia  Hypotension  DVT (deep venous thrombosis)  Coagulopathy  -Improved. BP stable.   - Will start midodrine 5mg  PO TID to help with maintaining BP.  - Reviewed case with Dr. Lewayne Bunting of EP.   - Pacemaker would not be helpful secondary to vasodepressor response.  - Spoke to patient and wife at length.   - Continuing florinef.    Will monitor.    Shane Marsh 09/19/2011, 8:42 AM

## 2011-09-19 NOTE — H&P (Signed)
Chief Complaint: Oropharyngeal cancer Referring Physician:Ha HPI: Shane Marsh is an 61 y.o. male with recurrent OP cancer with extension to (L)carotid sinus causing recurrent syncope and bradycardia, as well as some intracranial invasion. He developed hypotension and bradycardia as well as bilateral DVTs and has spent the last several days in ICU. He has stabilized nicely and now moved to the floor. He is to restart chemotherapy and request for [facility] placement is made to IR team. PCCM team and cardiology have now determined pt stable for procedure. He currently has a (R)IJ central line which will need to be removed and there was also concern that he would need a pacer, but cardiology has determined that unnecessary. He looks and feels good at present time.  Past Medical History:  Past Medical History  Diagnosis Date  . Hypertension   . Cancer     nasal and throat    Past Surgical History:  Past Surgical History  Procedure Date  . Splectomy   . Tonsillectomy     one side    Family History: No family history on file.  Social History:  reports that he has never smoked. He does not have any smokeless tobacco history on file. He reports that he does not drink alcohol. His drug history not on file.  Allergies:  Allergies  Allergen Reactions  . Morphine And Related Nausea And Vomiting    Medications: Medications Prior to Admission  Medication Sig Dispense Refill  . dexamethasone (DECADRON) 4 MG tablet Take 4 mg by mouth 2 (two) times daily.       . diphenhydrAMINE (BENADRYL) 25 MG tablet Take 25-50 mg by mouth daily.      Marland Kitchen HYDROcodone-acetaminophen (NORCO) 7.5-325 MG per tablet Take 1 tablet by mouth every 6 (six) hours as needed. For pain      . warfarin (COUMADIN) 5 MG tablet Take 5 mg by mouth daily.        Please HPI for pertinent positives, otherwise complete 10 system ROS negative.  Physical Exam: Blood pressure 109/70, pulse 81, temperature 98.2 F (36.8 C), temperature  source Oral, resp. rate 18, height 5\' 8"  (1.727 m), weight 162 lb 11.2 oz (73.8 kg), SpO2 98.00%. Body mass index is 24.74 kg/(m^2).   General Appearance:  Alert, cooperative, no distress, appears stated age  Head:  Normocephalic, without obvious abnormality, atraumatic  ENT: Unremarkable  Neck: Supple, symmetrical, trachea midline, (R)IJ TL line intact.  Lungs:   Clear to auscultation bilaterally, no w/r/r, respirations unlabored without use of accessory muscles.  Chest Wall:  No tenderness or deformity.   Heart:  Regular rate and rhythm, S1, S2 normal, no murmur, rub or gallop. Carotids 2+ without bruit.  Abdomen:   Soft, non-tender, non distended. Bowel sounds active all four quadrants,  no masses, no organomegaly.  Extremities: Extremities normal, atraumatic, no cyanosis or edema  Neurologic: Normal affect, no gross deficits.   PROTIME-INR     Status: Abnormal   Collection Time   09/19/11  3:15 AM      Component Value Range Comment   Prothrombin Time 16.3 (*) 11.6 - 15.2 seconds    INR 1.29  0.00 - 1.49   CBC     Status: Abnormal   Collection Time   09/19/11  3:15 AM      Component Value Range Comment   WBC 6.2  4.0 - 10.5 K/uL    RBC 4.50  4.22 - 5.81 MIL/uL    Hemoglobin 14.3  13.0 - 17.0  g/dL    HCT 16.1 (*) 09.6 - 52.0 %    MCV 86.4  78.0 - 100.0 fL    MCH 31.8  26.0 - 34.0 pg    MCHC 36.8 (*) 30.0 - 36.0 g/dL    RDW 04.5  40.9 - 81.1 %    Platelets 153  150 - 400 K/uL   HEPARIN LEVEL (UNFRACTIONATED)     Status: Normal   Collection Time   09/19/11  3:15 AM      Component Value Range Comment   Heparin Unfractionated 0.51  0.30 - 0.70 IU/mL   BASIC METABOLIC PANEL     Status: Abnormal   Collection Time   09/19/11  3:15 AM      Component Value Range Comment   Sodium 133 (*) 135 - 145 mEq/L    Potassium 4.5  3.5 - 5.1 mEq/L    Chloride 99  96 - 112 mEq/L    CO2 28  19 - 32 mEq/L    Glucose, Bld 97  70 - 99 mg/dL    BUN 20  6 - 23 mg/dL    Creatinine, Ser 9.14  0.50 -  1.35 mg/dL    Calcium 8.5  8.4 - 78.2 mg/dL    GFR calc non Af Amer >90  >90 mL/min    GFR calc Af Amer >90  >90 mL/min    No results found.  Assessment/Plan Recurrent oropharyngeal cancer Bilat DVTs on heparin gtt Needs port for chemotherapy. Discussed procedure of port placement with pt including risks and complications. Labs ok. Will have heparin held at about 0700 tomorrow for procedure about 1100. Consent signed in chart.  Brayton El PA-C 09/19/2011, 4:00 PM

## 2011-09-19 NOTE — Progress Notes (Signed)
Name: Shane Marsh MRN: [id number] DOB: [birth year]    LOS: 9  Referring Provider:  EDP Reason for Referral:  Bradycardia / syncope   PULMONARY / CRITICAL CARE MEDICINE  Brief patient description:  61 y/o with nasopharyngeal carcinoma invading skull base and bilateral LE DVT on Coumadin admitted for bradycardia associated with dyspnea, hypotension and syncope.  Events Since Admission: 6/12  Admitted for bradycardia associated with dyspnea, hypotension and syncope 6/12- remains on pressors 6/13- remains on dopa 6/14- transferred to [facility] and first chemo given 6/18- vaso added, dop off successful  Current Status:  6/20- off vaso / dop, walking th e ICU!  Vital Signs: Temp:  [98.5 F (36.9 C)-98.8 F (37.1 C)] 98.8 F (37.1 C) (06/19 2000) Pulse Rate:  [51-103] 69  (06/20 0100) Resp:  [15-23] 17  (06/20 0100) BP: (104-128)/(65-81) 104/79 mmHg (06/20 0100) SpO2:  [93 %-100 %] 94 % (06/20 0100)  Intake/Output Summary (Last 24 hours) at 09/19/11 4098 Last data filed at 09/19/11 0600  Gross per 24 hour  Intake 2108.5 ml  Output   2277 ml  Net -168.5 ml   Physical Examination: General: no distress, ambulating Neuro: Alert and oriented, patch to left eye, spirits increased HEENT:  Left tonsilar area improved, less edema appearing Neck:  Supple, unchanged Cardiovascular:  S1 s2 RRR Lungs:  CTA to bases Abdomen:  Soft, nontender, bowel sounds present Musculoskeletal:  NO edema, lower extremities Skin:  Intact  Active Problems:  Syncope  Nasopharyngeal carcinoma  Bradycardia  Hypotension  DVT (deep venous thrombosis)  Coagulopathy  ASSESSMENT AND PLAN  PULMONARY  Lab 09/12/11 1234  PHART 7.603*  PCO2ART 21.0*  PO2ART 126.0*  HCO3 20.8  O2SAT 99.0   Ventilator Settings:   CXR:  6/11 >>> NAD VQ:  5/30 >>> Low probability for PE CT angio 6/12>> negative for PE ETT:  NA  A:  Episodic dyspnea associated with bradycardia / hypotension.  Recent VQ scan negative for  PE and now CT.  Appropriately anticoagulated for DVT. P:   Supplemental oxygen Ambulating well in icu for first time  CARDIOVASCULAR No results found for this basename: TROPONINI:5,LATICACIDVEN:5, O2SATVEN:5,PROBNP:5 in the last 168 hours ECG:  6/11 >>> Sinus bradycardia, no ST-T changes TTE:  5/30 >>> EF 55-60%, no wall motion abnormalities, normal valves Carotid Doppler:  5/30 >>> No significant ICA stenosis LE venous Doppler:  5/30 >>> Findings consistent with acute deep vein thrombosis involving the popliteal and peroneal veins of both the right and left lower extremity. ECHO 6/12>>> EF 65%, RV normal Lines: R IJ 6/12>>> CT neck 6/12: Large left posterior nasopharyngeal mass consistent with carcinoma. No significant progression compared with prior PET scan.  A: Symptomatic bradycardia associated with hypotension / syncope, suspect related to increased vagal tone related to skull base tumor invasion / carotid bulb?Marland Kitchen Meets criteria AI Off vaso/dop ambulating 6/20 P:  Tele bed in am if remains off pressors Agree with midodrine, added If has symptoms again, would consider q12h sq vasopressin? EP = no role pacer Want to dc line neck in am , will need hep held For porta cath Florinef maintain as outpt  RENAL  Lab 09/19/11 0315 09/18/11 0540 09/17/11 0330 09/16/11 1840 09/16/11 0328  NA 133* 128* 134* 135 134*  K 4.5 4.1 -- -- --  CL 99 98 104 105 105  CO2 28 23 23 22 25   BUN 20 22 17 18 18   CREATININE 0.81 0.63 0.61 0.54 0.70  CALCIUM 8.5 8.0* 7.9* 7.6* 8.0*  MG -- 2.0 2.2 -- --  PHOS -- 2.6 1.9* -- --   Intake/Output      06/19 0701 - 06/20 0700 06/20 0701 - 06/21 0700   P.O. 720    I.V. (mL/kg) 929 (11.8)    IV Piggyback 516    Total Intake(mL/kg) 2165 (27.5)    Urine (mL/kg/hr) 2275 (1.2)    Stool 2    Total Output 2277    Net -112          Foley:  NA urine na 137 = iatrogenic A:  R/o SIADH P:   Allow neg balance on own, Na rising NO lasix bmet in am    GASTROINTESTINAL  Lab 09/14/11 0437  AST 35  ALT 108*  ALKPHOS 50  BILITOT 1.1  PROT 5.1*  ALBUMIN 2.5*    A:  Prior N / vom, tolerated diet P:   diet  HEMATOLOGIC  Lab 09/19/11 0315 09/18/11 0540 09/17/11 0330 09/16/11 0328 09/15/11 0611 09/15/11 0500  HGB 14.3 13.3 13.9 14.2 -- 14.6  HCT 38.9* 36.3* 39.2 39.5 -- 40.8  PLT 153 163 184 226 -- 273  INR 1.29 1.58* 1.48 1.09 1.24 --  APTT -- -- -- -- -- --   A:  DVT P:  Heparin gtt remain, can shut off and place catheter via IR once more hemodynamically stable, calling ir for today or in am  Then d c line neck No coumadin planned as outpt - dc'ed 6/18  INFECTIOUS  Lab 09/19/11 0315 09/18/11 0540 09/17/11 0330 09/16/11 0328 09/15/11 0500  WBC 6.2 9.9 13.8* 13.6* 15.9*  PROCALCITON -- -- -- -- --   Cultures: NA Antibiotics: NA  A:  Steroid induced leukocytosis. History of splenectomy, afebrile P:   Line dc neck ASAP  ENDOCRINE  Lab 09/18/11 0828 09/17/11 2239 09/17/11 1629 09/17/11 1134 09/17/11 0731  GLUCAP 110* 150* 158* 182* 89   A:  Presum  Rel AI, cbg wnl P:   decadron to continue to q8h then attempt oral pills in am  Florinef cbg Controlled on own  NEUROLOGIC  Brain MRI:  5/30 >>> Left para pharyngeal mass is compatible with carcinoma. There is invasion of the skull base and suggestion of mild progression since the recent MRI. There is a left mastoid sinus effusion due to obstruction of the eustachian tube. Correlation with biopsy is suggested.  A:  Syncope, likely secondary to bradycardia / hypotension.  Nasopharyngeal carcinoma with skull base invasion.  Followed at [facility].  First radiation treatment on 6/11.  Chemotherapy planned at [facility]  P:   Decadron to q8h Oxycodone PRN pain  BEST PRACTICE / DISPOSITION Level of Care:  ICU tyo sdu status Primary Service:  PCCM Consultants: Cards, med onc, rad onc Code Status:  Full Diet:  Reg 6/14 DVT Px:  Heparin drip > start coumadin 6/16--> dc  6/18 GI Px:  Protonix Skin Integrity:  Intact Social / Family:  Updated pt  Mcarthur Rossetti. Tyson Alias, MD, FACP Pgr: [phone] [facility]

## 2011-09-19 NOTE — Progress Notes (Signed)
ANTICOAGULATION CONSULT NOTE - Follow Up Consult  Pharmacy Consult for Heparin Indication: History of DVT  Allergies  Allergen Reactions  . Morphine And Related Nausea And Vomiting    Patient Measurements: Height: 5\' 8"  (172.7 cm) Weight: 173 lb 4.5 oz (78.6 kg) IBW/kg (Calculated) : 68.4  Heparin Dosing Weight: 80.4kg  Vital Signs: Temp: 98.8 F (37.1 C) (06/19 2000) Temp src: Oral (06/19 2000) BP: 104/79 mmHg (06/20 0100) Pulse Rate: 69  (06/20 0100)  Labs:  Basename 09/19/11 0315 09/18/11 0540 09/17/11 2225 09/17/11 0330  HGB 14.3 13.3 -- --  HCT 38.9* 36.3* -- 39.2  PLT 153 163 -- 184  APTT -- -- -- --  LABPROT 16.3* 19.2* -- 18.2*  INR 1.29 1.58* -- 1.48  HEPARINUNFRC 0.51 0.57 0.42 --  CREATININE 0.81 0.63 -- 0.61  CKTOTAL -- -- -- --  CKMB -- -- -- --  TROPONINI -- -- -- --    Estimated Creatinine Clearance: 95 ml/min (by C-G formula based on Cr of 0.81).   Medications:  Scheduled:     . dexamethasone  4 mg Intravenous Q6H  . feeding supplement  1 Container Oral TID BM  . fludrocortisone  0.1 mg Oral Daily  . FLUOROURACIL (ADRUCIL) CHEMO infusion For Inpatient Use  750 mg/m2/day (Treatment Plan Actual) Intravenous Q24H  . pantoprazole  40 mg Oral QHS   Infusions:     . sodium chloride 500 mL (09/18/11 0500)  . heparin 750 Units/hr (09/18/11 2040)  . 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   Coumadin 5mg  daily PTA (INR therapeutic on admission). Was initially held on admission, resumed 6/16, but canceled 6/18.  Oncology prefers Lovenox over Coumadin therapy due to expected thrombocytopenia from chemo  Heparin level therapeutic this morning  CBC wnl, Platelets are wnl but slowly decreasing.  No  bleeding/complications reported.   Goal of Therapy:  Heparin level 0.3-0.7 units/ml Monitor platelets by anticoagulation protocol: Yes   Plan:   Continue heparin IV infusion 750 units/hr  Daily heparin level, CBC Note: Oncology prefers Lovenox over Coumadin therapy due to expected thrombocytopenia from chemo.  F/U orders for planned [facility] placement.   Lynann Beaver PharmD, BCPS Pager [phone] 09/19/2011 7:43 AM

## 2011-09-19 NOTE — H&P (Signed)
Agree 

## 2011-09-20 ENCOUNTER — Inpatient Hospital Stay (HOSPITAL_COMMUNITY): Payer: 59

## 2011-09-20 LAB — CBC
HCT: 38.5 % — ABNORMAL LOW (ref 39.0–52.0)
Hemoglobin: 13.8 g/dL (ref 13.0–17.0)
MCH: 31.4 pg (ref 26.0–34.0)
MCHC: 35.8 g/dL (ref 30.0–36.0)
MCV: 87.5 fL (ref 78.0–100.0)
RBC: 4.4 MIL/uL (ref 4.22–5.81)

## 2011-09-20 LAB — BASIC METABOLIC PANEL
BUN: 24 mg/dL — ABNORMAL HIGH (ref 6–23)
CO2: 26 mEq/L (ref 19–32)
Calcium: 8.5 mg/dL (ref 8.4–10.5)
Chloride: 103 mEq/L (ref 96–112)
Creatinine, Ser: 0.87 mg/dL (ref 0.50–1.35)
Glucose, Bld: 94 mg/dL (ref 70–99)

## 2011-09-20 MED ORDER — FENTANYL CITRATE 0.05 MG/ML IJ SOLN
INTRAMUSCULAR | Status: AC
  Filled 2011-09-20: qty 4

## 2011-09-20 MED ORDER — MIDODRINE HCL 5 MG PO TABS: 5.0000 mg | ORAL_TABLET | Freq: Three times a day (TID) | ORAL | Status: AC

## 2011-09-20 MED ORDER — HEPARIN SOD (PORK) LOCK FLUSH 100 UNIT/ML IV SOLN
500.0000 [IU] | Freq: Once | INTRAVENOUS | Status: AC
  Administered 2011-09-20: 500 [IU] via INTRAVENOUS
  Filled 2011-09-20: qty 5

## 2011-09-20 MED ORDER — DEXAMETHASONE 4 MG PO TABS: 4.0000 mg | ORAL_TABLET | Freq: Three times a day (TID) | ORAL | Status: DC

## 2011-09-20 MED ORDER — ENOXAPARIN SODIUM 80 MG/0.8ML ~~LOC~~ SOLN: 80.0000 mg | Freq: Two times a day (BID) | SUBCUTANEOUS | Status: DC

## 2011-09-20 MED ORDER — MIDAZOLAM HCL 2 MG/2ML IJ SOLN
INTRAMUSCULAR | Status: AC
  Filled 2011-09-20: qty 6

## 2011-09-20 MED ORDER — FLUDROCORTISONE ACETATE 0.1 MG PO TABS: 0.1000 mg | ORAL_TABLET | Freq: Every day | ORAL | Status: DC

## 2011-09-20 MED ORDER — ENOXAPARIN SODIUM 80 MG/0.8ML ~~LOC~~ SOLN
80.0000 mg | Freq: Two times a day (BID) | SUBCUTANEOUS | Status: DC
  Administered 2011-09-20: 80 mg via SUBCUTANEOUS
  Filled 2011-09-20 (×2): qty 0.8

## 2011-09-20 MED ORDER — MIDAZOLAM HCL 5 MG/5ML IJ SOLN
INTRAMUSCULAR | Status: AC | PRN
  Administered 2011-09-20: 1 mg via INTRAVENOUS

## 2011-09-20 MED ORDER — FENTANYL CITRATE 0.05 MG/ML IJ SOLN
INTRAMUSCULAR | Status: AC | PRN
  Administered 2011-09-20: 50 ug via INTRAVENOUS

## 2011-09-20 MED ORDER — ALPRAZOLAM 0.25 MG PO TABS: 0.2500 mg | ORAL_TABLET | Freq: Two times a day (BID) | ORAL | Status: AC | PRN

## 2011-09-20 NOTE — Progress Notes (Signed)
Subjective:  Feeling well. No CP, no SOB, port put in yesterday.     Objective:  Vital Signs in the last 24 hours: Temp:  [97.6 F (36.4 C)-99.3 F (37.4 C)] 97.8 F (36.6 C) (06/21 0542) Pulse Rate:  [66-111] 71  (06/21 0542) Resp:  [18-19] 19  (06/21 0542) BP: (98-120)/(67-73) 119/73 mmHg (06/21 0542) SpO2:  [97 %-98 %] 97 % (06/21 0542) Weight:  [73.8 kg (162 lb 11.2 oz)] 73.8 kg (162 lb 11.2 oz) (06/20 1505)  Intake/Output from previous day: 06/20 0701 - 06/21 0700 In: 467.5 [I.V.:467.5] Out: -    Physical Exam: General: Well developed, well nourished, in no acute distress. Head:  Normocephalic and atraumatic. RIJ in place Lungs: Clear to auscultation and percussion. Heart: Normal S1 and S2.  No murmur, rubs or gallops.  Abdomen: soft, non-tender, positive bowel sounds. Extremities: No clubbing or cyanosis. No edema. Neurologic: Alert and oriented x 3.    Lab Results:  Basename 09/20/11 0443 09/19/11 0315  WBC 2.4* 6.2  HGB 13.8 14.3  PLT 134* 153    Basename 09/20/11 0443 09/19/11 0315  NA 136 133*  K 3.7 4.5  CL 103 99  CO2 26 28  GLUCOSE 94 97  BUN 24* 20  CREATININE 0.87 0.81   Telemetry: NSR, no brady Personally viewed.   Assessment/Plan:  Active Problems:  Syncope  Nasopharyngeal carcinoma  Bradycardia  Hypotension  DVT (deep venous thrombosis)  Coagulopathy   -Midodrine (alpha agonist) started yesterday. Hopefully this will help with vaso-depressor response. Please maintain adequate hydration, salt intake.  -No further significant bradycardia noted. He may certainly have further episodes of vasovagal near syncope/syncope secondary to his nasopharyngeal carcinoma invading his carotid bulb. I have instructed him that if he feels the prodrome, to lay down, elevate his legs.  -Once again, no indication for pacemaker.  DVT-on heparin drip, INR is subtherapeutic. Per primary team.  Anne Fu, Loraine Leriche 09/20/2011, 10:11 AM

## 2011-09-20 NOTE — Progress Notes (Signed)
  Pharmacy Note (Brief) - Heparin and Warfain  Pt currently off floor for procedure. IV heparin reportedly held at 7am. Awaiting post-procedure plans/order (resume heparin or change to Lovenox?, d/c warfarin?)  Note: Oncology prefers Lovenox over Coumadin therapy due to expected thrombocytopenia from chemo.   Plan: 1) Will not further dose warfarin or IV heparin until post-procedure plans clarified.  Darrol Angel, PharmD Pager: [phone] 09/20/2011 11:02 AM

## 2011-09-20 NOTE — Procedures (Signed)
Procedure:  Right IJ portacath Access:  Right IJ vein SL PAC placed with tip at cavoatrial junction.  Accessed and ready for use.

## 2011-09-20 NOTE — Discharge Summary (Signed)
Physician Discharge Summary  Patient ID: Shane Marsh MRN: [id number] DOB/AGE: September 11, 1950 61 y.o.  Admit date: 09/10/2011 Discharge date: 09/20/2011    Discharge Diagnoses:  Active Problems:  Syncope  Nasopharyngeal carcinoma  Bradycardia  Hypotension  DVT (deep venous thrombosis)  Coagulopathy    Brief Summary: Will Heinkel is a 61 y.o. 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  He was  d/c 5/31 after admission which revealed bilat LE DVT for which he was placed on coumadin.  He returned 6/11 with several days syncope at home with one episode ?loss of pulse.  He was hypotensive, bradycardic and PCCM admitted to ICU.  Bradycardia, hypotension and syncope thought to be r/t increased vagal tone in setting increasing skull tumor invasion.  He was seen in consultation by Dr. Gaylyn Rong of medical oncology and Dr. Basilio Cairo with radiation oncology and started on induction chemotherapy this admit.  His steroids have been increased, florinef and midodrine added.    As per Dr. Lodema Pilot recommendations he will be d/c on therapeutic lovenox for DVT rx rather than coumadin given risk for thrombocytopenia with chemo.    Syncope/ Symptomatic bradycardia / Hypotension -- Suspect r/t increased vagal tone in setting increasing skull base tumor invasion and carotid bulb involvement.  Remained on pressors x 6 days before being successfully weaning.  Started on midodrine with some improvement BP.  Seen in consultation by cardiology, no indication for pacemaker in this setting.  Much education done with pt if he feels predrome to lie flat and elevate his legs.  Have cont decadron and added florinef per oncology.  See below.   Dyspnea - episodic dyspnea likely associated with bradycardia and hypotension.  Resolved quickly.  R/o PE as recent VQ scan neg PE, CT this admit neg PE and pt anticoag for DVT.  Off all O2, ambulating without difficulty, no underlying pulmonary issues  identified.   BLE DVT -- identified in May.  Previously on coumadin but per Dr. Lodema Pilot recs will d/c on therapeutic lovenox given risk of thrombocytopenia with chemo.   Nasopharyngeal carcinoma -- Chemo started this admit with Dr. Gaylyn Rong.  Pt will f/u to cont chemo cycles with cancer center as outpt. Dr. Gaylyn Rong will refer back to rad onc Dr. Basilio Cairo when indicated. Cont florinef/decadron.  Port-a-cath placed by IR 6/21 prior to d/c for use with chemo.    Leukocytosis -- steroids induced.  Hx splenectomy.  No s/s infectious process.     Discharge Labs  BMET  Lab 09/20/11 0443 09/19/11 0315 09/18/11 0540 09/17/11 0330 09/16/11 1840  NA 136 133* 128* 134* 135  K 3.7 4.5 -- -- --  CL 103 99 98 104 105  CO2 26 28 23 23 22   GLUCOSE 94 97 115* 97 106*  BUN 24* 20 22 17 18   CREATININE 0.87 0.81 0.63 0.61 0.54  CALCIUM 8.5 8.5 8.0* 7.9* 7.6*  MG -- -- 2.0 2.2 --  PHOS -- -- 2.6 1.9* --     CBC   Lab 09/20/11 0443 09/19/11 0315 09/18/11 0540  HGB 13.8 14.3 13.3  HCT 38.5* 38.9* 36.3*  WBC 2.4* 6.2 9.9  PLT 134* 153 163   Anti-Coagulation  Lab 09/20/11 0443 09/19/11 0315 09/18/11 0540 09/17/11 0330 09/16/11 0328  INR 1.14 1.29 1.58* 1.48 1.09      Discharge Orders    Future Orders Please Complete By Expires   TREATMENT CONDITIONS      Comments:   Notify the  MD for the following lab values: ANC < 1500, PLT < 100K, Hemoglobin < 8.5, Creatinine > 1.5, urine output < 200 ml prior to cisplatin.  If labs are abnormal OR no lab data is available, MD must be notified and order obtained to begin chemotherapy.       Follow-up Information    Follow up with Buckner Malta, MD. (Dr. Gaylyn Rong will refer you back to Dr. Basilio Cairo when ready )    Contact information:   [address] [place] [address] [phone]       Follow up with Jethro Bolus, MD. (Office will call you with appt  -- If you do not hear from them by Monday 6/24 call [phone])    Contact information:   [address] [place] [address] [phone]       Follow up with [name], MD on 09/30/2011. (With [facility] -- 9:30am )    Contact information:   [address] [place] [address] [phone]           Medication List  As of 09/20/2011  1:09 PM   START taking these medications         ALPRAZolam 0.25 MG tablet   Commonly known as: XANAX   Take 1 tablet (0.25 mg total) by mouth 2 (two) times daily as needed for anxiety.      fludrocortisone 0.1 MG tablet   Commonly known as: FLORINEF   Take 1 tablet (0.1 mg total) by mouth daily.      midodrine 5 MG tablet   Commonly known as: PROAMATINE   Take 1 tablet (5 mg total) by mouth 3 (three) times daily with meals.      ondansetron 8 MG tablet   Commonly known as: ZOFRAN   Take 1 tablet (8 mg total) by mouth 2 (two) times daily as needed (Nausea or vomiting).      prochlorperazine 10 MG tablet   Commonly known as: COMPAZINE   Take 1 tablet (10 mg total) by mouth every 6 (six) hours as needed (Nausea or vomiting).         CHANGE how you take these medications         dexamethasone 4 MG tablet   Commonly known as: DECADRON   Take 1 tablet (4 mg total) by mouth 3 (three) times daily.   What changed: how often to take the med         CONTINUE taking these medications         diphenhydrAMINE 25 MG tablet   Commonly known as: BENADRYL      HYDROcodone-acetaminophen 7.5-325 MG per tablet   Commonly known as: NORCO         STOP taking these medications         warfarin 5 MG tablet          Where to get your medications    These are the prescriptions that you need to pick up. We sent them to a specific pharmacy, so you will need to go there to get them.   [id number] - [place], [place] - [address]    [address] [place] [address]    Phone: [phone]        fludrocortisone 0.1 MG tablet   midodrine 5 MG tablet   ondansetron 8 MG tablet    prochlorperazine 10 MG tablet         You may  get these medications from any pharmacy.         ALPRAZolam 0.25 MG tablet         Information on where to get these meds is not yet available. Ask your nurse or doctor.         dexamethasone 4 MG tablet              Disposition: 01-Home or Self Care  Discharged Condition: Leaman Abe has met maximum benefit of inpatient care and is medically stable and cleared for discharge.  Patient is pending follow up as above.   He is ambulatory on RA   Time spent on disposition:  Greater than 35 minutes.   SignedDanford Bad, NP 09/20/2011  1:09 PM Pager: [phone]  *Care during the described time interval was provided by me and/or other providers on the critical care team. I have reviewed this patient's available data, including medical history, events of note, physical examination and test results as part of my evaluation.  Pt independently  seen and examined and available cxr's and labs reviewed and I agree with above findings/ imp/ plan. Warned pt he may develop swelling from florinef and needed to keep f/u appts to monitor  Sandrea Hughs, MD Pulmonary and Critical Care Medicine [facility] Cell [phone]

## 2011-09-20 NOTE — ED Notes (Signed)
Incision made

## 2011-09-20 NOTE — Progress Notes (Signed)
ANTICOAGULATION CONSULT NOTE  Pharmacy Consult for Lovenox Indication: History of DVT  Allergies  Allergen Reactions  . Morphine And Related Nausea And Vomiting    Patient Measurements: Height: 5\' 8"  (172.7 cm) Weight: 162 lb 11.2 oz (73.8 kg) IBW/kg (Calculated) : 68.4  Heparin Dosing Weight: 80.4kg  Vital Signs: Temp: 97.8 F (36.6 C) (06/21 1300) Temp src: Oral (06/21 1300) BP: 113/73 mmHg (06/21 1300) Pulse Rate: 61  (06/21 1300)  Labs:  Basename 09/20/11 0443 09/19/11 0315 09/18/11 0540  HGB 13.8 14.3 --  HCT 38.5* 38.9* 36.3*  PLT 134* 153 163  APTT -- -- --  LABPROT 14.8 16.3* 19.2*  INR 1.14 1.29 1.58*  HEPARINUNFRC 0.39 0.51 0.57  CREATININE 0.87 0.81 0.63  CKTOTAL -- -- --  CKMB -- -- --  TROPONINI -- -- --    Estimated Creatinine Clearance: 88.4 ml/min (by C-G formula based on Cr of 0.87).   Medications:  Scheduled:     .  ceFAZolin (ANCEF) IV  1 g Intravenous 60 min Pre-Op  . dexamethasone  4 mg Intravenous Q8H  . enoxaparin (LOVENOX) injection  80 mg Subcutaneous Q12H  . feeding supplement  1 Container Oral TID BM  . fentaNYL      . fludrocortisone  0.1 mg Oral Daily  . midazolam      . midodrine  5 mg Oral TID WC  . pantoprazole  40 mg Oral QHS   Infusions:     . sodium chloride 500 mL (09/18/11 0500)  . DISCONTD: heparin 750 Units/hr (09/18/11 2040)    Assessment:  36 YOM with nasopharyngeal carcinoma s/p radation 6/11 admitted with bradycardia, dyspnea, hypotension, and new syncope on Coumadin for bilateral DVT (5/30) prior to admission.   Coumadin 5mg  daily PTA (INR therapeutic on admission). Was initially held on admission, resumed 6/16, but canceled 6/18.  Oncology prefers Lovenox over Coumadin therapy due to expected thrombocytopenia from chemo  Now changing to Lovenox 80mg  sq q12h per NP orders.  Pt is being discharged now, will send up Lovenox 80mg  SQ x1 to give prior to discharge  Prescription for Lovenox 80mg  q12h has  been called into the patient's outpatient pharmacy by the NP   Goal of Therapy:  Monitor platelets by anticoagulation protocol: Yes   Plan:  Lovenox 80mg  q12h No further warfarin  Darrol Angel, PharmD Pager: [phone] 09/20/2011 1:18 PM

## 2011-09-22 ENCOUNTER — Other Ambulatory Visit: Payer: Self-pay | Admitting: Oncology

## 2011-09-22 DIAGNOSIS — C119 Malignant neoplasm of nasopharynx, unspecified: Secondary | ICD-10-CM

## 2011-09-23 ENCOUNTER — Other Ambulatory Visit: Payer: Self-pay | Admitting: Oncology

## 2011-09-23 ENCOUNTER — Telehealth: Payer: Self-pay | Admitting: *Deleted

## 2011-09-23 DIAGNOSIS — C119 Malignant neoplasm of nasopharynx, unspecified: Secondary | ICD-10-CM

## 2011-09-23 NOTE — Telephone Encounter (Signed)
Per staff message I have scheduled appts. JMW  

## 2011-09-24 ENCOUNTER — Telehealth: Payer: Self-pay | Admitting: Oncology

## 2011-09-24 NOTE — Progress Notes (Signed)
Received call from patient's wife stating that patient has small areas of 'red bumps' on chest and upper arms; no itching at the sites, no fever or distress; patient has increased fatigue, decreased appetite, but wife states that he is eating applesauce, pudding, cereal and drinking Ensure as much as possible; advised to call office if rash spreads; verified understanding.

## 2011-09-24 NOTE — Telephone Encounter (Signed)
called pts home s/w wife and provided appt for 07/02.

## 2011-09-25 ENCOUNTER — Encounter: Payer: Self-pay | Admitting: Hematology and Oncology

## 2011-09-29 ENCOUNTER — Encounter (HOSPITAL_COMMUNITY): Payer: Self-pay | Admitting: Emergency Medicine

## 2011-09-29 ENCOUNTER — Emergency Department (HOSPITAL_COMMUNITY)
Admission: EM | Admit: 2011-09-29 | Discharge: 2011-09-29 | Disposition: A | Discharge status: Home / Self Care | Payer: 59 | Attending: Emergency Medicine | Admitting: Emergency Medicine

## 2011-09-29 DIAGNOSIS — D72829 Elevated white blood cell count, unspecified: Secondary | ICD-10-CM | POA: Insufficient documentation

## 2011-09-29 DIAGNOSIS — T82898A Other specified complication of vascular prosthetic devices, implants and grafts, initial encounter: Secondary | ICD-10-CM | POA: Insufficient documentation

## 2011-09-29 DIAGNOSIS — D689 Coagulation defect, unspecified: Secondary | ICD-10-CM

## 2011-09-29 DIAGNOSIS — Y849 Medical procedure, unspecified as the cause of abnormal reaction of the patient, or of later complication, without mention of misadventure at the time of the procedure: Secondary | ICD-10-CM | POA: Insufficient documentation

## 2011-09-29 LAB — CBC
Hemoglobin: 14.4 g/dL (ref 13.0–17.0)
MCH: 31 pg (ref 26.0–34.0)
Platelets: 221 10*3/uL (ref 150–400)
RBC: 4.65 MIL/uL (ref 4.22–5.81)
WBC: 24.7 10*3/uL — ABNORMAL HIGH (ref 4.0–10.5)

## 2011-09-29 NOTE — ED Provider Notes (Signed)
History     CSN: [id number]  Arrival date & time 09/29/11  4098   First MD Initiated Contact with Patient 09/29/11 [phone]      Chief Complaint  Patient presents with  . Coagulation Disorder  . Wound Check    (Consider location/radiation/quality/duration/timing/severity/associated sxs/prior treatment) HPI Complains of bleeding from puncture site for central line had been removed from his right neck 2 days ago. Bleeding onset 4 AM today. Occurred spontaneously. Patient denies other symptoms no shortness of breath no dysphagia no dysphonia no pain no swelling of neck, no weakness. No treatment prior to coming here. Patient currently on Lovenox injections report bilateral arm DVTs. Past Medical History  Diagnosis Date  . Hypertension   . Cancer     nasal and throat    Past Surgical History  Procedure Date  . Splectomy   . Tonsillectomy     one side    No family history on file.  History  Substance Use Topics  . Smoking status: Never Smoker   . Smokeless tobacco: Not on file  . Alcohol Use: No      Review of Systems  Constitutional: Negative.   HENT: Negative.   Respiratory: Negative.   Cardiovascular: Negative.   Gastrointestinal: Negative.   Musculoskeletal: Negative.   Skin: Negative.   Neurological: Negative.   Hematological: Bruises/bleeds easily.  Psychiatric/Behavioral: Negative.     Allergies  Morphine and related  Home Medications   Current Outpatient Rx  Name Route Sig Dispense Refill  . ALPRAZOLAM 0.25 MG PO TABS Oral Take 1 tablet (0.25 mg total) by mouth 2 (two) times daily as needed for anxiety. 30 tablet 0  . DEXAMETHASONE 4 MG PO TABS Oral Take 1 tablet (4 mg total) by mouth 3 (three) times daily.    Marland Kitchen DIPHENHYDRAMINE HCL 25 MG PO TABS Oral Take 25-50 mg by mouth daily.    Marland Kitchen ENOXAPARIN SODIUM 80 MG/0.8ML [place] SOLN Subcutaneous Inject 0.8 mLs (80 mg total) into the skin every 12 (twelve) hours. 0 Syringe   . FLUDROCORTISONE ACETATE 0.1 MG PO  TABS Oral Take 1 tablet (0.1 mg total) by mouth daily. 30 tablet 0  . HYDROCODONE-ACETAMINOPHEN 7.5-325 MG PO TABS Oral Take 1 tablet by mouth every 6 (six) hours as needed. For pain    . MIDODRINE HCL 5 MG PO TABS Oral Take 1 tablet (5 mg total) by mouth 3 (three) times daily with meals. 90 tablet 0  . ONDANSETRON HCL 8 MG PO TABS Oral Take 1 tablet (8 mg total) by mouth 2 (two) times daily as needed (Nausea or vomiting). 30 tablet 1  . PROCHLORPERAZINE MALEATE 10 MG PO TABS Oral Take 1 tablet (10 mg total) by mouth every 6 (six) hours as needed (Nausea or vomiting). 30 tablet 1    BP 137/94  Pulse 65  Temp 98 F (36.7 C) (Oral)  Resp 16  SpO2 99%  Physical Exam  Nursing note and vitals reviewed. Constitutional: He appears well-developed and well-nourished.  HENT:  Head: Normocephalic and atraumatic.  Eyes: Conjunctivae are normal. Pupils are equal, round, and reactive to light.  Neck: Neck supple. No tracheal deviation present. No thyromegaly present.       Is a 2 mm puncture site at right neck oozing blood slowly, venous, no hematoma no tenderness  Cardiovascular: Normal rate and regular rhythm.   No murmur heard. Pulmonary/Chest: Effort normal and breath sounds normal.       Port-A-Cath in place right chest  Abdominal:  Soft. Bowel sounds are normal. He exhibits no distension. There is no tenderness.  Musculoskeletal: Normal range of motion. He exhibits no edema and no tenderness.  Neurological: He is alert. Coordination normal.  Skin: Skin is warm and dry. No rash noted.  Psychiatric: He has a normal mood and affect.    ED Course  Procedures (including critical care time) 7:23 AM bleeding stopped after placing direct pressure on site with a fingertip. Bandage applied. Patient remains asymptomatic.  Labs Reviewed  CBC   No results found.   No diagnosis found.    MDM  Strongly doubt infection as cause of leukocytosis. No fever and patient is asymptomatic.  Leukocytosis most likely secondary to steroids and de- margination. plan. Spoke with Dr. Marko Stai to withhold this morning's dose of Lovenox. He has an appointment with his primary care Dr. tomorrow. And followup with oncology in 2 days. Return if condition worsens for any reason. Diagnosis coagulopathy due to anticoagulants        Doug Sou, MD 09/29/11 [phone]

## 2011-09-29 NOTE — ED Notes (Signed)
Pt alert, arrives from home, c/o bleeding from central line insertion site, onset this am, pt is on blood thinner, dsd to right neck, bloody drainage noted, line removed last Friday, resp even unlabored, skin pwd

## 2011-09-29 NOTE — Discharge Instructions (Signed)
If bleeding recurs placed directly or with the fingertip over the area for 5 minutes. If you can't get the bleeding to stop or if you feel worse for any reason return to the emergency department. Keep your appointment with Dr. Christell Constant tomorrow and with Dr.Ha in 2 days

## 2011-10-01 ENCOUNTER — Other Ambulatory Visit: Payer: Self-pay | Admitting: Lab

## 2011-10-01 ENCOUNTER — Other Ambulatory Visit (HOSPITAL_BASED_OUTPATIENT_CLINIC_OR_DEPARTMENT_OTHER): Payer: 59 | Admitting: Lab

## 2011-10-01 ENCOUNTER — Inpatient Hospital Stay: Payer: Self-pay | Admitting: Oncology

## 2011-10-01 ENCOUNTER — Ambulatory Visit (HOSPITAL_BASED_OUTPATIENT_CLINIC_OR_DEPARTMENT_OTHER): Payer: 59 | Admitting: Oncology

## 2011-10-01 ENCOUNTER — Telehealth: Payer: Self-pay | Admitting: Oncology

## 2011-10-01 ENCOUNTER — Encounter: Payer: Self-pay | Admitting: Oncology

## 2011-10-01 VITALS — BP 127/89 | HR 66 | Temp 97.1°F | Ht 68.0 in | Wt 152.2 lb

## 2011-10-01 DIAGNOSIS — I82409 Acute embolism and thrombosis of unspecified deep veins of unspecified lower extremity: Secondary | ICD-10-CM

## 2011-10-01 DIAGNOSIS — C119 Malignant neoplasm of nasopharynx, unspecified: Secondary | ICD-10-CM

## 2011-10-01 DIAGNOSIS — K219 Gastro-esophageal reflux disease without esophagitis: Secondary | ICD-10-CM

## 2011-10-01 DIAGNOSIS — D72829 Elevated white blood cell count, unspecified: Secondary | ICD-10-CM

## 2011-10-01 LAB — CBC WITH DIFFERENTIAL/PLATELET
BASO%: 0.2 % (ref 0.0–2.0)
Basophils Absolute: 0 10*3/uL (ref 0.0–0.1)
Eosinophils Absolute: 0 10*3/uL (ref 0.0–0.5)
HCT: 39.6 % (ref 38.4–49.9)
HGB: 13.1 g/dL (ref 13.0–17.1)
LYMPH%: 3.2 % — ABNORMAL LOW (ref 14.0–49.0)
MCHC: 33.1 g/dL (ref 32.0–36.0)
MONO#: 1 10*3/uL — ABNORMAL HIGH (ref 0.1–0.9)
NEUT%: 92.7 % — ABNORMAL HIGH (ref 39.0–75.0)
Platelets: 297 10*3/uL (ref 140–400)
WBC: 26.8 10*3/uL — ABNORMAL HIGH (ref 4.0–10.3)
lymph#: 0.9 10*3/uL (ref 0.9–3.3)

## 2011-10-01 LAB — COMPREHENSIVE METABOLIC PANEL
ALT: 29 U/L (ref 0–53)
BUN: 28 mg/dL — ABNORMAL HIGH (ref 6–23)
CO2: 24 mEq/L (ref 19–32)
Calcium: 8 mg/dL — ABNORMAL LOW (ref 8.4–10.5)
Chloride: 99 mEq/L (ref 96–112)
Creatinine, Ser: 0.97 mg/dL (ref 0.50–1.35)
Glucose, Bld: 78 mg/dL (ref 70–99)
Total Bilirubin: 0.5 mg/dL (ref 0.3–1.2)

## 2011-10-01 MED ORDER — ENOXAPARIN SODIUM 100 MG/ML ~~LOC~~ SOLN: 100.0000 mg | Freq: Every day | SUBCUTANEOUS | Status: DC

## 2011-10-01 MED ORDER — DEXAMETHASONE 4 MG PO TABS: 4.0000 mg | ORAL_TABLET | Freq: Three times a day (TID) | ORAL | Status: DC

## 2011-10-01 MED ORDER — OMEPRAZOLE 20 MG PO CPDR: 20.0000 mg | DELAYED_RELEASE_CAPSULE | Freq: Every day | ORAL | Status: DC

## 2011-10-01 NOTE — Telephone Encounter (Signed)
Disability paperwork given to [facility] for completion.

## 2011-10-01 NOTE — Progress Notes (Signed)
[facility]  Telephone:([phone]) [phone] Fax:(336) [phone]   OFFICE PROGRESS NOTE   Cc:  [name], MD  DIAGNOSIS: Recurrent T2 N2b M0 tonsillar cancer.  PAST THERAPY: He underwent robotic resection with Dr. Hezzie Bump at [facility] in addition to left neck dissection. He was recommended to receive adjuvant radiation therapy. However, he declined at that time. He received his treatment in 2011.  CURRENT THERAPY: The patient was started on Taxotere, Cisplatin, and 5-FU on 09/13/11.  INTERVAL HISTORY: Shane Marsh 61 y.o. male returns for routine follow-up today with his wife. The patient was recently admitted to the hospital for bradycardia. He received his first cycle of induction chemotherapy while hospitalized. He states that he developed arthralgias after the chemotherapy. No fevers noted. He has had nausea, but no vomiting. Using anti-emetics which are effective.No constipation or diarrhea. Reports reflux symptoms. Denies chest pain, shortness of breath, and dyspnea. States he developed bleeding from his previous right IJ cathter site. He was seen in the ER and received a recommendation to hold one dose of Lovenox. The patient tells me today that he did not restart the Lovenox and has been off this for about 3-4 days now. He remains on Dexamethasone.  Appetite is decreased; states  He cannot taste anything. He has lost 10 lbs in 12 days. Denies dysphagia.   Past Medical History  Diagnosis Date  . Hypertension   . Cancer     nasal and throat    Past Surgical History  Procedure Date  . Splectomy   . Tonsillectomy     one side    Current Outpatient Prescriptions  Medication Sig Dispense Refill  . ALPRAZolam (XANAX) 0.25 MG tablet Take 1 tablet (0.25 mg total) by mouth 2 (two) times daily as needed for anxiety.  30 tablet  0  . dexamethasone (DECADRON) 4 MG tablet Take 1 tablet (4 mg total) by mouth 3 (three) times daily.  90 tablet  0  . diphenhydrAMINE (BENADRYL) 25  MG tablet Take 25-50 mg by mouth daily.      Marland Kitchen enoxaparin (LOVENOX) 100 MG/ML injection Inject 1 mL (100 mg total) into the skin daily.  30 Syringe  1  . enoxaparin (LOVENOX) 80 MG/0.8ML injection Inject 0.8 mLs (80 mg total) into the skin every 12 (twelve) hours.  0 Syringe    . fludrocortisone (FLORINEF) 0.1 MG tablet Take 1 tablet (0.1 mg total) by mouth daily.  30 tablet  0  . HYDROcodone-acetaminophen (NORCO) 7.5-325 MG per tablet Take 1 tablet by mouth every 6 (six) hours as needed. For pain      . midodrine (PROAMATINE) 5 MG tablet Take 1 tablet (5 mg total) by mouth 3 (three) times daily with meals.  90 tablet  0  . omeprazole (PRILOSEC) 20 MG capsule Take 1 capsule (20 mg total) by mouth daily.  30 capsule  2  . ondansetron (ZOFRAN) 8 MG tablet Take 1 tablet (8 mg total) by mouth 2 (two) times daily as needed (Nausea or vomiting).  30 tablet  1  . prochlorperazine (COMPAZINE) 10 MG tablet Take 1 tablet (10 mg total) by mouth every 6 (six) hours as needed (Nausea or vomiting).  30 tablet  1    ALLERGIES:  is allergic to morphine and related.  REVIEW OF SYSTEMS:  The rest of the 14-point review of system was negative.   Filed Vitals:   10/01/11 1122  BP: 127/89  Pulse: 66  Temp: 97.1 F (36.2 C)  Wt Readings from Last 3 Encounters:  10/01/11 152 lb 3.2 oz (69.037 kg)  09/19/11 162 lb 11.2 oz (73.8 kg)  08/29/11 182 lb 12.2 oz (82.9 kg)   ECOG Performance status: 1  PHYSICAL EXAMINATION:  General: well-nourished man in no acute distress. Eyes: no scleral icterus. ENT: There were no oropharyngeal lesions on my unaided exam. Neck was without thyromegaly. There was fullness in the left cervical neck. I did not massage it; but was not able to feel any discreet mass. Lymphatics: Negative cervical, supraclavicular or axillary adenopathy. Respiratory: lungs were clear bilaterally without wheezing or crackles. Cardiovascular: Regular rate and rhythm, S1/S2, without murmur, rub or gallop.  There was no pedal edema. GI: abdomen was soft, flat, nontender, nondistended, without organomegaly. Muscoloskeletal: no spinal tenderness of palpation of vertebral spine. Skin exam was without echymosis, petichae. Neuro exam showed left eye not able to adduct laterally. His tongue deviated to the right and uvela to the left. Patient was able to get sit up in bed without assistance. Patient was alerted and oriented x 4. Attention was good. Language was appropriate. Mood was normal without depression. Speech was not pressured. Thought content was not tangential.    LABORATORY/RADIOLOGY DATA:  Lab Results  Component Value Date   WBC 26.8* 10/01/2011   HGB 13.1 10/01/2011   HCT 39.6 10/01/2011   PLT 297 10/01/2011   GLUCOSE 94 09/20/2011   CHOL 229* 08/29/2011   TRIG 143 08/29/2011   HDL 34* 08/29/2011   LDLCALC 166* 08/29/2011   ALKPHOS 50 09/14/2011   ALT 108* 09/14/2011   AST 35 09/14/2011   NA 136 09/20/2011   K 3.7 09/20/2011   CL 103 09/20/2011   CREATININE 0.87 09/20/2011   BUN 24* 09/20/2011   CO2 26 09/20/2011   INR 1.14 09/20/2011   HGBA1C 5.9* 08/28/2011    Ct Soft Tissue Neck W Contrast  09/11/2011  *RADIOLOGY REPORT*  Clinical Data: Nasopharyngeal carcinoma.  CT NECK WITH CONTRAST  Technique:  Multidetector CT imaging of the neck was performed with intravenous contrast.  Contrast: OMNIPAQUE IOHEXOL 350 MG/ML SOLN  Comparison: PET scan 09/04/2011.  MR head 08/29/2011.  Findings: Large left posterior nasopharyngeal mass with extension across the midline, into the skull base, and laterally extending to the parapharyngeal and carotid space.  Exact measurement of the mass is difficult due to its pleomorphic nature.  At the level of the foramen magnum, cross-sectional measurements are 41 x 51 mm. At the level of C2, where the mass extends more laterally, the mass measures 24 x 84 mm.  Craniocaudal extent is approximately 55 mm. There is fullness in the left cavernous sinus which on previous MR  represents intracranial extension.  There is slight fullness enhancement of the dura on the left at the foramen magnum level also suggesting intraspinal/intracranial extension.  Clear-cut osseous destruction of the skull base is not seen although MR demonstrates skull base involvement.  The cavernous sinus extension may be via the foramen of Lacerum alongside the carotid artery.  Lateral extension of mass extends to the left parotid space deep lobe; (image 39 series 13) the mass is also contiguous with the pterygoids on the left.  There are surgical clips in the neck just above the thyroid gland on the left.  This may represent previous biopsy.  Bilateral hypermetabolic levels III and left level II lymph nodes as described on PET scan are redemonstrated. It is possible that the large parapharyngeal mass on the left at the level  of C2 represents a conglomerate mass of lymph nodes which is adjacent to the primary lesion.  The airway is not significantly compromised by this lesion.  The paranasal sinuses are clear.  Left mastoid effusion represents eustachian tube dysfunction.  Left internal carotid artery is surrounded by the tumor and narrowed.  (Image 40 series 13). Compared with prior PET scan and a most recent MR, the appearance is similar.  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 No significant progression compared with prior PET scan.  Original Report Authenticated By: Elsie Stain, M.D.   Ct Angio Chest W/cm &/or Wo Cm  09/11/2011  *RADIOLOGY REPORT*  Clinical Data: Bilateral DVT.  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  Syncopal episodes at home.Symptomatic bradycardia.  CT ANGIOGRAPHY CHEST  Technique:  Multidetector CT imaging of the chest using the standard protocol during bolus administration of intravenous contrast. Multiplanar reconstructed images including MIPs were obtained and reviewed to evaluate the vascular anatomy.   Contrast: OMNIPAQUE IOHEXOL 350 MG/ML SOLN  Comparison: None.  Findings: The heart is normal.  No enlargement, coronary calcification, or pericardial effusion.  No hilar or mediastinal lymphadenopathy.  Small right effusion with slight right lower lobe subsegmental atelectasis. No pulmonary nodules are seen.  Good opacification of the pulmonary vasculature.  No evidence for pulmonary embolic disease.  Unremarkable aorta and visualized great vessels.  No upper abdominal pathology.  No acute osseous findings.  IMPRESSION: No evidence for pulmonary embolic disease.  Small right effusion and slight subsegmental atelectasis.  Original Report Authenticated By: Elsie Stain, M.D.   Nm Pet Image Initial (pi) Skull Base To Thigh  09/04/2011  *RADIOLOGY REPORT*  Clinical Data: Initial treatment strategy for nasopharyngeal carcinoma.  NUCLEAR MEDICINE PET SKULL BASE TO THIGH  Fasting Blood Glucose:  96  Technique:  17 mCi F-18 FDG was injected intravenously. CT data was obtained and used for attenuation correction and anatomic localization only.  (This was not acquired as a diagnostic CT examination.) Additional exam technical data entered on technologist worksheet.  Comparison:  08/29/2011  Findings:  Neck: Again identified is a large left parapharyngeal mass which is intensely hypermetabolic compatible with carcinoma.  This measures 2.5 x 6.6 by 6.6 cm and invades the skull base.  The SUV max associated this mass is equal to that 16.4.  Bilateral hypermetabolic cervical adenopathy is identified.  Index left level III lymph node measures 0.1 cm and has an SUV max equal to the 6.4, image 39.  Right level III node measures 1.5 cm and has an SUV max equal to 6.1.  Chest:  No hypermetabolic mediastinal or hilar nodes.  No suspicious pulmonary nodules on the CT scan.  Abdomen/Pelvis:  No abnormal hypermetabolic activity within the liver, pancreas, adrenal glands, or spleen.  No hypermetabolic lymph nodes in the abdomen  or pelvis.  There is a soft tissue attenuating structure within the left upper quadrant of the abdomen is identified measuring 4.1 x 5.4 cm.  Likely splenule.  Advanced calcified atherosclerotic disease affects the abdominal aorta and its branches.  The left common iliac artery measures 2.3 cm in diameter.  [place]:  No focal hypermetabolic activity to suggest skeletal metastasis.  IMPRESSION:  1.  Large left-sided parapharyngeal mass invading the skull base consistent with carcinoma. 2.  Bilateral cervical lymph node hypermetabolic adenopathy.  Original Report Authenticated By: Rosealee Albee, M.D.   Ir Fluoro Guide Cv Line Right  09/20/2011  *RADIOLOGY REPORT*  Clinical Data: Head  and neck carcinoma.  The patient requires a Port-A-Cath for chemotherapy.  IMPLANTED PORT A CATH PLACEMENT WITH ULTRASOUND AND FLUOROSCOPIC GUIDANCE  Sedation:  1.0 mg IV Versed; 50 mcg IV Fentanyl.  Total Moderate Sedation Time:  30 minutes.  Additional Medications:  1 gram IV Ancef.  As antibiotic prophylaxis, Ancef 1 gm was ordered pre-procedure and administered intravenously within one hour of incision. .  Fluoroscopy Time:  0.2 minutes.  Procedure:  The procedure, risks, benefits, and alternatives were explained to the patient.  Questions regarding the procedure were encouraged and answered.  The patient understands and consents to the procedure.  The right neck and chest were prepped with chlorhexidine in a sterile fashion, and a sterile drape was applied covering the operative field.  Maximum barrier sterile technique with sterile gowns and gloves were used for the procedure.  Local anesthesia was provided with 1% lidocaine and lidocaine with epinephrine.  After creating a small venotomy incision, a 21 gauge needle was advanced into the right internal jugular vein under direct, real- time ultrasound guidance.  Ultrasound image documentation was performed.  After securing guidewire access, an 8 Fr dilator was placed.  A J-wire  was kinked to measure appropriate catheter length.  A subcutaneous port pocket was then created along the upper chest wall utilizing sharp and blunt dissection.  Portable cautery was utilized.  The pocket was irrigated with sterile saline.  A single lumen power injectable port was chosen for placement.  The 8 Fr catheter was tunneled from the port pocket site to the venotomy incision.  The port was placed in the pocket and secured with two Ethilon tacking sutures.  External catheter was trimmed to appropriate length based on guidewire measurement.  At the venotomy, an 8 Fr peel-away sheath was placed over a guidewire.  The catheter was then placed through the sheath and the sheath removed.  Final catheter positioning was confirmed and documented with a fluoroscopic spot image.  The port was accessed with a needle and aspirated and flushed with heparinized saline. The needle was left in place.  The venotomy and port pocket incisions were closed with subcutaneous 3-0 Monocryl and subcuticular 4-0 Vicryl.  Dermabond was applied to both incisions.  Complications: None.  No pneumothorax.  Findings:  After catheter placement, the tip lies at the cavoatrial junction.  The catheter aspirates normally and is ready for immediate use.  IMPRESSION:  Placement of single lumen port a cath via right internal jugular vein.  The catheter tip lies at the cavoatrial junction.  A power injectable port a cath was placed and is ready for immediate use.  Original Report Authenticated By: Reola Calkins, M.D.   Ir US Guide Vasc Access Right  09/20/2011  *RADIOLOGY REPORT*  Clinical Data: Head and neck carcinoma.  The patient requires a Port-A-Cath for chemotherapy.  IMPLANTED PORT A CATH PLACEMENT WITH ULTRASOUND AND FLUOROSCOPIC GUIDANCE  Sedation:  1.0 mg IV Versed; 50 mcg IV Fentanyl.  Total Moderate Sedation Time:  30 minutes.  Additional Medications:  1 gram IV Ancef.  As antibiotic prophylaxis, Ancef 1 gm was ordered  pre-procedure and administered intravenously within one hour of incision. .  Fluoroscopy Time:  0.2 minutes.  Procedure:  The procedure, risks, benefits, and alternatives were explained to the patient.  Questions regarding the procedure were encouraged and answered.  The patient understands and consents to the procedure.  The right neck and chest were prepped with chlorhexidine in a sterile fashion, and a sterile drape  was applied covering the operative field.  Maximum barrier sterile technique with sterile gowns and gloves were used for the procedure.  Local anesthesia was provided with 1% lidocaine and lidocaine with epinephrine.  After creating a small venotomy incision, a 21 gauge needle was advanced into the right internal jugular vein under direct, real- time ultrasound guidance.  Ultrasound image documentation was performed.  After securing guidewire access, an 8 Fr dilator was placed.  A J-wire was kinked to measure appropriate catheter length.  A subcutaneous port pocket was then created along the upper chest wall utilizing sharp and blunt dissection.  Portable cautery was utilized.  The pocket was irrigated with sterile saline.  A single lumen power injectable port was chosen for placement.  The 8 Fr catheter was tunneled from the port pocket site to the venotomy incision.  The port was placed in the pocket and secured with two Ethilon tacking sutures.  External catheter was trimmed to appropriate length based on guidewire measurement.  At the venotomy, an 8 Fr peel-away sheath was placed over a guidewire.  The catheter was then placed through the sheath and the sheath removed.  Final catheter positioning was confirmed and documented with a fluoroscopic spot image.  The port was accessed with a needle and aspirated and flushed with heparinized saline. The needle was left in place.  The venotomy and port pocket incisions were closed with subcutaneous 3-0 Monocryl and subcuticular 4-0 Vicryl.  Dermabond was  applied to both incisions.  Complications: None.  No pneumothorax.  Findings:  After catheter placement, the tip lies at the cavoatrial junction.  The catheter aspirates normally and is ready for immediate use.  IMPRESSION:  Placement of single lumen port a cath via right internal jugular vein.  The catheter tip lies at the cavoatrial junction.  A power injectable port a cath was placed and is ready for immediate use.  Original Report Authenticated By: Reola Calkins, M.D.   Dg Chest Port 1 View  09/15/2011  *RADIOLOGY REPORT*  Clinical Data: Evaluate for pulmonary edema  PORTABLE CHEST - 1 VIEW  Comparison: 09/14/2011; 09/11/2011; 08/28/2011 chest CT - 09/11/2011  Findings: Grossly unchanged cardiac silhouette and mediastinal contours.  Stable position of support apparatus.  There is persistent mild elevation of the right hemidiaphragm.  No new focal airspace opacities.  No definite pleural effusion or pneumothorax. Unchanged bones.  Surgical clips overlie the left supraclavicular fossa.  IMPRESSION: Stable examination without definite evidence of pulmonary edema.  Original Report Authenticated By: Waynard Reeds, M.D.   Dg Chest Port 1 View  09/14/2011  *RADIOLOGY REPORT*  Clinical Data: Evaluate pulmonary edema, history of nasal and throat cancer  PORTABLE CHEST - 1 VIEW  Comparison: 09/11/2011; 09/10/18 13; chest CT - 09/11/2011  Findings: Grossly unchanged cardiac silhouette and mediastinal contours.  Stable positioning of support apparatus.  Minimal bibasilar opacities favored to represent atelectasis.  No definite pleural effusion or pneumothorax.   Surgical clips overlie the left supraclavicular fossa.  Unchanged bones.  IMPRESSION: Minimal bibasilar opacities favored to represent atelectasis.  No definite evidence of pulmonary edema.  Original Report Authenticated By: Waynard Reeds, M.D.   Dg Chest Port 1 View  09/11/2011  *RADIOLOGY REPORT*  Clinical Data: Line placement.  PORTABLE CHEST - 1  VIEW  Comparison: CT chest earlier this same day and plain film chest 09/10/2011.  Findings: New right IJ catheter is in place with the tip in the lower superior vena cava.  No  pneumothorax.  Lungs are clear. Heart size is normal.  Surgical clips base of the neck on the left noted.  IMPRESSION: IJ catheter in good position.  No pneumothorax or other acute finding.  Original Report Authenticated By: Bernadene Bell. Maricela Curet, M.D.   Dg Chest Port 1 View  09/11/2011  *RADIOLOGY REPORT*  Clinical Data: Shortness of breath.  Recent diagnosis of nasal pharyngeal carcinoma.  PORTABLE CHEST - 1 VIEW  Comparison: 08/28/2011 and PET of 09/04/2011.  Findings: Surgical clips at the left side of the thoracic inlet. Midline trachea.  Normal heart size.  No pleural effusion or pneumothorax.  Clear lungs.  IMPRESSION: No acute cardiopulmonary disease.  Original Report Authenticated By: Consuello Bossier, M.D.   ASSESSMENT AND PLAN:   1. 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 The patient is s/p 1 cycle of Taxotere, Cisplatin, and 5-FU. The patient experienced grade 1 fatigue and grade 1 anorexia/weight loss. Recommend that he proceed with cycle 2 of his chemotherapy on 10/04/11 without dose modification.   2. Bilateral DVT. The patient was started on Lovenox during his hospitalization. He stopped this over the weekend. Discussed treatment for DVT should be at least 3-6 months. The patient asked if he could switch to once a day dosing. I have written a prescription for the patient to begin Lovenox 100 mg sq daily.  3. Protein calorie malnutrition. Decreased appetite and weight loss. I have referred the patient to [facility] [place]. May need to consider Marinol in the future.  4. GERD. I have written a prescription for Omeprazole.  5.  Leukocytosis. Likely due to Dexamethasone. Will try to wean this in the near future.  6. Hypotension. Resolved  7. Bradycardia. Resolved.  8. Follow-up. In about 3 weeks prior to cycle 3 of his chemotherapy.   The length of time of the face-to-face encounter was 40 minutes. More than 50% of time was spent counseling and coordination of care.

## 2011-10-01 NOTE — Patient Instructions (Addendum)
Come for chemo as scheduled 10/04/11.  I have refilled you Dexamethasone today.  Restart Lovenox 100 mg sq once a day.  I have sent a prescription for Prilosec (Omeprazole) to your pharmacy for acid reflux.  You will have a lab and visit before your chemo on 7/26.

## 2011-10-01 NOTE — Progress Notes (Signed)
Faxed clinical information to [employer] attn: Doy Mince @ [phone].

## 2011-10-01 NOTE — Telephone Encounter (Signed)
gve the pt his July 2013 appt calendar °

## 2011-10-04 ENCOUNTER — Ambulatory Visit (HOSPITAL_BASED_OUTPATIENT_CLINIC_OR_DEPARTMENT_OTHER): Payer: 59

## 2011-10-04 ENCOUNTER — Telehealth: Payer: Self-pay | Admitting: Oncology

## 2011-10-04 ENCOUNTER — Other Ambulatory Visit: Payer: Self-pay | Admitting: Oncology

## 2011-10-04 ENCOUNTER — Other Ambulatory Visit: Payer: Self-pay | Admitting: Lab

## 2011-10-04 ENCOUNTER — Other Ambulatory Visit: Payer: Self-pay | Admitting: Pharmacist

## 2011-10-04 VITALS — BP 108/74 | HR 56 | Temp 97.8°F

## 2011-10-04 DIAGNOSIS — Z5111 Encounter for antineoplastic chemotherapy: Secondary | ICD-10-CM

## 2011-10-04 DIAGNOSIS — I959 Hypotension, unspecified: Secondary | ICD-10-CM

## 2011-10-04 DIAGNOSIS — R001 Bradycardia, unspecified: Secondary | ICD-10-CM

## 2011-10-04 DIAGNOSIS — C119 Malignant neoplasm of nasopharynx, unspecified: Secondary | ICD-10-CM

## 2011-10-04 MED ORDER — SODIUM CHLORIDE 0.9 % IV SOLN
750.0000 mg/m2/d | INTRAVENOUS | Status: DC
  Administered 2011-10-04: 7350 mg via INTRAVENOUS
  Filled 2011-10-04: qty 147

## 2011-10-04 MED ORDER — DEXAMETHASONE SODIUM PHOSPHATE 4 MG/ML IJ SOLN
12.0000 mg | Freq: Once | INTRAMUSCULAR | Status: AC
  Administered 2011-10-04: 12 mg via INTRAVENOUS

## 2011-10-04 MED ORDER — POTASSIUM CHLORIDE 2 MEQ/ML IV SOLN
Freq: Once | INTRAVENOUS | Status: AC
  Administered 2011-10-04: 09:00:00 via INTRAVENOUS
  Filled 2011-10-04: qty 10

## 2011-10-04 MED ORDER — SODIUM CHLORIDE 0.9 % IV SOLN
75.0000 mg/m2 | Freq: Once | INTRAVENOUS | Status: AC
  Administered 2011-10-04: 147 mg via INTRAVENOUS
  Filled 2011-10-04: qty 147

## 2011-10-04 MED ORDER — DOCETAXEL CHEMO INJECTION 160 MG/16ML
75.0000 mg/m2 | Freq: Once | INTRAVENOUS | Status: AC
  Administered 2011-10-04: 150 mg via INTRAVENOUS
  Filled 2011-10-04: qty 15

## 2011-10-04 MED ORDER — SODIUM CHLORIDE 0.9 % IV SOLN
150.0000 mg | Freq: Once | INTRAVENOUS | Status: AC
  Administered 2011-10-04: 150 mg via INTRAVENOUS
  Filled 2011-10-04: qty 5

## 2011-10-04 MED ORDER — PALONOSETRON HCL INJECTION 0.25 MG/5ML
0.2500 mg | Freq: Once | INTRAVENOUS | Status: AC
  Administered 2011-10-04: 0.25 mg via INTRAVENOUS

## 2011-10-04 MED ORDER — SODIUM CHLORIDE 0.9 % IV SOLN
Freq: Once | INTRAVENOUS | Status: AC
  Administered 2011-10-04: 09:00:00 via INTRAVENOUS

## 2011-10-04 NOTE — Progress Notes (Signed)
Urine output prior to cisplatin 265cc and additional 600 cc after cisplatin.  Home instructions for fluid intake prior to next infusion of cisplatin given to patient

## 2011-10-04 NOTE — Telephone Encounter (Signed)
Added lb/fu for 7/15. Pt given new July schedule while in infusion.

## 2011-10-04 NOTE — Telephone Encounter (Signed)
Pt needs appt w/dr skains. Will call Monday.

## 2011-10-04 NOTE — Progress Notes (Signed)
Patient and wife instructed on home infusion pump, changing batteries, #'s  to call for problems during chemo hours and after hours, chemo spill kit, and time to return for infusion pump disconnect.  Patient and wife verbalized their understanding of instructions and all questions answered.

## 2011-10-04 NOTE — Patient Instructions (Signed)
Call MD for problems 

## 2011-10-07 ENCOUNTER — Telehealth: Payer: Self-pay | Admitting: *Deleted

## 2011-10-07 ENCOUNTER — Telehealth: Payer: Self-pay | Admitting: Oncology

## 2011-10-07 NOTE — Telephone Encounter (Signed)
S/w pt re appt w/dr skains for 7/12 @ 9 am. Pt given appt d/t/location/phone number. Pt has appts @ [facility] already,

## 2011-10-07 NOTE — Telephone Encounter (Signed)
Inpatient cardiologists prescribed Florinef and Midodrine for his hypotension and bradycardia.  Either cardiologist or his PCP needs to monitor these medications and refill since it's outside of my area of expertise.  I doubt that his PCP will want to resume responsibility since he did not prescribe them himself.    Thanks.

## 2011-10-07 NOTE — Telephone Encounter (Signed)
I have called and moved the nutrition  appt to follow the pump appt. Patient called and given new appt.   JMW

## 2011-10-07 NOTE — Telephone Encounter (Signed)
Called pt back and informed of Dr. Lodema Pilot response below.  Encouraged pt to either call his PCP to manage these meds or keep his appt w/ Cardiology.  Pt states "as far as I know"  He is still taking these meds (florinef and midodrine).  He verbalized understanding of reason for referral.

## 2011-10-07 NOTE — Telephone Encounter (Signed)
Pt called to ask why Dr. Gaylyn Rong wants him to see the cardiologist Dr. Anne Fu?  Pt states he can't afford "so many co-pays" and says he thought there wasn't anything wrong with his heart.   Informed pt I will try to find out the reason for this referral and call him back.

## 2011-10-09 ENCOUNTER — Ambulatory Visit (HOSPITAL_BASED_OUTPATIENT_CLINIC_OR_DEPARTMENT_OTHER): Payer: 59

## 2011-10-09 ENCOUNTER — Ambulatory Visit: Payer: 59 | Admitting: Nutrition

## 2011-10-09 ENCOUNTER — Ambulatory Visit: Payer: Self-pay

## 2011-10-09 VITALS — BP 93/62 | HR 103 | Temp 98.8°F

## 2011-10-09 DIAGNOSIS — C119 Malignant neoplasm of nasopharynx, unspecified: Secondary | ICD-10-CM

## 2011-10-09 MED ORDER — SODIUM CHLORIDE 0.9 % IJ SOLN
10.0000 mL | INTRAMUSCULAR | Status: DC | PRN
  Administered 2011-10-09: 10 mL
  Filled 2011-10-09: qty 10

## 2011-10-09 MED ORDER — HEPARIN SOD (PORK) LOCK FLUSH 100 UNIT/ML IV SOLN
500.0000 [IU] | Freq: Once | INTRAVENOUS | Status: AC | PRN
  Administered 2011-10-09: 500 [IU]
  Filled 2011-10-09: qty 5

## 2011-10-09 NOTE — Assessment & Plan Note (Signed)
Shane Marsh is a 61 year old male patient of Dr. Gaylyn Rong diagnosed with recurrent tonsil cancer.  MEDICAL HISTORY INCLUDES:  Hypertension, bilateral DVTs and GERD.  MEDICATIONS INCLUDE:  Xanax, Decadron, Prilosec, Zofran and Compazine.  LABS:  Sodium of 132, BUN of 28, albumin 2.7 on July 2nd.  HEIGHT:  68 inches. WEIGHT:  144.1 pounds. USUAL BODY WEIGHT:  182 pounds May 30th. BMI:  21.92.  ESTIMATED NUTRITION NEEDS:  2100-2300 calories, 111-125 grams protein, 2.3 L fluid.  PATIENT STATES:  Majority of weight loss was during recent hospitalization where he was not fed for 2 weeks.  He reports history of poor appetite and taste alterations, both of which have improved.  He tells me that he is eating much better than he was.  The patient consumes 2 meals daily and 1 Boost High Protein drink.  He states that he is going to get a feeding tube but it has not been scheduled yet. Patient not concerned with weight loss and states he was too heavy to begin with.  NUTRITION DIAGNOSIS:  Unintended weight loss related to diagnosis of recurrent tonsil cancer and associated treatments as evidenced by a 21% weight loss in 6 weeks.    The patient meets criteria for severe malnutrition in the context of acute injury and illness secondary to less than 50% of his estimated energy requirements for greater than 5 days and greater than 5% weight loss in 1 month.  INTERVENTION:  I educated the patient on the importance of increasing calories and protein in smaller amounts throughout the day.  I have encouraged him to add in a 3rd meal to his daily intake.  The patient enjoys buttermilk, so I have asked him to increase buttermilk to b.i.d. with 2 of his meals.  In addition, I have recommended the patient increase Boost High Protein to Boost Plus b.i.d. to provide additional calories and protein.  I have educated him on strategies for dealing with poor appetite and taste alterations.  I provided fact sheets and contact  information as well as coupons for Boost.  I have educated the patient on the dangers of continued weight loss and how our goal for him is weight stabilization and maintenance throughout treatment.  MONITORING/EVALUATION (GOALS):  Patient will tolerate increased oral intake and 3 meals daily.  He will add buttermilk b.i.d. and Boost Plus b.i.d.  NEXT VISIT:  Thursday, July, 25th during chemotherapy.   ______________________________ Zenovia Jarred, RD, LDN Clinical Nutrition Specialist BN/MEDQ  D:  10/09/2011  T:  10/09/2011  Job:  [id number]

## 2011-10-09 NOTE — Patient Instructions (Signed)
Call MD for problems 

## 2011-10-14 ENCOUNTER — Encounter: Payer: Self-pay | Admitting: Oncology

## 2011-10-14 ENCOUNTER — Other Ambulatory Visit (HOSPITAL_BASED_OUTPATIENT_CLINIC_OR_DEPARTMENT_OTHER): Payer: 59 | Admitting: Lab

## 2011-10-14 ENCOUNTER — Ambulatory Visit (HOSPITAL_BASED_OUTPATIENT_CLINIC_OR_DEPARTMENT_OTHER): Payer: 59

## 2011-10-14 ENCOUNTER — Telehealth: Payer: Self-pay | Admitting: Oncology

## 2011-10-14 ENCOUNTER — Ambulatory Visit (HOSPITAL_BASED_OUTPATIENT_CLINIC_OR_DEPARTMENT_OTHER): Payer: 59 | Admitting: Oncology

## 2011-10-14 VITALS — BP 83/62 | HR 105 | Temp 97.4°F | Wt 139.4 lb

## 2011-10-14 DIAGNOSIS — I82409 Acute embolism and thrombosis of unspecified deep veins of unspecified lower extremity: Secondary | ICD-10-CM

## 2011-10-14 DIAGNOSIS — Z5189 Encounter for other specified aftercare: Secondary | ICD-10-CM

## 2011-10-14 DIAGNOSIS — E46 Unspecified protein-calorie malnutrition: Secondary | ICD-10-CM

## 2011-10-14 DIAGNOSIS — C119 Malignant neoplasm of nasopharynx, unspecified: Secondary | ICD-10-CM

## 2011-10-14 DIAGNOSIS — E860 Dehydration: Secondary | ICD-10-CM

## 2011-10-14 DIAGNOSIS — C111 Malignant neoplasm of posterior wall of nasopharynx: Secondary | ICD-10-CM

## 2011-10-14 DIAGNOSIS — D702 Other drug-induced agranulocytosis: Secondary | ICD-10-CM

## 2011-10-14 DIAGNOSIS — K219 Gastro-esophageal reflux disease without esophagitis: Secondary | ICD-10-CM

## 2011-10-14 LAB — COMPREHENSIVE METABOLIC PANEL
ALT: 26 U/L (ref 0–53)
AST: 8 U/L (ref 0–37)
Alkaline Phosphatase: 55 U/L (ref 39–117)
Creatinine, Ser: 1.05 mg/dL (ref 0.50–1.35)
Sodium: 134 mEq/L — ABNORMAL LOW (ref 135–145)
Total Bilirubin: 0.8 mg/dL (ref 0.3–1.2)
Total Protein: 5.3 g/dL — ABNORMAL LOW (ref 6.0–8.3)

## 2011-10-14 LAB — CBC WITH DIFFERENTIAL/PLATELET
BASO%: 0.1 % (ref 0.0–2.0)
EOS%: 0.3 % (ref 0.0–7.0)
Eosinophils Absolute: 0 10*3/uL (ref 0.0–0.5)
LYMPH%: 19.8 % (ref 14.0–49.0)
MCH: 31.8 pg (ref 27.2–33.4)
MCHC: 35.1 g/dL (ref 32.0–36.0)
MCV: 90.6 fL (ref 79.3–98.0)
MONO%: 6.3 % (ref 0.0–14.0)
NEUT#: 0.6 10*3/uL — ABNORMAL LOW (ref 1.5–6.5)
RBC: 4.15 10*6/uL — ABNORMAL LOW (ref 4.20–5.82)
RDW: 14.1 % (ref 11.0–14.6)
nRBC: 0 % (ref 0–0)

## 2011-10-14 MED ORDER — SODIUM CHLORIDE 0.9 % IV SOLN
Freq: Once | INTRAVENOUS | Status: AC
  Administered 2011-10-14: 16:00:00 via INTRAVENOUS

## 2011-10-14 MED ORDER — SUCRALFATE 1 GM/10ML PO SUSP: 1.0000 g | Freq: Four times a day (QID) | ORAL | Status: DC

## 2011-10-14 MED ORDER — PEGFILGRASTIM INJECTION 6 MG/0.6ML
6.0000 mg | Freq: Once | SUBCUTANEOUS | Status: AC
  Administered 2011-10-14: 6 mg via SUBCUTANEOUS
  Filled 2011-10-14: qty 0.6

## 2011-10-14 MED ORDER — ESCITALOPRAM OXALATE 5 MG PO TABS: 5.0000 mg | ORAL_TABLET | Freq: Every day | ORAL | Status: DC

## 2011-10-14 NOTE — Telephone Encounter (Signed)
appts made and printed for pt aom °

## 2011-10-14 NOTE — Progress Notes (Signed)
[facility]  [phone] [phone]   OFFICE PROGRESS NOTE   Cc:  [name], MD  DIAGNOSIS: Recurrent T2 N2b M0 tonsillar cancer.  PAST THERAPY: He underwent robotic resection with Dr. Hezzie Bump at [facility] in addition to left neck dissection. He was recommended to receive adjuvant radiation therapy. However, he declined at that time. He received his treatment in 2011.  CURRENT THERAPY: The patient was started on Taxotere, Cisplatin, and 5-FU on 09/13/11.  INTERVAL HISTORY: Shane Marsh 61 y.o. male returns for mid-cycle follow-up today with his wife. The patient received his second cycle of Taxotere, Cisplatin, and 5-FU on 10/04/11. He has been weak since receiving his chemotherapy; he gets up to the chair for a few hours per day, but is not really leaving the house. He is not really eating and states he is only drinking about 16 ounces of water per day. Review of his weights show a 43 lbs weight loss from 08/29/11 to today (weight on 08/29/11 was 182.7). He states that he has nausea, but no vomiting. Reports that his stomach feels like it is "on fire." Remains on Omeprazole. Taking Decadron twice a day. Patient denies syncopal episodes. No fevers or chills. Denies chest pain, shortness of breath, and dyspnea. Denies any bleeding, remains on Lovenox for DVT. Wife thinks that the patient is depressed; he is more withdrawn than usual.   Past Medical History  Diagnosis Date  . Hypertension   . Cancer     nasal and throat  . GERD (gastroesophageal reflux disease) 10/15/2011  . Protein calorie malnutrition 10/15/2011    Past Surgical History  Procedure Date  . Splectomy   . Tonsillectomy     one side    Current Outpatient Prescriptions  Medication Sig Dispense Refill  . dexamethasone (DECADRON) 4 MG tablet Take 4 mg by mouth 2 (two) times daily with a meal.      . diphenhydrAMINE (BENADRYL) 25 MG tablet Take 25-50 mg by mouth daily.      Marland Kitchen enoxaparin  (LOVENOX) 100 MG/ML injection Inject 1 mL (100 mg total) into the skin daily.  30 Syringe  1  . fludrocortisone (FLORINEF) 0.1 MG tablet Take 1 tablet (0.1 mg total) by mouth daily.  30 tablet  0  . midodrine (PROAMATINE) 5 MG tablet Take 1 tablet (5 mg total) by mouth 3 (three) times daily with meals.  90 tablet  0  . omeprazole (PRILOSEC) 20 MG capsule Take 1 capsule (20 mg total) by mouth daily.  30 capsule  2  . ondansetron (ZOFRAN) 8 MG tablet Take 1 tablet (8 mg total) by mouth 2 (two) times daily as needed (Nausea or vomiting).  30 tablet  1  . prochlorperazine (COMPAZINE) 10 MG tablet Take 1 tablet (10 mg total) by mouth every 6 (six) hours as needed (Nausea or vomiting).  30 tablet  1  . ALPRAZolam (XANAX) 0.25 MG tablet Take 1 tablet (0.25 mg total) by mouth 2 (two) times daily as needed for anxiety.  30 tablet  0  . escitalopram (LEXAPRO) 5 MG tablet Take 1 tablet (5 mg total) by mouth daily.  30 tablet  1  . HYDROcodone-acetaminophen (NORCO) 7.5-325 MG per tablet Take 1 tablet by mouth every 6 (six) hours as needed. For pain      . sucralfate (CARAFATE) 1 GM/10ML suspension Take 10 mLs (1 g total) by mouth 4 (four) times daily.  420 mL  2   Current Facility-Administered Medications  Medication Dose Route Frequency Provider Last Rate Last Dose  . 0.9 %  sodium chloride infusion   Intravenous Once Myrtis Ser, NP      . pegfilgrastim (NEULASTA) injection 6 mg  6 mg Subcutaneous Once Myrtis Ser, NP   6 mg at 10/14/11 1639    ALLERGIES:  is allergic to morphine and related.  REVIEW OF SYSTEMS:  The rest of the 14-point review of system was negative.   Filed Vitals:   10/14/11 1434  BP: 83/62  Pulse: 105  Temp: 97.4 F (36.3 C)   Wt Readings from Last 3 Encounters:  10/14/11 139 lb 6.4 oz (63.231 kg)  10/09/11 144 lb 1.6 oz (65.363 kg)  10/01/11 152 lb 3.2 oz (69.037 kg)   ECOG Performance status: 2  PHYSICAL EXAMINATION:  General: well-nourished man in no acute  distress. Eyes: no scleral icterus. ENT: There were no oropharyngeal lesions on my unaided exam. Neck was without thyromegaly. There was fullness in the left cervical neck. I did not massage it; but was not able to feel any discreet mass. Lymphatics: Negative cervical, supraclavicular or axillary adenopathy. Respiratory: lungs were clear bilaterally without wheezing or crackles. Cardiovascular: Regular rate and rhythm, S1/S2, without murmur, rub or gallop. There was no pedal edema. GI: abdomen was soft, flat, nontender, nondistended, without organomegaly. Muscoloskeletal: no spinal tenderness of palpation of vertebral spine. Skin exam was without echymosis, petichae. Neuro exam showed left eye not able to adduct laterally. His tongue deviated to the right and uvela to the left. Patient in a wheelchair today and stated he could not get up onto the exam table. Patient was alerted and oriented x 4. Attention was good. Language was appropriate. Mood was normal without depression. Speech was not pressured. Thought content was not tangential.    LABORATORY/RADIOLOGY DATA:  Lab Results  Component Value Date   WBC 0.9* 10/14/2011   HGB 13.2 10/14/2011   HCT 37.6* 10/14/2011   PLT 71* 10/14/2011   GLUCOSE 118* 10/14/2011   CHOL 229* 08/29/2011   TRIG 143 08/29/2011   HDL 34* 08/29/2011   LDLCALC 166* 08/29/2011   ALKPHOS 55 10/14/2011   ALT 26 10/14/2011   AST 8 10/14/2011   NA 134* 10/14/2011   K 4.2 10/14/2011   CL 95* 10/14/2011   CREATININE 1.05 10/14/2011   BUN 22 10/14/2011   CO2 25 10/14/2011   INR 1.14 09/20/2011   HGBA1C 5.9* 08/28/2011    ASSESSMENT AND PLAN:   1. 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 The patient is s/p 2 cycles of Taxotere, Cisplatin, and 5-FU. The patient has grade 2-3 fatigue, grade 2  anorexia/weight loss, grade 3 neutropenia, grade 1 thrombocytopenia, and grade 2-3 nausea. Dr Gaylyn Rong has spoken with the patient today regarding his treatment and prognosis. Patient is tolerating chemotherapy poorly at this time and may need to consider stopping induction chemotherapy and switching to concurrent chemotherapy with XRT. Dr Gaylyn Rong has reviewed with the patient and wife that chance of cure is about 10-20%. Will plan to obtain a PET scan next week to evaluate response to treatment and decide how to proceed with further treatment. The patient seems undecided about further chemotherapy, but we will delay and decisions until the patient's side effects improve and after we has assessed response to treatment with a PET scan. He was instructed  to decrease his Decadron to once a day.  2. Bilateral DVT. He remains on Lovenox 100 mg sq daily.  3. Protein calorie malnutrition. Significant weight loss. He is followed by Vernell Leep. Will schedule PEG tube placement within the next week.  4. GERD. I have written a prescription for Omeprazole. I have added Carafate qid and he may also use Pepto-Bismol as needed.  5. Neutropenia. Due to chemotherapy. He was educated about neutropenic precautions. He received Neulasta here in our office today. He was instructed to call for temp >100.5.  6. Hypotension. He is hypotensive today. He remains on Florinef and Proamatine. He will follow-up with Cardiology.   7. Bradycardia. Resolved.  8. Dehydration. He has poor oral intake. He will receive a liter of Normal Saline here in our office today.  9. Depression. I have written a prescription for Lexapro 5 mg daily.  10. Follow-up. On 7/24.  11. Psychosocial. He was referred to our Child psychotherapist today.  The length of time of the face-to-face encounter was 40 minutes. More than 50% of time was spent counseling and coordination of care.

## 2011-10-14 NOTE — Patient Instructions (Addendum)
Your white blood count is low. You received Neulasta in our office today to help boost your white blood cells. Please watch your temperature and call if your fever is above 100.5.  You will receive IV fluids today. We will plan to have you come back to see Dr Gaylyn Rong on 10/23/11 for a visit and possible IV fluids on that day as well.  Decrease your dexamethasone (Decadron) to 1 tablet daily with food.  I have sent a prescription for Carafate to your pharmacy. Use this 4 times a day to help with the burning sensation in your stomach. You may also use Pepto-Bismol over the counter 2-3 times per day.  Due to your weight loss, I have referred you to the Interventional Radiology Department to have a PEG tube (feeding tube) placed.  I have sent a prescription for Lexapro to your pharmacy to begin for depression. It takes 4-6 weeks for full effectiveness.

## 2011-10-15 ENCOUNTER — Other Ambulatory Visit: Payer: Self-pay | Admitting: Radiology

## 2011-10-15 ENCOUNTER — Encounter: Payer: Self-pay | Admitting: Oncology

## 2011-10-15 ENCOUNTER — Other Ambulatory Visit: Payer: Self-pay | Admitting: Radiation Oncology

## 2011-10-15 DIAGNOSIS — K219 Gastro-esophageal reflux disease without esophagitis: Secondary | ICD-10-CM | POA: Insufficient documentation

## 2011-10-15 DIAGNOSIS — E46 Unspecified protein-calorie malnutrition: Secondary | ICD-10-CM | POA: Insufficient documentation

## 2011-10-15 DIAGNOSIS — E860 Dehydration: Secondary | ICD-10-CM | POA: Insufficient documentation

## 2011-10-15 HISTORY — DX: Unspecified protein-calorie malnutrition: E46

## 2011-10-15 HISTORY — DX: Gastro-esophageal reflux disease without esophagitis: K21.9

## 2011-10-16 ENCOUNTER — Ambulatory Visit (HOSPITAL_BASED_OUTPATIENT_CLINIC_OR_DEPARTMENT_OTHER): Payer: 59

## 2011-10-16 ENCOUNTER — Other Ambulatory Visit: Payer: Self-pay | Admitting: Oncology

## 2011-10-16 ENCOUNTER — Ambulatory Visit: Payer: 59 | Admitting: Nutrition

## 2011-10-16 DIAGNOSIS — E860 Dehydration: Secondary | ICD-10-CM

## 2011-10-16 DIAGNOSIS — C119 Malignant neoplasm of nasopharynx, unspecified: Secondary | ICD-10-CM

## 2011-10-16 MED ORDER — SODIUM CHLORIDE 0.9 % IV SOLN
1000.0000 mL | Freq: Once | INTRAVENOUS | Status: DC
  Administered 2011-10-16: 1000 mL via INTRAVENOUS

## 2011-10-16 MED ORDER — MAGIC MOUTHWASH W/LIDOCAINE: 5.0000 mL | Freq: Four times a day (QID) | ORAL | Status: DC

## 2011-10-16 NOTE — Progress Notes (Signed)
Shane Marsh continues to have difficulty eating.  I was contacted by nursing to work patient into my schedule today.  Apparently, the patient has a feeding tube scheduled for placement on Friday, July 19th.  In speaking with Mr. and [name] today, patient is eating very little by mouth.  He tolerates ice chips, a little bit of water, and some applesauce.  He apparently has not eaten for several days.  His weight has decreased to 139.4 pounds on July 15th from 144.1 pounds July 10th.  NUTRITION DIAGNOSIS:  Unintended weight loss continues.  INTERVENTION:  I have educated Shane Marsh on pureeing and blenderizing foods to help ease his swallowing.  I have encouraged him to take medications as prescribed by the nurse practitioner on Monday that will help ease his swallowing.  I have encouraged liquid oral nutrition supplements as tolerated for the patient and I have provided him with a sample of Resource Breeze today to see if he would tolerate the juice-based supplements.  I have briefly discussed bolus and continuous tube feeding options with the patient. The patient has chosen [facility] at his home care agency and prefers to utilize the bolus method for tube feeding.  I have contacted [facility] for referral for nursing to educate on PEG care and administration of tube feeding.  I am waiting for a call back from them.  MONITORING, EVALUATION, AND GOALS:  The patient will tolerate increased oral intake along with bolus tube feedings to minimize further weight loss.  NEXT VISIT:  Friday, July 19th.  I will educate the patient's wife with his permission on bolus tube feeding schedule.   ______________________________ Zenovia Jarred, RD,CSO,LDN Clinical Nutrition Specialist BN/MEDQ  D:  10/16/2011  T:  10/16/2011  Job:  [id number]

## 2011-10-16 NOTE — Patient Instructions (Addendum)
Dehydration, Adult Dehydration is when you lose more fluids from the body than you take in. Vital organs like the kidneys, brain, and heart cannot function without a proper amount of fluids and salt. Any loss of fluids from the body can cause dehydration.  CAUSES   Vomiting.   Diarrhea.   Excessive sweating.   Excessive urine output.   Fever.  SYMPTOMS  Mild dehydration  Thirst.   Dry lips.   Slightly dry mouth.  Moderate dehydration  Very dry mouth.   Sunken eyes.   Skin does not bounce back quickly when lightly pinched and released.   Dark urine and decreased urine production.   Decreased tear production.   Headache.  Severe dehydration  Very dry mouth.   Extreme thirst.   Rapid, weak pulse (more than 100 beats per minute at rest).   Cold hands and feet.   Not able to sweat in spite of heat and temperature.   Rapid breathing.   Blue lips.   Confusion and lethargy.   Difficulty being awakened.   Minimal urine production.   No tears.  DIAGNOSIS  Your caregiver will diagnose dehydration based on your symptoms and your exam. Blood and urine tests will help confirm the diagnosis. The diagnostic evaluation should also identify the cause of dehydration. TREATMENT  Treatment of mild or moderate dehydration can often be done at home by increasing the amount of fluids that you drink. It is best to drink small amounts of fluid more often. Drinking too much at one time can make vomiting worse. Refer to the home care instructions below. Severe dehydration needs to be treated at the hospital where you will probably be given intravenous (IV) fluids that contain water and electrolytes. HOME CARE INSTRUCTIONS   Ask your caregiver about specific rehydration instructions.   Drink enough fluids to keep your urine clear or pale yellow.   Drink small amounts frequently if you have nausea and vomiting.   Eat as you normally do.   Avoid:   Foods or drinks high in  sugar.   Carbonated drinks.   Juice.   Extremely hot or cold fluids.   Drinks with caffeine.   Fatty, greasy foods.   Alcohol.   Tobacco.   Overeating.   Gelatin desserts.   Wash your hands well to avoid spreading bacteria and viruses.   Only take over-the-counter or prescription medicines for pain, discomfort, or fever as directed by your caregiver.   Ask your caregiver if you should continue all prescribed and over-the-counter medicines.   Keep all follow-up appointments with your caregiver.  SEEK MEDICAL CARE IF:  You have abdominal pain and it increases or stays in one area (localizes).   You have a rash, stiff neck, or severe headache.   You are irritable, sleepy, or difficult to awaken.   You are weak, dizzy, or extremely thirsty.  SEEK IMMEDIATE MEDICAL CARE IF:   You are unable to keep fluids down or you get worse despite treatment.   You have frequent episodes of vomiting or diarrhea.   You have blood or green matter (bile) in your vomit.   You have blood in your stool or your stool looks black and tarry.   You have not urinated in 6 to 8 hours, or you have only urinated a small amount of very dark urine.   You have a fever.   You faint.  MAKE SURE YOU:   Understand these instructions.   Will watch your condition.     Will get help right away if you are not doing well or get worse.  Document Released: 03/18/2005 Document Revised: 03/07/2011 Document Reviewed: 11/05/2010 Exit[employer] Patient Information 2012 [employer]. 

## 2011-10-17 ENCOUNTER — Other Ambulatory Visit: Payer: Self-pay | Admitting: Certified Registered Nurse Anesthetist

## 2011-10-18 ENCOUNTER — Ambulatory Visit (HOSPITAL_COMMUNITY): Admission: RE | Admit: 2011-10-18 | Payer: 59 | Source: Ambulatory Visit

## 2011-10-18 ENCOUNTER — Ambulatory Visit: Payer: 59 | Admitting: Nutrition

## 2011-10-18 ENCOUNTER — Ambulatory Visit (HOSPITAL_COMMUNITY)
Admission: RE | Admit: 2011-10-18 | Discharge: 2011-10-18 | Disposition: A | Discharge status: Home / Self Care | Payer: 59 | Source: Ambulatory Visit | Attending: Oncology | Admitting: Oncology

## 2011-10-18 ENCOUNTER — Ambulatory Visit (HOSPITAL_COMMUNITY)
Admission: RE | Admit: 2011-10-18 | Discharge: 2011-10-18 | Disposition: A | Discharge status: Home / Self Care | Payer: 59 | Source: Ambulatory Visit | Attending: Diagnostic Radiology | Admitting: Diagnostic Radiology

## 2011-10-18 DIAGNOSIS — C119 Malignant neoplasm of nasopharynx, unspecified: Secondary | ICD-10-CM | POA: Insufficient documentation

## 2011-10-18 DIAGNOSIS — I10 Essential (primary) hypertension: Secondary | ICD-10-CM | POA: Insufficient documentation

## 2011-10-18 DIAGNOSIS — E46 Unspecified protein-calorie malnutrition: Secondary | ICD-10-CM | POA: Insufficient documentation

## 2011-10-18 DIAGNOSIS — Z79899 Other long term (current) drug therapy: Secondary | ICD-10-CM | POA: Insufficient documentation

## 2011-10-18 DIAGNOSIS — K219 Gastro-esophageal reflux disease without esophagitis: Secondary | ICD-10-CM | POA: Insufficient documentation

## 2011-10-18 LAB — CBC
Hemoglobin: 11.7 g/dL — ABNORMAL LOW (ref 13.0–17.0)
MCH: 31 pg (ref 26.0–34.0)
MCHC: 34.8 g/dL (ref 30.0–36.0)
RDW: 15.8 % — ABNORMAL HIGH (ref 11.5–15.5)

## 2011-10-18 LAB — PROTIME-INR: Prothrombin Time: 16.2 seconds — ABNORMAL HIGH (ref 11.6–15.2)

## 2011-10-18 MED ORDER — SODIUM CHLORIDE 0.9 % IV SOLN: INTRAVENOUS | Status: DC

## 2011-10-18 MED ORDER — HYDROCODONE-ACETAMINOPHEN 5-325 MG PO TABS: 1.0000 | ORAL_TABLET | ORAL | Status: DC | PRN

## 2011-10-18 MED ORDER — FENTANYL CITRATE 0.05 MG/ML IJ SOLN
INTRAMUSCULAR | Status: AC
  Filled 2011-10-18: qty 2

## 2011-10-18 MED ORDER — MIDAZOLAM HCL 5 MG/5ML IJ SOLN
INTRAMUSCULAR | Status: AC | PRN
  Administered 2011-10-18 (×2): 1 mg via INTRAVENOUS

## 2011-10-18 MED ORDER — IOHEXOL 350 MG/ML SOLN
5.0000 mL | Freq: Once | INTRAVENOUS | Status: AC | PRN
  Administered 2011-10-18: 5 mL

## 2011-10-18 MED ORDER — LIDOCAINE HCL 1 % IJ SOLN
INTRAMUSCULAR | Status: AC
  Filled 2011-10-18: qty 20

## 2011-10-18 MED ORDER — HEPARIN SOD (PORK) LOCK FLUSH 100 UNIT/ML IV SOLN
INTRAVENOUS | Status: AC
  Filled 2011-10-18: qty 5

## 2011-10-18 MED ORDER — CEFAZOLIN SODIUM 1-5 GM-% IV SOLN
INTRAVENOUS | Status: AC
  Filled 2011-10-18: qty 50

## 2011-10-18 MED ORDER — MIDAZOLAM HCL 2 MG/2ML IJ SOLN
INTRAMUSCULAR | Status: AC
  Filled 2011-10-18: qty 6

## 2011-10-18 MED ORDER — CEFAZOLIN SODIUM 1-5 GM-% IV SOLN
1.0000 g | INTRAVENOUS | Status: AC
  Administered 2011-10-18: 1 g via INTRAVENOUS

## 2011-10-18 MED ORDER — FENTANYL CITRATE 0.05 MG/ML IJ SOLN
INTRAMUSCULAR | Status: AC
  Filled 2011-10-18: qty 4

## 2011-10-18 MED ORDER — HEPARIN SOD (PORK) LOCK FLUSH 100 UNIT/ML IV SOLN
500.0000 [IU] | INTRAVENOUS | Status: AC | PRN
  Administered 2011-10-18: 500 [IU]

## 2011-10-18 MED ORDER — ENSURE PLUS PO LIQD: ORAL | Status: DC

## 2011-10-18 MED ORDER — FENTANYL CITRATE 0.05 MG/ML IJ SOLN
INTRAMUSCULAR | Status: AC | PRN
  Administered 2011-10-18 (×2): 50 ug via INTRAVENOUS

## 2011-10-18 NOTE — Progress Notes (Signed)
Shane Marsh is status post PEG placement.   NUTRITION DIAGNOSIS:  Food and nutrition knowledge deficit related to patient receiving new PEG as evidenced by no prior need for nutrition related information.  Diagnosis of unintended weight loss continues.  INTERVENTION:   I have educated him and Shane Marsh on initiating bolus tube feedings using Ensure Plus after tube has been approved for usage.  The patient will begin with 1/2 can of Ensure Plus q.i.d. at 8 a.m., 12 noon, 4 p.m. and 8 p.m. and will flush with 120 mL of free water before and after each bolus feeding.  If tolerated, the patient will advance to 1 can q.i.d. on day 3 with the same free water flushes.  If tolerated the patient will advance to 1-1/2 cans q.i.d. on day 5.  In addition to free water flushes with bolus feedings the patient is to consume 240 mL of water by mouth or via tube. This will provide 2100 calories, 78 g protein and a total of 2280 mL of free water.  This will meet the patient's minimum caloric needs.  We will need to assess protein intake and protein supplements to increase overall protein intake if the patient is unable to consume oral diet.  MONITORING/EVALUATION/GOALS:  The patient will tolerate tube feedings and oral intake to meet minimum estimated needs to promote weight maintenance.  NEXT VISIT:  There is no followup scheduled.  However, I have given him 2 contact numbers to call if he has any problems with his feedings. Someone will attempt to contact him for TF follow up. ______________________________ Zenovia Jarred, RD, CSO, LDN Clinical Nutrition Specialist BN/MEDQ  D:  10/18/2011  T:  10/18/2011  Job:  [id number]

## 2011-10-18 NOTE — Progress Notes (Signed)
Barbara in from [facility] speaking to patient and wife about G-Tube and feedings.

## 2011-10-18 NOTE — H&P (Signed)
Shane Marsh is an 61 y.o. male.   Chief Complaint: "I'm here for a stomach tube" HPI: Patient with history of recurrent tonsillar cancer, dysphagia and protein calorie malnutrition presents today for percutaneous gastrostomy tube placement.  Past Medical History  Diagnosis Date  . Hypertension   . Cancer     nasal and throat  . GERD (gastroesophageal reflux disease) 10/15/2011  . Protein calorie malnutrition 10/15/2011    Past Surgical History  Procedure Date  . Splectomy   . Tonsillectomy     one side    No family history on file. Social History:  reports that he has never smoked. He does not have any smokeless tobacco history on file. He reports that he does not drink alcohol. His drug history not on file.  Allergies:  Allergies  Allergen Reactions  . Morphine And Related Nausea And Vomiting   Current outpatient prescriptions:ALPRAZolam (XANAX) 0.25 MG tablet, Take 1 tablet (0.25 mg total) by mouth 2 (two) times daily as needed for anxiety., Disp: 30 tablet, Rfl: 0;  dexamethasone (DECADRON) 4 MG tablet, Take 4 mg by mouth 2 (two) times daily with a meal., Disp: , Rfl: ;  diphenhydrAMINE (BENADRYL) 25 MG tablet, Take 25-50 mg by mouth daily as needed. For allergy, Disp: , Rfl:  enoxaparin (LOVENOX) 100 MG/ML injection, Inject 1 mL (100 mg total) into the skin daily., Disp: 30 Syringe, Rfl: 1;  escitalopram (LEXAPRO) 5 MG tablet, Take 5 mg by mouth every morning., Disp: , Rfl: ;  fludrocortisone (FLORINEF) 0.1 MG tablet, Take 1 tablet (0.1 mg total) by mouth daily., Disp: 30 tablet, Rfl: 0;  HYDROcodone-acetaminophen (NORCO) 7.5-325 MG per tablet, Take 1 tablet by mouth every 6 (six) hours as needed. For pain, Disp: , Rfl:  midodrine (PROAMATINE) 5 MG tablet, Take 1 tablet (5 mg total) by mouth 3 (three) times daily with meals., Disp: 90 tablet, Rfl: 0;  omeprazole (PRILOSEC) 20 MG capsule, Take 1 capsule (20 mg total) by mouth daily., Disp: 30 capsule, Rfl: 2;  ondansetron (ZOFRAN) 8 MG  tablet, Take 1 tablet (8 mg total) by mouth 2 (two) times daily as needed (Nausea or vomiting)., Disp: 30 tablet, Rfl: 1 prochlorperazine (COMPAZINE) 10 MG tablet, Take 1 tablet (10 mg total) by mouth every 6 (six) hours as needed (Nausea or vomiting)., Disp: 30 tablet, Rfl: 1;  Alum & Mag Hydroxide-Simeth (MAGIC MOUTHWASH W/LIDOCAINE) SOLN, Take 5 mLs by mouth 4 (four) times daily. Swish and spit before meals and at bedtime., Disp: 500 mL, Rfl: 2;  sucralfate (CARAFATE) 1 GM/10ML suspension, Take 10 mLs (1 g total) by mouth 4 (four) times daily., Disp: 420 mL, Rfl: 2 Current facility-administered medications:0.9 %  sodium chloride infusion, , Intravenous, Continuous, D Kevin Sameeha Rockefeller, PA;  ceFAZolin (ANCEF) 1-5 GM-% IVPB, , , , ;  ceFAZolin (ANCEF) IVPB 1 g/50 mL premix, 1 g, Intravenous, On Call, D Jeananne Rama, PA Facility-Administered Medications Ordered in Other Encounters: fentaNYL (SUBLIMAZE) 0.05 MG/ML injection, , , , ;  fentaNYL (SUBLIMAZE) 0.05 MG/ML injection, , , , ;  lidocaine (XYLOCAINE) 1 % (with pres) injection, , , , ;  midazolam (VERSED) 2 MG/2ML injection, , , ,     Results for orders placed during the hospital encounter of 10/18/11 (from the past 48 hour(s))  APTT     Status: Normal   Collection Time   10/18/11 10:17 AM      Component Value Range Comment   aPTT 27  24 - 37 seconds  CBC     Status: Abnormal   Collection Time   10/18/11 10:17 AM      Component Value Range Comment   WBC 38.7 (*) 4.0 - 10.5 K/uL    RBC 3.78 (*) 4.22 - 5.81 MIL/uL    Hemoglobin 11.7 (*) 13.0 - 17.0 g/dL    HCT 40.9 (*) 81.1 - 52.0 %    MCV 88.9  78.0 - 100.0 fL    MCH 31.0  26.0 - 34.0 pg    MCHC 34.8  30.0 - 36.0 g/dL    RDW 91.4 (*) 78.2 - 15.5 %    Platelets 240  150 - 400 K/uL   PROTIME-INR     Status: Abnormal   Collection Time   10/18/11 10:17 AM      Component Value Range Comment   Prothrombin Time 16.2 (*) 11.6 - 15.2 seconds    INR 1.27  0.00 - 1.49    No results found.  Review  of Systems  Constitutional: Positive for weight loss and malaise/fatigue. Negative for fever and chills.  HENT:       Occ nosebleeds  Respiratory: Positive for cough. Negative for shortness of breath.   Cardiovascular: Negative for chest pain.  Gastrointestinal: Positive for nausea. Negative for vomiting.       Occ "burning " sensation in abdomen  Musculoskeletal: Negative for back pain.  Neurological: Negative for headaches.    Blood pressure 110/75, pulse 69, temperature 98.8 F (37.1 C), temperature source Oral, resp. rate 16, height 5\' 8"  (1.727 m), weight 139 lb (63.05 kg), SpO2 92.00%. Physical Exam  Constitutional: He is oriented to person, place, and time. No distress.  Cardiovascular: Normal rate and regular rhythm.   Respiratory: Effort normal and breath sounds normal.  GI: Soft. Bowel sounds are normal. There is no tenderness.  Musculoskeletal: Normal range of motion.       Trace bilat LE edema  Neurological: He is alert and oriented to person, place, and time.     Assessment/Plan Patient with history of recurrent tonsillar cancer, dysphagia and protein calorie malnutrition; plan is for percutaneous gastrostomy tube placement. Details/risks of procedure d/w pt /wife with their understanding and consent.  Terril Chestnut,D KEVIN 10/18/2011, 11:17 AM

## 2011-10-18 NOTE — Procedures (Signed)
Placement of percutaneous gastrostomy tube.  No immediate complication.

## 2011-10-21 ENCOUNTER — Telehealth: Payer: Self-pay | Admitting: *Deleted

## 2011-10-21 NOTE — Telephone Encounter (Signed)
Wife called to ask if it is safe to resume the Lovenox since pt had PEG tube put 3 days ago on 7/19.  Instructed pt to resume Lovenox for history of DVTs,,   Dr. Gaylyn Rong did not d/c the lovenox and pt should resume it as directed.  She verbalized understanding.

## 2011-10-23 ENCOUNTER — Other Ambulatory Visit (HOSPITAL_BASED_OUTPATIENT_CLINIC_OR_DEPARTMENT_OTHER): Payer: 59 | Admitting: Lab

## 2011-10-23 ENCOUNTER — Ambulatory Visit: Payer: 59 | Admitting: Nutrition

## 2011-10-23 ENCOUNTER — Encounter (HOSPITAL_COMMUNITY)
Admission: RE | Admit: 2011-10-23 | Discharge: 2011-10-23 | Disposition: A | Discharge status: Home / Self Care | Payer: 59 | Source: Ambulatory Visit | Attending: Oncology | Admitting: Oncology

## 2011-10-23 ENCOUNTER — Ambulatory Visit (HOSPITAL_BASED_OUTPATIENT_CLINIC_OR_DEPARTMENT_OTHER): Payer: 59

## 2011-10-23 ENCOUNTER — Ambulatory Visit (HOSPITAL_BASED_OUTPATIENT_CLINIC_OR_DEPARTMENT_OTHER): Payer: 59 | Admitting: Oncology

## 2011-10-23 ENCOUNTER — Encounter: Payer: Self-pay | Admitting: Dietician

## 2011-10-23 ENCOUNTER — Encounter: Payer: Self-pay | Admitting: Oncology

## 2011-10-23 ENCOUNTER — Telehealth: Payer: Self-pay | Admitting: *Deleted

## 2011-10-23 VITALS — BP 95/70 | HR 89 | Temp 97.0°F | Ht 68.0 in | Wt 137.0 lb

## 2011-10-23 DIAGNOSIS — I959 Hypotension, unspecified: Secondary | ICD-10-CM

## 2011-10-23 DIAGNOSIS — E46 Unspecified protein-calorie malnutrition: Secondary | ICD-10-CM

## 2011-10-23 DIAGNOSIS — D72829 Elevated white blood cell count, unspecified: Secondary | ICD-10-CM

## 2011-10-23 DIAGNOSIS — C119 Malignant neoplasm of nasopharynx, unspecified: Secondary | ICD-10-CM

## 2011-10-23 DIAGNOSIS — D689 Coagulation defect, unspecified: Secondary | ICD-10-CM

## 2011-10-23 DIAGNOSIS — Z9081 Acquired absence of spleen: Secondary | ICD-10-CM

## 2011-10-23 DIAGNOSIS — C111 Malignant neoplasm of posterior wall of nasopharynx: Secondary | ICD-10-CM

## 2011-10-23 DIAGNOSIS — R001 Bradycardia, unspecified: Secondary | ICD-10-CM

## 2011-10-23 DIAGNOSIS — R55 Syncope and collapse: Secondary | ICD-10-CM

## 2011-10-23 DIAGNOSIS — K219 Gastro-esophageal reflux disease without esophagitis: Secondary | ICD-10-CM

## 2011-10-23 DIAGNOSIS — I82409 Acute embolism and thrombosis of unspecified deep veins of unspecified lower extremity: Secondary | ICD-10-CM

## 2011-10-23 DIAGNOSIS — E860 Dehydration: Secondary | ICD-10-CM

## 2011-10-23 LAB — COMPREHENSIVE METABOLIC PANEL
ALT: 15 U/L (ref 0–53)
Albumin: 2.6 g/dL — ABNORMAL LOW (ref 3.5–5.2)
Alkaline Phosphatase: 115 U/L (ref 39–117)
CO2: 30 mEq/L (ref 19–32)
Glucose, Bld: 87 mg/dL (ref 70–99)
Potassium: 3.4 mEq/L — ABNORMAL LOW (ref 3.5–5.3)
Sodium: 137 mEq/L (ref 135–145)
Total Bilirubin: 0.4 mg/dL (ref 0.3–1.2)
Total Protein: 4.7 g/dL — ABNORMAL LOW (ref 6.0–8.3)

## 2011-10-23 LAB — CBC WITH DIFFERENTIAL/PLATELET
BASO%: 1.1 % (ref 0.0–2.0)
Eosinophils Absolute: 0 10*3/uL (ref 0.0–0.5)
LYMPH%: 2.2 % — ABNORMAL LOW (ref 14.0–49.0)
MCHC: 34 g/dL (ref 32.0–36.0)
MONO#: 0.2 10*3/uL (ref 0.1–0.9)
MONO%: 0.5 % (ref 0.0–14.0)
NEUT#: 48.7 10*3/uL — ABNORMAL HIGH (ref 1.5–6.5)
RBC: 3.79 10*6/uL — ABNORMAL LOW (ref 4.20–5.82)
RDW: 15.4 % — ABNORMAL HIGH (ref 11.0–14.6)
WBC: 50.6 10*3/uL (ref 4.0–10.3)

## 2011-10-23 MED ORDER — FLUDEOXYGLUCOSE F - 18 (FDG) INJECTION
17.6000 | Freq: Once | INTRAVENOUS | Status: AC | PRN
  Administered 2011-10-23: 17.6 via INTRAVENOUS

## 2011-10-23 MED ORDER — SODIUM CHLORIDE 0.9 % IV SOLN
INTRAVENOUS | Status: DC
  Administered 2011-10-23: 11:00:00 via INTRAVENOUS

## 2011-10-23 NOTE — Progress Notes (Signed)
Brief Out-patient Oncology Follow Up Nutrition Note   Reason: RN contacted RD to see patient for PEG tube feedings.   Spoke with Shane Marsh and his wife in the chemotherapy treatment room. She reported she has only been administering 1/2 can of Ensure Plus via PEG tube 4 times daily (2 cans total) with the 120 ml water flushes before and after each feeding. 2 cans of Ensure Plus daily provides 700 kcal and 26 grams of protein. Patient denies any problems with abdominal discomfort. Patient reported he is not taking any foods or liquids by mouth.   Wt Readings from Last 10 Encounters:   10/23/11  137 lb (62.143 kg)   10/18/11  139 lb (63.05 kg)   10/14/11  139 lb 6.4 oz (63.231 kg)   10/09/11  144 lb 1.6 oz (65.363 kg)   10/01/11  152 lb 3.2 oz (69.037 kg)   09/19/11  162 lb 11.2 oz (73.8 kg)   08/29/11  182 lb 12.2 oz (82.9 kg)    Weight down 7 lb from previous RD appointment on 7/10.   I have instructed the patient and his wife to administer 1 can of Ensure Plus at each feeding today with 120 ml water flushes before and after each feeding, with an additional 240 ml free water before bed. I have instructed her to start administering 1 and 1/2 cans of Ensure Plus 4 times daily at 8 am, 12 pm, 4 pm, and 8 pm starting tomorrow with the previously outlined water flushes. Tomorrow, July 25 patient should be at goal feedings of 6 cans daily, providing 2100 calories and 78 grams of protein. I have provided RD contact information and instructed patient and his wife to contact RD for questions or concerns.   RD available for nutrition needs.   Tifani Dack Devina  [phone]  

## 2011-10-23 NOTE — Patient Instructions (Addendum)
A.  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  B.  PET scan result from 10/23/2011.  Neck: There is marked interval decrease in size and metabolic  activity of left parapharyngeal mass. There is ill-defined  fullness at the site of the prior mass with SUV max = 4.0 compared  to SUV max = 16.4 on prior. No hypermetabolic nodes in the neck.  Chest: There are two foci of new hypermetabolic activity within  the right lung associated with a fine nodular branching with mild  metabolic activity (image 75 and image 105) in the right upper lobe  and right middle lobes respectively. There are additional foci of  nodularity on the left and right. These are felt to represent foci  of infection rather than metastatic pulmonary disease. There is no  hypermetabolic mediastinal lymph nodes. There is hypermetabolic  activity associate with the distal esophagus which likely represent  esophagitis.  Abdomen/Pelvis: There is interval enlargement of the splenule  measuring 4.5 x 5.9 cm compared to the 5.4 x 4.1 cm on prior. The  spleen is also increased in metabolic activity with SUV max = 5.5  compared to SUV max = 3.8. No hypermetabolic lymph nodes in the  abdomen or pelvis.  There is hypermetabolic active associated with the percutaneous  gastrostomy tube are in the gastric antrum which is felt to be  benign. Diffuse metabolic active associate the colon which is also  felt to be benign.  Skelton: No focal hypermetabolic activity to suggest skeletal  metastasis.   IMPRESSION:   1. Marked reduction in size and metabolic activity of left  parapharyngeal mass. Mild metabolic activity remains which may be  post treatment inflammation.  2. No hypermetabolic cervical lymph nodes.  3. Two foci of hypermetabolic activity within the right lung  associated with fine nodular branching is most consistent with foci   of pulmonary infection.  4. 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 This potentially could  be a chemotherapy response. Cannot exclude metastatic disease  although less favored.  C.  Treatment now: - Increase nutrition. - Make sure you get at least 2L of fluid a day (60-70oz). - I will arrange for PT/OT outpatient. - Follow up with Dr. Basilio Cairo to start concurrent chemoradiation within the next 3-4 weeks.  Belenda Cruise will see you again in about 2 weeks to make sure that you're on the right track of recovery.

## 2011-10-23 NOTE — Progress Notes (Signed)
[facility]  Telephone:(336) [phone] Fax:(336) [phone]   OFFICE PROGRESS NOTE   Cc:  [name], MD  DIAGNOSIS: Recurrent T2 N2b M0 tonsillar cancer.   PAST THERAPY: He underwent robotic resection with Dr. Hezzie Bump at [facility] in addition to left neck dissection. He was recommended to receive adjuvant radiation therapy. However, he declined at that time. He received his treatment in 2011.   CURRENT THERAPY: The patient was started on Taxotere, Cisplatin, and 5-FU on 09/13/11 x 2 cycles.   INTERVAL HISTORY: Shane Marsh 61 y.o. male returns for regular follow up with his wife.  He still has no appetite and continued to have weight loss.  He had a PEG tube placed last week; however, he has only used about 2 cans of Ensure daily via PEG.  He still has abnormal taste in the mouth.  He no longer has esophagitis/gastritis with abdominal pain.  He has been taking carafate.  He has severe weakness, deconditioning and needs help with all activities of daily living such as showering and getting dressed.    Patient denies fever, headache, visual changes, confusion, drenching night sweats, palpable lymph node swelling, mucositis, odynophagia, dysphagia, nausea vomiting, jaundice, chest pain, palpitation, shortness of breath, dyspnea on exertion, productive cough, gum bleeding, epistaxis, hematemesis, hemoptysis, abdominal pain, abdominal swelling, early satiety, melena, hematochezia, hematuria, skin rash, spontaneous bleeding, joint swelling, joint pain, heat or cold intolerance, bowel bladder incontinence, back pain, focal motor weakness, paresthesia, depression.      Past Medical History  Diagnosis Date  . Hypertension   . Cancer     nasal and throat  . GERD (gastroesophageal reflux disease) 10/15/2011  . Protein calorie malnutrition 10/15/2011    Past Surgical History  Procedure Date  . Splectomy   . Tonsillectomy     one side    Current Outpatient Prescriptions    Medication Sig Dispense Refill  . Alum & Mag Hydroxide-Simeth (MAGIC MOUTHWASH W/LIDOCAINE) SOLN Take 5 mLs by mouth 4 (four) times daily. Swish and spit before meals and at bedtime.  500 mL  2  . dexamethasone (DECADRON) 4 MG tablet Take 4 mg by mouth 3 (three) times daily.       . diphenhydrAMINE (BENADRYL) 25 MG tablet Take 25-50 mg by mouth daily as needed. For allergy      . enoxaparin (LOVENOX) 100 MG/ML injection Inject 1 mL (100 mg total) into the skin daily.  30 Syringe  1  . Ensure Plus (ENSURE PLUS) LIQD Begin Ensure Plus 1/2 can via PEG QID with 120 ml free water before and after each bolus feeding.  If tolerated increase to 1 can of Ensure Plus QID on day 3 and 4.  If tolerated, increase to goal of 1.5 cans QID with 120 ml free water before and after each bolus.  In addition, pt is to drink or put an additional 240 ml free water in PEG daily.  Please send supplies and RN to home no later than Saturday, July 20th.  1422 mL  0  . escitalopram (LEXAPRO) 5 MG tablet Take 5 mg by mouth every morning.      . ferrous sulfate 325 (65 FE) MG tablet Take 325 mg by mouth daily with breakfast.      . fludrocortisone (FLORINEF) 0.1 MG tablet Take 1 tablet (0.1 mg total) by mouth daily.  30 tablet  0  . midodrine (PROAMATINE) 10 MG tablet Take 5 mg by mouth 3 (three) times daily.      Marland Kitchen  omeprazole (PRILOSEC) 20 MG capsule Take 1 capsule (20 mg total) by mouth daily.  30 capsule  2  . ondansetron (ZOFRAN) 8 MG tablet Take 1 tablet (8 mg total) by mouth 2 (two) times daily as needed (Nausea or vomiting).  30 tablet  1  . prochlorperazine (COMPAZINE) 10 MG tablet Take 1 tablet (10 mg total) by mouth every 6 (six) hours as needed (Nausea or vomiting).  30 tablet  1   No current facility-administered medications for this visit.   Facility-Administered Medications Ordered in Other Visits  Medication Dose Route Frequency Provider Last Rate Last Dose  . 0.9 %  sodium chloride infusion   Intravenous  Continuous Exie Parody, MD 500 mL/hr at 10/23/11 1107    . fludeoxyglucose F - 18 (FDG) injection 17.6 milli Curie  17.6 milli Curie Intravenous Once PRN Medication Radiologist, MD   17.6 milli Curie at 10/23/11 0745    ALLERGIES:  is allergic to morphine and related.  REVIEW OF SYSTEMS:  The rest of the 14-point review of system was negative.   Filed Vitals:   10/23/11 0957  BP: 95/70  Pulse: 89  Temp: 97 F (36.1 C)   Wt Readings from Last 3 Encounters:  10/23/11 137 lb (62.143 kg)  10/23/11 137 lb (62.143 kg)  10/18/11 139 lb (63.05 kg)   ECOG Performance status: 2  PHYSICAL EXAMINATION:   General: cachectic-appearing man in no acute distress. Eyes: no scleral icterus. ENT: There were no oropharyngeal lesions on my unaided exam. Neck was without thyromegaly. There was no longer fullness in the left cervical neck. Lymphatics: Negative cervical, supraclavicular or axillary adenopathy. Respiratory: lungs were clear bilaterally without wheezing or crackles. Cardiovascular: Regular rate and rhythm, S1/S2, without murmur, rub or gallop. There was no pedal edema. GI: abdomen was soft, flat, nontender, nondistended, without organomegaly. Muscoloskeletal: no spinal tenderness of palpation of vertebral spine. Skin exam was without echymosis, petichae. Neuro exam showed left eye not able to adduct laterally. His tongue and uvula were no longer deviated.  His left eye still could not adduct.  Patient was in a wheelchair today; he needed help to get on exam table. Patient was alerted and oriented x 4. Attention was good. Language was appropriate. Mood was normal without depression. Speech was not pressured. Thought content was not tangential.    LABORATORY/RADIOLOGY DATA:  Lab Results  Component Value Date   WBC 50.6* 10/23/2011   HGB 11.7* 10/23/2011   HCT 34.5* 10/23/2011   PLT 227 10/23/2011   GLUCOSE 118* 10/14/2011   CHOL 229* 08/29/2011   TRIG 143 08/29/2011   HDL 34* 08/29/2011   LDLCALC  166* 08/29/2011   ALKPHOS 55 10/14/2011   ALT 26 10/14/2011   AST 8 10/14/2011   NA 134* 10/14/2011   K 4.2 10/14/2011   CL 95* 10/14/2011   CREATININE 1.05 10/14/2011   BUN 22 10/14/2011   CO2 25 10/14/2011   INR 1.27 10/18/2011   HGBA1C 5.9* 08/28/2011    Nm Pet Image Restag (ps) Skull Base To Thigh:  I personally reviewed the images and showed them to Shane Marsh and his wife.   10/23/2011  *RADIOLOGY REPORT*  Clinical Data: Subsequent  treatment strategy for nasopharyngeal carcinoma.  NUCLEAR MEDICINE PET SKULL BASE TO THIGH  Fasting Blood Glucose:  98  Technique:  17.6 mCi F-18 FDG was injected intravenously. CT data was obtained and used for attenuation correction and anatomic localization only.  (This was not acquired as a diagnostic  CT examination.) Additional exam technical data entered on technologist worksheet.  Comparison:  PET CT scan 09/04/2011, MRI head 08/29/2011  Findings:  Neck: There is marked interval decrease in size and metabolic activity of left parapharyngeal mass.  There is ill-defined fullness at the site of the prior mass with SUV max = 4.0 compared to SUV max = 16.4 on prior.  No hypermetabolic nodes in the neck.  Chest:  There are two foci of new hypermetabolic activity within the right lung associated with a fine nodular branching with mild metabolic activity (image 75 and image 105) in the right upper lobe and right middle lobes respectively. There are  additional foci of nodularity on the left and right. These are felt to represent foci of infection rather than metastatic pulmonary disease.  There is no hypermetabolic mediastinal lymph nodes.  There is hypermetabolic activity associate with the distal esophagus which likely represent esophagitis.  Abdomen/Pelvis:  There is interval enlargement of the splenule measuring 4.5 x 5.9 cm compared to the 5.4 x 4.1 cm on prior.  The spleen is also increased in metabolic activity with SUV max = 5.5 compared to SUV max = 3.8. No hypermetabolic  lymph nodes in the abdomen or pelvis.  There is hypermetabolic active associated with the percutaneous gastrostomy tube are in the gastric antrum which is felt to be benign.  Diffuse metabolic active associate the colon which is also felt to be benign.  Skelton:  No focal hypermetabolic activity to suggest skeletal metastasis.  IMPRESSION:  1.  Marked reduction in size and metabolic activity of left parapharyngeal mass.  Mild metabolic activity remains which may be post treatment inflammation. 2.  No hypermetabolic cervical lymph nodes. 3.  Two foci of hypermetabolic activity within the right lung associated with fine nodular branching is most consistent with foci of pulmonary infection. 4. 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  This potentially could be a chemotherapy response.  Cannot exclude metastatic disease although less favored.  Original Report Authenticated By: Genevive Bi, M.D.    ASSESSMENT AND PLAN:   1. 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 The patient is s/p 2 cycles of Taxotere, Cisplatin, and 5-FU. The patient has grade 2-3 fatigue, grade 2 anorexia/weight loss, grade 3 neutropenia, grade 1 thrombocytopenia, and grade 2-3 nausea. He had very good partial response to 2 cycles of induction chemo.  I recommended stopping induction chemo due to very poor toleration.  He is not in optimal performance status for concurrent definitive chemoradiation now.  However, given good response with induction chemo, he has good chance of response to concurrent chemoradiation.  Overall survival is another matter.  I personally believe that his cancer is recurrent tonsil cancer from before; and thus, the chance of cure in his case is still less than the  70-80% chance that patients with de novo disease.  However, the alternative of doing nothing is not advisable given high morbidity and mortality with future recurrence.  Mr. Talerico and his wife would like to think it over and follow up with Dr. Basilio Cairo.   2. Bilateral DVT. He remains on Lovenox 100 mg sq daily.   3. Severe protein calorie malnutrition. He is only on 2 cans of Ensure daily.  I advised him to increase this to at least 6 cans daily.  I hope that he can see nutrition today.   4. Severe deconditioning:  I referred him to [facility] for home PT/OT.   5. Leukocytosis:  Due to recent Neulasta.  There is no sign of infection.  He is afebrile.  Will continue to observe.   6. Hypotension. He is hypotensive today. He remains on Florinef and Proamatine. He will follow-up with Cardiology.   7. Bradycardia. Resolved.   8. Dehydration. He has poor oral intake. He will receive a liter of Normal Saline here again today.   9. Depression. He is on Lexapro 5 mg daily.   10. Follow-up. In about 2 weeks.     The length of time of the face-to-face encounter was 25  minutes. More than 50% of time was spent counseling and coordination of care.

## 2011-10-23 NOTE — Progress Notes (Signed)
Order for Home PHT and OT faxed to [facility] fax [phone].  Requested services to start w/i one week per Dr. Gaylyn Rong.

## 2011-10-23 NOTE — Treatment Plan (Signed)
[facility] END OF TREATMENT   Name: Kazim Corrales Date: 10/23/2011 MRN: [id number] DOB: [birth year]   TREATMENT DATES: 10/13/2011 to 10/04/2011.    REFERRING PHYSICIAN: Dr. Lonie Peak.   DIAGNOSIS:  Recurrent oropharynx cancer   STAGE AT START OF TREATMENT:  IVB   INTENT:  palliative   DRUGS OR REGIMENS GIVEN:  Induction chemo Taxotere/Cisplatin/5FU   MAJOR TOXICITIES: grade 2-3 fatigue, grade 2 anorexia/weight loss, grade 3 neutropenia, grade 1 thrombocytopenia, and grade 2-3 nausea    REASON TREATMENT STOPPED: poor tolerationg   PERFORMANCE STATUS AT END:  ECOG 2   ONGOING PROBLEMS:  Severe deconditioning, severe calorie/protein malnutrition.     FOLLOW UP PLANS:  Increase PEG tube feed; home PT/OT.  I will see him in about 2 weeks.

## 2011-10-23 NOTE — Progress Notes (Signed)
Brief Out-patient Oncology Follow Up Nutrition Note   Reason: RN contacted RD to see patient for PEG tube feedings.   Spoke with Shane Marsh and his wife in the chemotherapy treatment room. She reported she has only been administering 1/2 can of Ensure Plus via PEG tube 4 times daily (2 cans total) with the 120 ml water flushes before and after each feeding. 2 cans of Ensure Plus daily provides 700 kcal and 26 grams of protein. Patient denies any problems with abdominal discomfort. Patient reported he is not taking any foods or liquids by mouth.   Wt Readings from Last 10 Encounters:   10/23/11  137 lb (62.143 kg)   10/18/11  139 lb (63.05 kg)   10/14/11  139 lb 6.4 oz (63.231 kg)   10/09/11  144 lb 1.6 oz (65.363 kg)   10/01/11  152 lb 3.2 oz (69.037 kg)   09/19/11  162 lb 11.2 oz (73.8 kg)   08/29/11  182 lb 12.2 oz (82.9 kg)    Weight down 7 lb from previous RD appointment on 7/10.   I have instructed the patient and his wife to administer 1 can of Ensure Plus at each feeding today with 120 ml water flushes before and after each feeding, with an additional 240 ml free water before bed. I have instructed her to start administering 1 and 1/2 cans of Ensure Plus 4 times daily at 8 am, 12 pm, 4 pm, and 8 pm starting tomorrow with the previously outlined water flushes. Tomorrow, July 25 patient should be at goal feedings of 6 cans daily, providing 2100 calories and 78 grams of protein. I have provided RD contact information and instructed patient and his wife to contact RD for questions or concerns.   RD available for nutrition needs.   Iven Finn [facility]  [phone]

## 2011-10-23 NOTE — Telephone Encounter (Signed)
Gave patient appointment for 11-06-2011 starting at 10:15am printed out calendar and gave to the patient

## 2011-10-24 ENCOUNTER — Ambulatory Visit: Payer: Self-pay | Admitting: Oncology

## 2011-10-24 ENCOUNTER — Ambulatory Visit: Payer: Self-pay

## 2011-10-24 ENCOUNTER — Encounter: Payer: Self-pay | Admitting: Nutrition

## 2011-10-24 ENCOUNTER — Other Ambulatory Visit: Payer: Self-pay | Admitting: Lab

## 2011-10-25 ENCOUNTER — Ambulatory Visit: Payer: Self-pay

## 2011-10-28 ENCOUNTER — Other Ambulatory Visit: Payer: Self-pay | Admitting: Radiation Oncology

## 2011-10-28 ENCOUNTER — Telehealth: Payer: Self-pay | Admitting: Oncology

## 2011-10-28 NOTE — Telephone Encounter (Signed)
S/w pt today re r/s nutr appt per message from [facility]. Pt does not want to schedule a nutr appt. Message to [facility].

## 2011-10-29 DIAGNOSIS — I82403 Acute embolism and thrombosis of unspecified deep veins of lower extremity, bilateral: Secondary | ICD-10-CM

## 2011-10-29 HISTORY — DX: Acute embolism and thrombosis of unspecified deep veins of lower extremity, bilateral: I82.403

## 2011-10-30 ENCOUNTER — Encounter: Payer: Self-pay | Admitting: Radiation Oncology

## 2011-10-30 ENCOUNTER — Ambulatory Visit: Payer: Self-pay

## 2011-10-30 ENCOUNTER — Ambulatory Visit: Admission: RE | Admit: 2011-10-30 | Payer: 59 | Source: Ambulatory Visit | Admitting: Radiation Oncology

## 2011-10-30 ENCOUNTER — Ambulatory Visit: Payer: 59

## 2011-10-30 ENCOUNTER — Encounter: Payer: Self-pay | Admitting: Nutrition

## 2011-10-30 DIAGNOSIS — Z931 Gastrostomy status: Secondary | ICD-10-CM | POA: Insufficient documentation

## 2011-10-30 DIAGNOSIS — K1230 Oral mucositis (ulcerative), unspecified: Secondary | ICD-10-CM | POA: Insufficient documentation

## 2011-10-30 DIAGNOSIS — L988 Other specified disorders of the skin and subcutaneous tissue: Secondary | ICD-10-CM | POA: Insufficient documentation

## 2011-10-30 DIAGNOSIS — K121 Other forms of stomatitis: Secondary | ICD-10-CM | POA: Insufficient documentation

## 2011-10-30 DIAGNOSIS — C099 Malignant neoplasm of tonsil, unspecified: Secondary | ICD-10-CM | POA: Insufficient documentation

## 2011-10-30 DIAGNOSIS — Z79899 Other long term (current) drug therapy: Secondary | ICD-10-CM | POA: Insufficient documentation

## 2011-10-30 DIAGNOSIS — K209 Esophagitis, unspecified without bleeding: Secondary | ICD-10-CM | POA: Insufficient documentation

## 2011-10-30 DIAGNOSIS — Z7901 Long term (current) use of anticoagulants: Secondary | ICD-10-CM | POA: Insufficient documentation

## 2011-10-30 DIAGNOSIS — Z510 Encounter for antineoplastic radiation therapy: Secondary | ICD-10-CM | POA: Insufficient documentation

## 2011-10-30 HISTORY — DX: Personal history of antineoplastic chemotherapy: Z92.21

## 2011-10-31 HISTORY — PX: GASTROSTOMY TUBE PLACEMENT: SHX655

## 2011-11-01 ENCOUNTER — Ambulatory Visit: Payer: 59 | Admitting: Radiation Oncology

## 2011-11-01 ENCOUNTER — Ambulatory Visit: Payer: 59

## 2011-11-01 ENCOUNTER — Encounter: Payer: Self-pay | Admitting: *Deleted

## 2011-11-01 NOTE — Progress Notes (Signed)
Fax received from [facility] states pt does not want HH OT at this time.

## 2011-11-04 ENCOUNTER — Encounter: Payer: Self-pay | Admitting: Radiation Oncology

## 2011-11-04 ENCOUNTER — Ambulatory Visit
Admission: RE | Admit: 2011-11-04 | Discharge: 2011-11-04 | Disposition: A | Discharge status: Home / Self Care | Payer: 59 | Source: Ambulatory Visit | Attending: Radiation Oncology | Admitting: Radiation Oncology

## 2011-11-04 VITALS — BP 109/74 | HR 89 | Temp 97.6°F | Wt 147.4 lb

## 2011-11-04 DIAGNOSIS — C119 Malignant neoplasm of nasopharynx, unspecified: Secondary | ICD-10-CM

## 2011-11-04 NOTE — Progress Notes (Signed)
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 There is concern that he may have been affected by peritumoral swelling in response to radiotherapy and we decided to switch his treatment to induction chemotherapy. He received 2 cycles of Taxotere cis-platinum and 5-FU.   Subsequent PET scan showed an outstanding response described below. There is some residual hypermetabolic activity in the nasopharynx which could be post treatment inflammation. The patient lost an extraordinary amount of weight. He comes today using a walker. ECoG performance status is a 3. He has a PEG tube due to recent issues with poor appetite, weight loss, and malnutrition. He is taking about 6 cans a day. His appetite is improving, however. He is snacking through the day. The patient is adamant that he does not want any more treatment which can cause any side effects. His wife is supportive of his wishes. He has followup later this week in medical oncology.  ALLERGIES:  is allergic to morphine and related.  Meds: Current Outpatient Prescriptions  Medication Sig Dispense Refill  . Alum & Mag Hydroxide-Simeth (MAGIC MOUTHWASH W/LIDOCAINE) SOLN Take 5 mLs by mouth 4 (four) times daily. Swish and spit before meals and at bedtime.  500 mL  2  . dexamethasone (DECADRON) 4 MG tablet Take 4 mg by mouth 3 (three) times daily.       . diphenhydrAMINE (BENADRYL) 25 MG tablet Take 25-50 mg by mouth daily as needed. For allergy      . enoxaparin (LOVENOX) 100 MG/ML injection Inject 1 mL (100 mg total) into the skin daily.  30  Syringe  1  . Ensure Plus (ENSURE PLUS) LIQD Begin Ensure Plus 1/2 can via PEG QID with 120 ml free water before and after each bolus feeding.  If tolerated increase to 1 can of Ensure Plus QID on day 3 and 4.  If tolerated, increase to goal of 1.5 cans QID with 120 ml free water before and after each bolus.  In addition, pt is to drink or put an additional 240 ml free water in PEG daily.  Please send supplies and RN to home no later than Saturday, July 20th.  1422 mL  0  . escitalopram (LEXAPRO) 5 MG tablet Take 5 mg by mouth every morning.      . ferrous sulfate 325 (65 FE) MG tablet Take 325 mg by mouth daily with breakfast.      . fludrocortisone (FLORINEF) 0.1 MG tablet Take 1 tablet (0.1 mg total) by mouth daily.  30 tablet  0  . midodrine (PROAMATINE) 10 MG tablet Take 5 mg by mouth 3 (three) times daily.      Marland Kitchen omeprazole (PRILOSEC) 20 MG capsule Take 1 capsule (20 mg total) by mouth daily.  30 capsule  2  . ondansetron (ZOFRAN) 8 MG tablet Take 1 tablet (8 mg total) by mouth 2 (two) times daily as needed (Nausea or vomiting).  30 tablet  1  . prochlorperazine (COMPAZINE) 10 MG tablet Take 1 tablet (10 mg total) by mouth every 6 (six) hours as needed (Nausea or  vomiting).  30 tablet  1    Physical Findings: The patient is in no acute distress. Patient is alert and oriented. Blood pressure is 109/74, pulse 89, temperature 97.6, weight 147.4 pounds.  2 months ago, he weighed 182 pounds.  The patient's tongue is more midline than my prior examination before chemotherapy. However, his left eye will not abduct. He has left ptosis. No obvious lesions in his oropharynx. His neck is supple with no palpable lymphadenopathy at this time.  Lab Findings: Lab Results  Component Value Date   WBC 50.6* 10/23/2011   HGB 11.7* 10/23/2011   HCT 34.5* 10/23/2011   MCV 91.1 10/23/2011   PLT 227 10/23/2011    CMP     Component Value Date/Time   NA 137 10/23/2011 0942   K 3.4* 10/23/2011 0942   CL 99  10/23/2011 0942   CO2 30 10/23/2011 0942   GLUCOSE 87 10/23/2011 0942   BUN 15 10/23/2011 0942   CREATININE 0.67 10/23/2011 0942   CALCIUM 8.2* 10/23/2011 0942   PROT 4.7* 10/23/2011 0942   ALBUMIN 2.6* 10/23/2011 0942   AST 15 10/23/2011 0942   ALT 15 10/23/2011 0942   ALKPHOS 115 10/23/2011 0942   BILITOT 0.4 10/23/2011 0942   GFRNONAA >90 09/20/2011 0443   GFRAA >90 09/20/2011 0443      Radiographic Findings: Ir Gastrostomy Tube Mod Sed  10/18/2011  *RADIOLOGY REPORT*  Clinical history:62year-old with nasopharyngeal carcinoma and unable to eat.  PROCEDURE(S): PERCUTANEOUS GASTROSTOMY TUBE WITH FLUOROSCOPIC GUIDANCE  Physician: Rachelle Hora. Lowella Dandy, MD  Medications:Versed 2.00mg , Fentanyl 100 mcg, Ancef 1 gram  As antibiotic prophylaxis, Ancef  was ordered pre-procedure and administered intravenously within one hour of incision.  Moderate sedation time:20 minutes  Fluoroscopy time: 4.9 minutes  Procedure:Informed consent was obtained for a percutaneous gastrostomy tube.  The patient was placed on the interventional table.  Fluoroscopy demonstrated oral contrast in the transverse colon.  An orogastric tube was placed with fluoroscopic guidance. The anterior abdomen was prepped and draped in sterile fashion. Maximal barrier sterile technique was utilized including caps, mask, sterile gowns, sterile gloves, sterile drape, hand hygiene and skin antiseptic.  Stomach was inflated with air through the orogastric tube.  The skin and subcutaneous tissues were anesthetized with 1% lidocaine.  A 17 gauge needle was directed into the distended stomach with fluoroscopic guidance.  A wire was advanced into the stomach and a T-tact was deployed.  A 9-French vascular sheath was placed and the orogastric tube was snared using a Gooseneck snare device.  The orogastric tube and snare were pulled out of the patient's mouth.  The snare device was connected to a 20-French gastrostomy tube.  The snare device and gastrostomy tube were  pulled through the patient's mouth and out the anterior abdominal wall.  The gastrostomy tube was cut to an appropriate length.  Contrast injection through gastrostomy tube confirmed placement within the stomach.  Fluoroscopic images were obtained for documentation.  The gastrostomy tube was flushed with normal saline.  Findings:Gastrostomy tube within the stomach.  Complications: None  Impression:Successful fluoroscopic guided percutaneous gastrostomy tube placement.  Original Report Authenticated By: Richarda Overlie, M.D.   Nm Pet Image Restag (ps) Skull Base To Thigh  10/23/2011  *RADIOLOGY REPORT*  Clinical Data: Subsequent  treatment strategy for nasopharyngeal carcinoma.  NUCLEAR MEDICINE PET SKULL BASE TO THIGH  Fasting Blood Glucose:  98  Technique:  17.6 mCi F-18 FDG was injected intravenously. CT data was obtained and used for attenuation correction  and anatomic localization only.  (This was not acquired as a diagnostic CT examination.) Additional exam technical data entered on technologist worksheet.  Comparison:  PET CT scan 09/04/2011, MRI head 08/29/2011  Findings:  Neck: There is marked interval decrease in size and metabolic activity of left parapharyngeal mass.  There is ill-defined fullness at the site of the prior mass with SUV max = 4.0 compared to SUV max = 16.4 on prior.  No hypermetabolic nodes in the neck.  Chest:  There are two foci of new hypermetabolic activity within the right lung associated with a fine nodular branching with mild metabolic activity (image 75 and image 105) in the right upper lobe and right middle lobes respectively. There are  additional foci of nodularity on the left and right. These are felt to represent foci of infection rather than metastatic pulmonary disease.  There is no hypermetabolic mediastinal lymph nodes.  There is hypermetabolic activity associate with the distal esophagus which likely represent esophagitis.  Abdomen/Pelvis:  There is interval enlargement of  the splenule measuring 4.5 x 5.9 cm compared to the 5.4 x 4.1 cm on prior.  The spleen is also increased in metabolic activity with SUV max = 5.5 compared to SUV max = 3.8. No hypermetabolic lymph nodes in the abdomen or pelvis.  There is hypermetabolic active associated with the percutaneous gastrostomy tube are in the gastric antrum which is felt to be benign.  Diffuse metabolic active associate the colon which is also felt to be benign.  [place]:  No focal hypermetabolic activity to suggest skeletal metastasis.  IMPRESSION:  1.  Marked reduction in size and metabolic activity of left parapharyngeal mass.  Mild metabolic activity remains which may be post treatment inflammation. 2.  No hypermetabolic cervical lymph nodes. 3.  Two foci of hypermetabolic activity within the right lung associated with fine nodular branching is most consistent with foci of pulmonary infection. 4. 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  This potentially could be a chemotherapy response.  Cannot exclude metastatic disease although less favored.  Original Report Authenticated By: Genevive Bi, M.D.    Impression: The patient tolerated 2 cycles of induction chemotherapy with great difficulty. His ECoG performance status of 3 currently. He has gained his appetite back in starting the again. Nevertheless, he is adamant that he does not want to undergo any further chemotherapy or radiotherapy. The patient understands his prognosis to be grave. He has been quoted a cure rate of 20% by medical oncology. I told him that it could be better than that if his performance status continues to improve, but that it is still guarded.  Plan: I offered to refer the patient back to [facility] to be presented at tumor board for a second opinion. I told him that radiotherapy (with chemotherapy, if his performance status allows ) will be his only realistic chance of cure, but he  understands this would come with significant side effects. He reports that he does not want to endure any further side effects and does not want a second opinion. He is thinking about hospice. He will discuss this further with medical oncology later in the week. I told the patient that I would like to continue to follow him and we talked about some situations in which palliative radiotherapy could be pursued over a shortened course to a smaller amount of tissue than curative treatment would entail.  I will see him back in 3 months to  verify how he is doing at that time and to see as to whether he will need any palliative treatments. The patient knows he can call if he needs to be worked into my schedule earlier than that. _____________________________________   Lonie Peak, MD

## 2011-11-06 ENCOUNTER — Ambulatory Visit (HOSPITAL_BASED_OUTPATIENT_CLINIC_OR_DEPARTMENT_OTHER): Payer: 59 | Admitting: Oncology

## 2011-11-06 ENCOUNTER — Telehealth: Payer: Self-pay | Admitting: *Deleted

## 2011-11-06 ENCOUNTER — Encounter: Payer: Self-pay | Admitting: Oncology

## 2011-11-06 ENCOUNTER — Other Ambulatory Visit (HOSPITAL_BASED_OUTPATIENT_CLINIC_OR_DEPARTMENT_OTHER): Payer: 59 | Admitting: Lab

## 2011-11-06 VITALS — BP 103/72 | HR 84 | Temp 97.8°F | Resp 20 | Ht 68.0 in | Wt 148.5 lb

## 2011-11-06 DIAGNOSIS — C119 Malignant neoplasm of nasopharynx, unspecified: Secondary | ICD-10-CM

## 2011-11-06 DIAGNOSIS — C7800 Secondary malignant neoplasm of unspecified lung: Secondary | ICD-10-CM

## 2011-11-06 DIAGNOSIS — C110 Malignant neoplasm of superior wall of nasopharynx: Secondary | ICD-10-CM

## 2011-11-06 DIAGNOSIS — I82409 Acute embolism and thrombosis of unspecified deep veins of unspecified lower extremity: Secondary | ICD-10-CM

## 2011-11-06 LAB — CBC WITH DIFFERENTIAL/PLATELET
Basophils Absolute: 0.1 10*3/uL (ref 0.0–0.1)
Eosinophils Absolute: 0.1 10*3/uL (ref 0.0–0.5)
HCT: 32.8 % — ABNORMAL LOW (ref 38.4–49.9)
MCH: 31.5 pg (ref 27.2–33.4)
MCHC: 32.7 g/dL (ref 32.0–36.0)
MONO#: 1.2 10*3/uL — ABNORMAL HIGH (ref 0.1–0.9)
Platelets: 318 10*3/uL (ref 140–400)
RBC: 3.41 10*6/uL — ABNORMAL LOW (ref 4.20–5.82)
WBC: 21.8 10*3/uL — ABNORMAL HIGH (ref 4.0–10.3)

## 2011-11-06 LAB — COMPREHENSIVE METABOLIC PANEL
Alkaline Phosphatase: 70 U/L (ref 39–117)
BUN: 15 mg/dL (ref 6–23)
CO2: 26 mEq/L (ref 19–32)
Glucose, Bld: 85 mg/dL (ref 70–99)
Total Bilirubin: 0.3 mg/dL (ref 0.3–1.2)

## 2011-11-06 MED ORDER — WARFARIN SODIUM 5 MG PO TABS: 5.0000 mg | ORAL_TABLET | Freq: Every day | ORAL | Status: DC

## 2011-11-06 MED ORDER — HYDROCODONE-ACETAMINOPHEN 7.5-325 MG PO TABS: 1.0000 | ORAL_TABLET | Freq: Four times a day (QID) | ORAL | Status: AC | PRN

## 2011-11-06 NOTE — Telephone Encounter (Signed)
Gave patient appointment for 11-11-2011 11-25-2011 12-03-2011 12-09-2011 12-16-2011 lab and md

## 2011-11-06 NOTE — Progress Notes (Signed)
[facility]  Telephone:([phone] [phone]   OFFICE PROGRESS NOTE   Cc:  [name], MD  DIAGNOSIS: Recurrent T2 N2b M0 tonsillar cancer.   PAST THERAPY:  1. He underwent robotic resection with Dr. Hezzie Bump at [facility] in addition to left neck dissection. He was recommended to receive adjuvant radiation therapy. However, he declined at that time. He received his treatment in 2011.  2. The patient was started on Taxotere, Cisplatin, and 5-FU on 09/13/11 x 2 cycles.   CURRENT THERAPY: He is under consideration for concurrent chemoradiation therapy versus radiation therapy alone.  INTERVAL HISTORY: Shane Marsh 61 y.o. male returns for regular follow up with his wife.  He states he is getting stronger. Appetite is better and he has gained 11 lbs. He is eating soft foods and uses 6 cans of Ensure via PEG daily. No redness or drainage around PEG site. Taste is improving. Reports mild headache mostly in the evening. Uses Hydrocodone for this. No chest pain. No syncopal episodes. No shortness of breath. No nausea or vomiting.   Patient denies fever, headache, visual changes, confusion, drenching night sweats, palpable lymph node swelling, mucositis, odynophagia, dysphagia, nausea vomiting, jaundice, chest pain, palpitation, shortness of breath, dyspnea on exertion, productive cough, gum bleeding, epistaxis, hematemesis, hemoptysis, abdominal pain, abdominal swelling, early satiety, melena, hematochezia, hematuria, skin rash, spontaneous bleeding, joint swelling, joint pain, heat or cold intolerance, bowel bladder incontinence, back pain, focal motor weakness, paresthesia, depression.    Past Medical History  Diagnosis Date  . Hypertension   . Cancer     nasal and throat  . GERD (gastroesophageal reflux disease) 10/15/2011  . Protein calorie malnutrition 10/15/2011  . Status post chemotherapy 10/04/11-10/13/11    taxotere/cisplatin/5FU    Past Surgical History    Procedure Date  . Splectomy   . Tonsillectomy     one side    Current Outpatient Prescriptions  Medication Sig Dispense Refill  . Alum & Mag Hydroxide-Simeth (MAGIC MOUTHWASH W/LIDOCAINE) SOLN Take 5 mLs by mouth 4 (four) times daily. Swish and spit before meals and at bedtime.  500 mL  2  . dexamethasone (DECADRON) 4 MG tablet Take 4 mg by mouth 3 (three) times daily.       . diphenhydrAMINE (BENADRYL) 25 MG tablet Take 25-50 mg by mouth daily as needed. For allergy      . enoxaparin (LOVENOX) 100 MG/ML injection Inject 1 mL (100 mg total) into the skin daily.  30 Syringe  1  . Ensure Plus (ENSURE PLUS) LIQD Begin Ensure Plus 1/2 can via PEG QID with 120 ml free water before and after each bolus feeding.  If tolerated increase to 1 can of Ensure Plus QID on day 3 and 4.  If tolerated, increase to goal of 1.5 cans QID with 120 ml free water before and after each bolus.  In addition, pt is to drink or put an additional 240 ml free water in PEG daily.  Please send supplies and RN to home no later than Saturday, July 20th.  1422 mL  0  . escitalopram (LEXAPRO) 5 MG tablet Take 5 mg by mouth every morning.      . ferrous sulfate 325 (65 FE) MG tablet Take 325 mg by mouth daily with breakfast.      . fludrocortisone (FLORINEF) 0.1 MG tablet Take 1 tablet (0.1 mg total) by mouth daily.  30 tablet  0  . HYDROcodone-acetaminophen (NORCO) 7.5-325 MG per tablet Take 1 tablet  by mouth every 6 (six) hours as needed for pain.  60 tablet  0  . midodrine (PROAMATINE) 10 MG tablet Take 5 mg by mouth 3 (three) times daily.      Marland Kitchen omeprazole (PRILOSEC) 20 MG capsule Take 1 capsule (20 mg total) by mouth daily.  30 capsule  2  . ondansetron (ZOFRAN) 8 MG tablet Take 1 tablet (8 mg total) by mouth 2 (two) times daily as needed (Nausea or vomiting).  30 tablet  1  . prochlorperazine (COMPAZINE) 10 MG tablet Take 1 tablet (10 mg total) by mouth every 6 (six) hours as needed (Nausea or vomiting).  30 tablet  1  .  warfarin (COUMADIN) 5 MG tablet Take 1 tablet (5 mg total) by mouth daily.  60 tablet  1    ALLERGIES:  is allergic to morphine and related.  REVIEW OF SYSTEMS:  The rest of the 14-point review of system was negative.   Filed Vitals:   11/06/11 1041  BP: 103/72  Pulse: 84  Temp: 97.8 F (36.6 C)  Resp: 20   Wt Readings from Last 3 Encounters:  11/06/11 148 lb 8 oz (67.359 kg)  11/04/11 147 lb 6.4 oz (66.86 kg)  10/23/11 137 lb (62.143 kg)   ECOG Performance status: 2  PHYSICAL EXAMINATION:   General: cachectic-appearing man in no acute distress. Eyes: no scleral icterus. ENT: There were no oropharyngeal lesions on my unaided exam. Neck was without thyromegaly. There was no longer fullness in the left cervical neck. Lymphatics: Negative cervical, supraclavicular or axillary adenopathy. Respiratory: lungs were clear bilaterally without wheezing or crackles. Cardiovascular: Regular rate and rhythm, S1/S2, without murmur, rub or gallop. There was no pedal edema. GI: abdomen was soft, flat, nontender, nondistended, without organomegaly. Muscoloskeletal: no spinal tenderness of palpation of vertebral spine. Skin exam was without echymosis, petichae. Neuro exam showed left eye not able to adduct laterally. His tongue and uvula were no longer deviated.  His left eye still could not adduct.  Patient using a walker today; able to get on the exam table without difficulty. Patient was alerted and oriented x 4. Attention was good. Language was appropriate. Mood was normal without depression. Speech was not pressured. Thought content was not tangential.    LABORATORY/RADIOLOGY DATA:  Lab Results  Component Value Date   WBC 21.8* 11/06/2011   HGB 10.7* 11/06/2011   HCT 32.8* 11/06/2011   PLT 318 11/06/2011   GLUCOSE 87 10/23/2011   CHOL 229* 08/29/2011   TRIG 143 08/29/2011   HDL 34* 08/29/2011   LDLCALC 166* 08/29/2011   ALKPHOS 115 10/23/2011   ALT 15 10/23/2011   AST 15 10/23/2011   NA 137 10/23/2011     K 3.4* 10/23/2011   CL 99 10/23/2011   CREATININE 0.67 10/23/2011   BUN 15 10/23/2011   CO2 30 10/23/2011   INR 1.27 10/18/2011   HGBA1C 5.9* 08/28/2011    ASSESSMENT AND PLAN:   1. 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 The patient is s/p 2 cycles of Taxotere, Cisplatin, and 5-FU. The patient has grade 2-3 fatigue, grade 2 anorexia/weight loss, grade 3 neutropenia, grade 1 thrombocytopenia, and grade 2-3 nausea. He had very good partial response to 2 cycles of induction chemo.  His fatigue has improved. Weight is improving. No further nausea or vomiting. He is off induction chemotherapy.  Performance status is improving.  He had a good response with induction chemo, he has good chance of response to concurrent chemoradiation.  Overall survival is another matter.  I personally believe that his cancer is recurrent tonsil cancer from before; and thus, the chance of cure in his case is still less than the 70-80% chance that patients with de novo disease.  However, the alternative of doing nothing is not advisable given high morbidity and mortality with future recurrence.  The patient is really unsure about whether he wishes to resume treatment with chemoradiation therapy versus radiation alone. The patient will think about this some more and will let us know at his next visit.  2. Bilateral DVT. Will switch to Coumadin. He was given a prescription for Coumadin 5 mg daily. He will remain on Lovenox 100 mg sq daily until INR is at least 2.0. INR scheduled for 11/11/11 and weekly thereafter.   3. Severe protein calorie malnutrition. Taking Ensure 6 cans per day. Weight is up and he is eating more soft foods. Followed by Vernell Leep.   4. Severe deconditioning: He has been referred to  [facility] for home [facility].    5. Leukocytosis:  Due to recent Neulasta.  There is no sign of infection.  He is afebrile.  Will continue to observe.   6. Hypotension. BP normotensive. He remains on Florinef and Proamatine. He will follow-up with Cardiology.   7. Bradycardia. Resolved.   8. Depression. He is on Lexapro 5 mg daily.   9. Social issues. The patient expressed concern about financial issues and loss of health insurance in the near future. I have referred him to SW. He was directed to the Financial Counselor today to look into applying for Medicaid.  10. Follow-up. In about 6 weeks.     The length of time of the face-to-face encounter was 40 minutes. More than 50% of time was spent counseling and coordination of care.

## 2011-11-08 NOTE — Addendum Note (Signed)
Encounter addended by: Delynn Flavin, RN on: 11/08/2011 12:34 PM<BR>     Documentation filed: Charges VN

## 2011-11-11 ENCOUNTER — Telehealth: Payer: Self-pay

## 2011-11-11 ENCOUNTER — Other Ambulatory Visit (HOSPITAL_BASED_OUTPATIENT_CLINIC_OR_DEPARTMENT_OTHER): Payer: 59 | Admitting: Lab

## 2011-11-11 ENCOUNTER — Encounter: Payer: Self-pay | Admitting: Oncology

## 2011-11-11 ENCOUNTER — Telehealth: Payer: Self-pay | Admitting: *Deleted

## 2011-11-11 DIAGNOSIS — C119 Malignant neoplasm of nasopharynx, unspecified: Secondary | ICD-10-CM

## 2011-11-11 LAB — PROTIME-INR
INR: 1.4 — ABNORMAL LOW (ref 2.00–3.50)
Protime: 16.8 Seconds — ABNORMAL HIGH (ref 10.6–13.4)

## 2011-11-11 NOTE — Progress Notes (Signed)
In-Home Hospice referral to [facility] [phone]) , per Clenton Pare, NP; patient will discuss whether continue weekly lab draws @ [facility] or at home with Hospice care.

## 2011-11-11 NOTE — Progress Notes (Signed)
Patient stop by my office this morning with his wife and they had some questions about the patient insurance.The patient said that his insurance will be ending soon because he is no longer working.I did give him a Medicaid application to fill out  And return to the department of social service in [place],he also said that he will need help food and his bills.The patient also mention that his long term disability should be kicking in soon he hope so,I told the patient when he return his medicaid application to DSS he can also apply for food stamps and I told him that I did not know about them helping with bills,but I told him to ask.I also told him that if his insurance term to let me know so that we can do an epp application to help with his medication.I also gave him [facility] psych[employer] card .

## 2011-11-11 NOTE — Telephone Encounter (Signed)
Shane Marsh called to obtain information in order for Hospice to go see pt. today.  Patient's insurance information and H&P need to be faxed to [phone].

## 2011-11-11 NOTE — Telephone Encounter (Signed)
Patient was seen initially in the hospital by Dr. Gaylyn Rong.  Initial consultation note and insurance information faxed to [employer] with [facility].

## 2011-11-11 NOTE — Telephone Encounter (Signed)
Message copied by Kallie Locks on Mon Nov 11, 2011  3:01 PM ------      Message from: Clenton Pare R      Created: Mon Nov 11, 2011  1:09 PM       Please call patient and wife. His INR is 1.4 today. He needs to continue to take BOTH Lovenox and Coumadin. Coumadin should continue at 5 mg daily. Plan to recheck INR next Monday as scheduled. When INR is 2.0 or higher, will stop the Lovenox.            Thanks

## 2011-11-18 ENCOUNTER — Telehealth: Payer: Self-pay | Admitting: *Deleted

## 2011-11-18 ENCOUNTER — Telehealth: Payer: Self-pay | Admitting: Oncology

## 2011-11-18 ENCOUNTER — Other Ambulatory Visit (HOSPITAL_BASED_OUTPATIENT_CLINIC_OR_DEPARTMENT_OTHER): Payer: 59 | Admitting: Lab

## 2011-11-18 DIAGNOSIS — I82409 Acute embolism and thrombosis of unspecified deep veins of unspecified lower extremity: Secondary | ICD-10-CM

## 2011-11-18 DIAGNOSIS — C119 Malignant neoplasm of nasopharynx, unspecified: Secondary | ICD-10-CM

## 2011-11-18 LAB — PROTIME-INR: INR: 1.7 — ABNORMAL LOW (ref 2.00–3.50)

## 2011-11-18 NOTE — Telephone Encounter (Signed)
Please let Shane Marsh know that chemo is given along with radiation, not by itself.  He needs to make an appointment to see Dr. Basilio Cairo within the next week so that simulation CT can be performed and chemoradiation can be started within 3 weeks or so.  By then, he should be much stronger to get chemoradiation.    I will see him before he starts chemorad (maybe before 12/16/11) depending on when Dr. Basilio Cairo is ready for him.   Thanks.

## 2011-11-18 NOTE — Telephone Encounter (Signed)
Pt came by and talked about restarting chemo,note sent to [facility].

## 2011-11-18 NOTE — Telephone Encounter (Signed)
Called pt w/ instructions to continue Lovenox and to alternate his Coumadin 7.5 mg start tonight w/ 5 mg every other day per Clenton Pare, NP.Marland Kitchen  Pt verbalized understanding.    Offered to give order to Hospice nurse to check INR at home so he does not need to come in for lab only visits.  Pt states he is feeling stronger and he does not know if he will stay on Hospice.  Hospice is due to visit tomorrow, but pt says he wants to resume chemotherapy treatments.   He understands he may not be eligible for Hospice any more if he resumes chemo treatment.  He will continue to come in to [facility] for INR checks weekly as scheduled and keep his appt w/ Dr. Gaylyn Rong on 12/16/11.   He wants Dr. Gaylyn Rong to be aware that he wants to resume chemo as he is feeling stronger.

## 2011-11-18 NOTE — Telephone Encounter (Signed)
Message copied by Wende Mott on Mon Nov 18, 2011  1:36 PM ------      Message from: Clenton Pare R      Created: Mon Nov 18, 2011 12:31 PM       Please call patient. INR is still low. Change Coumadin to 7.5 mg alternating with 5 mg. Continue Lovenox.            He was referred to Hospice last week. Does he want to have Hospice draw labs then fax results to [place] so that he does not need to drive down here?            Thanks

## 2011-11-19 ENCOUNTER — Other Ambulatory Visit: Payer: Self-pay | Admitting: Oncology

## 2011-11-20 ENCOUNTER — Telehealth: Payer: Self-pay | Admitting: *Deleted

## 2011-11-20 NOTE — Telephone Encounter (Signed)
Called pt to instruct to make appt to see Dr. Basilio Cairo w/i next week to plan Radiation per Dr. Gaylyn Rong.  Informed pt that Dr. Gaylyn Rong said pt should hopefully be strong enough to start chemo/radiation in about 3 weeks.  Asked pt if he has informed Hospice of his intent to resume chemo and he said he has.Shane Marsh  He verbalized understanding to contact Dr. Colletta [place] office for appoint.

## 2011-11-25 ENCOUNTER — Other Ambulatory Visit (HOSPITAL_BASED_OUTPATIENT_CLINIC_OR_DEPARTMENT_OTHER): Payer: 59 | Admitting: Lab

## 2011-11-25 ENCOUNTER — Telehealth: Payer: Self-pay | Admitting: *Deleted

## 2011-11-25 DIAGNOSIS — C119 Malignant neoplasm of nasopharynx, unspecified: Secondary | ICD-10-CM

## 2011-11-25 NOTE — Telephone Encounter (Signed)
Called pt's home and spoke w/ his wife, Cordelia Pen.  Gave her instructions to stop lovenox and continue Coumadin 7.5 mg alternate w/ 5 mg qod.  Keep next lab appt on 9/04 as scheduled.  She verbalized understanding.

## 2011-11-25 NOTE — Telephone Encounter (Signed)
Message copied by Wende Mott on Mon Nov 25, 2011  1:49 PM ------      Message from: Clenton Pare R      Created: Mon Nov 25, 2011 11:47 AM       Please call pt and let him know that he may stop his Lovenox injections. Continue current dose of Coumadin (looks like he should be on 5 mg alternating with 7.5 mg). Will recheck again next week as scheduled.            Thanks

## 2011-11-29 ENCOUNTER — Ambulatory Visit
Admission: RE | Admit: 2011-11-29 | Discharge: 2011-11-29 | Disposition: A | Discharge status: Home / Self Care | Payer: 59 | Source: Ambulatory Visit | Attending: Radiation Oncology | Admitting: Radiation Oncology

## 2011-11-29 ENCOUNTER — Encounter: Payer: Self-pay | Admitting: Radiation Oncology

## 2011-11-29 VITALS — BP 112/76 | HR 73 | Temp 98.3°F | Resp 18 | Ht 67.0 in | Wt 166.3 lb

## 2011-11-29 DIAGNOSIS — C119 Malignant neoplasm of nasopharynx, unspecified: Secondary | ICD-10-CM

## 2011-11-29 HISTORY — DX: Personal history of other diseases of the circulatory system: Z86.79

## 2011-11-29 HISTORY — DX: Personal history of irradiation: Z92.3

## 2011-11-29 HISTORY — DX: Major depressive disorder, single episode, unspecified: F32.9

## 2011-11-29 HISTORY — DX: Gastrostomy status: Z93.1

## 2011-11-29 HISTORY — DX: Depression, unspecified: F32.A

## 2011-11-29 HISTORY — DX: Acute embolism and thrombosis of unspecified deep veins of lower extremity, bilateral: I82.403

## 2011-11-29 NOTE — Progress Notes (Signed)
Patient presents to the clinic today accompanied by his wife for a re consult with Dr. Basilio Cairo to discuss the role of radiation therapy in the treatment of recurrent nasopharynx ca. Patient is alert and oriented to person, place, and time. No distress noted. Slow steady gait noted with assistance of cane. Pleasant affect noted. Patient denies pain at this time. 11/06/11 patient weighed in at 148.8 but, today is up to 166.4 lb. Patient has gained in less than a month 17.6 lb. Hospice is on hold because the patient has decided to resume chemotherapy treatment. Patient denies nausea, vomiting, headache, dizziness, and diarrhea. Patient reports regular formed daily bowel movements. Patient reports he was unable to get Medicaid assistance but, reports that his work has provided him with COBRA insurance to last for the next 18 months. Patient taking decadron 4 mg tablet once per day. Patient reports that he stopped taking the Lovenox injections on Sunday but, continues the coumadin. Patient reports taking in 2-3 cans of ensure plus per day. Patient reports that he was taking in six cans via peg tube but his appetite has increase. He reports eating and drinking more by mouth. Patient denies cough but, reports he must clear his throat frequent. Patient denies sores or ulcerations of the mouth. Patient denies esophagitis. Patient reports sleeping without difficulty. Patient denies shortness of breath. Patient reports double vision. Patient reports that the pupil of the left eye was completely turned in toward his nose but, he feels like it has move slightly toward the center. Patient reports occasional floaters.  Patient reports that his "energy level has shot up through the roof." patient reports that he has been feeling so good its almost scary."

## 2011-11-29 NOTE — Progress Notes (Signed)
See progress noted under physician encounter.  

## 2011-11-29 NOTE — Addendum Note (Signed)
Encounter addended by: Lonie Peak, MD on: 11/29/2011  6:11 PM<BR>     Documentation filed: Notes Section, Flowsheet VN, Inpatient Notes, Orders

## 2011-11-29 NOTE — Progress Notes (Addendum)
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 He has regained his strength and would like to start chemoradiation. He is putting hospice on hold. He is gaining weight. He has gained 17 pounds over the past few weeks.   He denies nausea vomiting headache dizziness nor diarrhea. He reports that his bowel movements are daily and regularly formed. He is taking Decadron 4 mg once per day. He continues Coumadin. He is using his PEG tube but also has gained in appetite and he is eating. He denies any esophagitis. He denies any sores in his mouth. Energy is excellent. Able to move (abduct) his left eye better than before.                           ALLERGIES:  is allergic to morphine and related.  Meds: Current Outpatient Prescriptions  Medication Sig Dispense Refill  . Alum & Mag Hydroxide-Simeth (MAGIC MOUTHWASH W/LIDOCAINE) SOLN Take 5 mLs by mouth 4 (four) times daily. Swish and spit before meals and at bedtime.  500 mL  2  . CARAFATE 1 GM/10ML suspension       . dexamethasone (DECADRON) 4 MG tablet Take 4 mg by mouth 3 (three) times daily.       . diphenhydrAMINE (BENADRYL) 25 MG tablet Take 25-50 mg by mouth daily as needed. For allergy      . Ensure Plus (ENSURE PLUS) LIQD Begin Ensure Plus 1/2 can via PEG QID with 120 ml free water before and after each bolus feeding.  If tolerated increase to 1 can of Ensure Plus QID on day 3 and 4.  If tolerated, increase to goal of 1.5 cans QID with 120 ml free water before and after each bolus.  In addition, pt is to drink or put an additional 240 ml free water in PEG daily.  Please send supplies and RN to home no later than Saturday, July 20th.  1422 mL  0  .  escitalopram (LEXAPRO) 5 MG tablet Take 5 mg by mouth every morning.      . ferrous sulfate 325 (65 FE) MG tablet Take 325 mg by mouth daily with breakfast.      . fludrocortisone (FLORINEF) 0.1 MG tablet Take 1 tablet (0.1 mg total) by mouth daily.  30 tablet  0  . midodrine (PROAMATINE) 10 MG tablet Take 5 mg by mouth 3 (three) times daily.      Marland Kitchen omeprazole (PRILOSEC) 20 MG capsule Take 1 capsule (20 mg total) by mouth daily.  30 capsule  2  . warfarin (COUMADIN) 5 MG tablet Take 1 tablet (5 mg total) by mouth daily.  60 tablet  1  . enoxaparin (LOVENOX) 100 MG/ML injection Inject 1 mL (100 mg total) into the skin daily.  30 Syringe  1  . HYDROcodone-acetaminophen (NORCO) 7.5-325 MG per tablet       . ondansetron (ZOFRAN) 8 MG tablet Take 1 tablet (8 mg total) by mouth 2 (two) times daily as needed (Nausea or vomiting).  30 tablet  1  . prochlorperazine (COMPAZINE) 10 MG tablet Take 1 tablet (10 mg total) by mouth every 6 (six) hours  as needed (Nausea or vomiting).  30 tablet  1    Physical Findings: The patient is in no acute distress. Patient is alert and oriented.  height is 5\' 7"  (1.702 m) and weight is 166 lb 4.8 oz (75.433 kg). His oral temperature is 98.3 F (36.8 C). His blood pressure is 112/76 and his pulse is 73. His respiration is 18 and oxygen saturation is 100%. .  No significant changes. No oral pharyngeal thrush. He is now able to partially abduct his left eye which is a new improvement. He no longer demonstrates any left ptosis. His tongue mildly points to the left. Neck demonstrates no palpable lymphadenopathy. He has ecchymoses over his neck skin and upper chest. Heart regular in rate and rhythm. Chest clear to auscultation bilaterally. Subjectively, he reports some numbness in his left fingers and toes.  Lab Findings: Lab Results  Component Value Date   WBC 21.8* 11/06/2011   HGB 10.7* 11/06/2011   HCT 32.8* 11/06/2011   MCV 96.3 11/06/2011   PLT 318 11/06/2011    Radiographic  Findings: No results found.  Impression/plan: The patient is doing well. He has gained significant weight and energy. He is now motivated to commence with chemoradiation. I support his decision.   I had a lengthy discussion with the patient and his wife and reviewed the risks benefits and side effects of radiotherapy to the head and neck region. We've discussed these before at [facility] before the patient commenced his radiotherapy there. He only received 2 fractions there before stopping treatment and moving on to neoadjuvant chemotherapy. I will arrange for his records to transfer to [facility].  The patient and his wife understand that they will take on significant side effects with this treatment. He'll receive treatment for approximately 7 weeks; however, if he has trouble tolerating it, treatment break can be provided. However, the patient understands that this is not ideal as it can interfere with the efficacy of treatment.  A consent form has been signed and placed in his chart. No guarantees of treatment were given. I will simulate him next Wednesday, September 4 and anticipate starting his radiotherapy on September 16.  I will make referrals to nutrition, social work, swallowing therapy as well. _____________________________________   Lonie Peak, MD

## 2011-11-29 NOTE — Addendum Note (Signed)
Encounter addended by: Abdoulaye Drum Mintz Angila Wombles, RN on: 11/29/2011  5:45 PM<BR>     Documentation filed: Charges VN

## 2011-11-29 NOTE — Progress Notes (Signed)
Complete PATIENT MEASURE OF DISTRESS worksheet with a score of 0 submitted to social work.  

## 2011-12-03 ENCOUNTER — Other Ambulatory Visit: Payer: Self-pay | Admitting: Lab

## 2011-12-04 ENCOUNTER — Ambulatory Visit
Admission: RE | Admit: 2011-12-04 | Discharge: 2011-12-04 | Disposition: A | Discharge status: Home / Self Care | Payer: 59 | Source: Ambulatory Visit | Attending: Radiation Oncology | Admitting: Radiation Oncology

## 2011-12-04 ENCOUNTER — Encounter: Payer: Self-pay | Admitting: Oncology

## 2011-12-04 ENCOUNTER — Other Ambulatory Visit: Payer: Self-pay | Admitting: Oncology

## 2011-12-04 ENCOUNTER — Other Ambulatory Visit (HOSPITAL_BASED_OUTPATIENT_CLINIC_OR_DEPARTMENT_OTHER): Payer: 59 | Admitting: Lab

## 2011-12-04 DIAGNOSIS — I82409 Acute embolism and thrombosis of unspecified deep veins of unspecified lower extremity: Secondary | ICD-10-CM

## 2011-12-04 DIAGNOSIS — C099 Malignant neoplasm of tonsil, unspecified: Secondary | ICD-10-CM

## 2011-12-04 DIAGNOSIS — C119 Malignant neoplasm of nasopharynx, unspecified: Secondary | ICD-10-CM

## 2011-12-04 DIAGNOSIS — E46 Unspecified protein-calorie malnutrition: Secondary | ICD-10-CM

## 2011-12-04 DIAGNOSIS — Z5181 Encounter for therapeutic drug level monitoring: Secondary | ICD-10-CM

## 2011-12-04 DIAGNOSIS — Z7901 Long term (current) use of anticoagulants: Secondary | ICD-10-CM

## 2011-12-04 HISTORY — DX: Malignant neoplasm of tonsil, unspecified: C09.9

## 2011-12-04 LAB — PROTIME-INR: Protime: 45.6 Seconds — ABNORMAL HIGH (ref 10.6–13.4)

## 2011-12-04 MED ORDER — SODIUM CHLORIDE 0.9 % IJ SOLN
10.0000 mL | Freq: Once | INTRAMUSCULAR | Status: AC
  Administered 2011-12-04: 10 mL via INTRAVENOUS

## 2011-12-04 NOTE — Progress Notes (Signed)
Simulation, IMRT treatment planning, and Special treatment procedure note   Diagnosis: head and neck cancer, tonsil cancer recurrent in nasopharynx  The patient was taken to the CT simulator and laid in the supine position on the table. An Aquaplast head and shoulder mask was custom fitted to his anatomy. High-resolution CT axial imaging was obtained of the head and neck with contrast. I verified that the quality of the imaging is good for treatment planning.   Treatment planning note I plan to treat the patient with helical Tomotherapy, IMRT. I plan to treat the patient's nasopharynx/oropharynx and bilateral neck nodes. I plan to treat to at least 66 Gy /33 fractions  - he previously received 4 Gy/2 fractions in [place], [place].  IMRT planning Note  IMRT is an important modality to deliver adequate dose to the patient's at risk tissues while sparing the patient's normal structures, including the: Parotid tissue, mandible, brain stem, spinal cord, oral cavity, brachial plexus .  This justifies the use of IMRT in the patient's treatment.   Special Treatment Procedure Note:  The patient will be receiving chemotherapy concurrently. Chemotherapy heightens the risk of side effects. I have considered this during the patient's treatment planning process and will monitor the patient accordingly for side effects on a weekly basis. Concurrent chemotherapy increases the complexity of this patient's treatment and therefore this constitutes a special treatment procedure.  -----------------------------------  Lonie Peak, MD

## 2011-12-04 NOTE — Progress Notes (Unsigned)
Called patient with PT/INR results; told to hold Coumadin dosage X 2 days, then restart Coumadin 5mg  daily; patient verbalized understanding.

## 2011-12-04 NOTE — Progress Notes (Signed)
Mr Degrace here for simulation appointment.  Explained the purpose of simulation is to acurrately pinpoint the area that needs to be treated and that we are using contrast dye via an IV to increase the accuracy of the planning.  Both stated understanding.  Asked in the presence of his spouse if he is a diabetic or if he has had any reactions to contrast dye in the past when during any any x-rays scans or Cat scans.  He and spouse stated no to both questions.  IV started in the ventral surface of his right arm with a 22 gauge angiocath.  Brisk blood return and site flushed x 2 without difficulty.  3 inc extension added.  No voiced concerns.

## 2011-12-04 NOTE — Progress Notes (Signed)
12/04/2011 1516 Removed right forearm 22 gauge IV. Catheter intact upon removal. Held pressure to site until bleeding stopped. Applied an occlusive dressing to the site. Patient tolerated well. Discharged patient to wife in radiation waiting area.

## 2011-12-04 NOTE — Progress Notes (Signed)
I spoke with patient today and he advised he has Cobra pending. He also has an appt with me on Monday 12/16/2011 to go over RO billing, EPP and MCD application process.

## 2011-12-05 ENCOUNTER — Encounter: Payer: Self-pay | Admitting: *Deleted

## 2011-12-05 ENCOUNTER — Telehealth: Payer: Self-pay | Admitting: Oncology

## 2011-12-05 ENCOUNTER — Telehealth: Payer: Self-pay | Admitting: *Deleted

## 2011-12-05 NOTE — Telephone Encounter (Signed)
S/w the pt and he is aware of his sept appts and to pick up a schedule whrn he comes in for that appt

## 2011-12-05 NOTE — Telephone Encounter (Signed)
Per staff message and POF I have scheduled appts.  JMW  

## 2011-12-05 NOTE — Progress Notes (Signed)
[facility]  Clinical Social Work  Clinical Social Work met with patient's spouse during [facility] appointment.  Mrs. Mangual states she feels patient's attitude has improved significantly since finishing his last round of chemoradiation and the patient has been able to gain his strength.  Mrs. Goldring states that the patient has remained active and they have spent quality time at home and with family.  Mrs. Salguero expressed her biggest fears and concerns related to treatment.  She states she remains hopeful, but is weary of treatment.  Mrs. Decou shared that she often "holds in her feelings" because she fears they will upset her spouse.  CSW encouraged Mrs. Malizia to continue to meet with CSW at [facility] and discussed simple communications skills that may facilitate positive discussion among Mr. & Mrs. Leard.   CSW and patient's spouse again discussed "quality of life" concept and ways that we can better prepare for his upcoming treatment.  CSW re-emphasized the importance of self-care as a caregiver.  CSW will continue to follow patient and spouse throughout treatment to provide emotional support and further assessment.  Kathrin Penner, MSW, [facility] Clinical Social Worker [facility] [phone]

## 2011-12-09 ENCOUNTER — Ambulatory Visit
Admission: RE | Admit: 2011-12-09 | Discharge: 2011-12-09 | Disposition: A | Discharge status: Home / Self Care | Payer: 59 | Source: Ambulatory Visit | Attending: Oncology | Admitting: Oncology

## 2011-12-09 ENCOUNTER — Telehealth: Payer: Self-pay | Admitting: *Deleted

## 2011-12-09 ENCOUNTER — Telehealth: Payer: Self-pay | Admitting: Radiation Oncology

## 2011-12-09 ENCOUNTER — Other Ambulatory Visit (HOSPITAL_BASED_OUTPATIENT_CLINIC_OR_DEPARTMENT_OTHER): Payer: 59 | Admitting: Lab

## 2011-12-09 DIAGNOSIS — I82409 Acute embolism and thrombosis of unspecified deep veins of unspecified lower extremity: Secondary | ICD-10-CM

## 2011-12-09 DIAGNOSIS — C119 Malignant neoplasm of nasopharynx, unspecified: Secondary | ICD-10-CM

## 2011-12-09 LAB — PROTIME-INR
INR: 1.3 — ABNORMAL LOW (ref 2.00–3.50)
Protime: 15.6 Seconds — ABNORMAL HIGH (ref 10.6–13.4)

## 2011-12-09 NOTE — Telephone Encounter (Signed)
Message copied by Sherre Poot on Mon Dec 09, 2011  3:36 PM ------      Message from: Su Hilt C      Created: Mon Dec 09, 2011  3:25 PM                   ----- Message -----         From: Myrtis Ser, NP         Sent: 12/09/2011   2:43 PM           To: Marlowe Aschoff, RN            Cameo            Call pt and tell him to take Coumadin 7.5 mg on Tues and Thus and 5 mg all other days. He is scheduled back this Friday with Dr Gaylyn Rong and we will recheck at that time.            Thanks

## 2011-12-09 NOTE — Telephone Encounter (Signed)
Patient's wife aware to dose Coumadin as suggested by Clenton Pare, DNP (notes attached).  7.5mg  Tues/Thurs and 5mg  all other days.  Wife was able to reiterate dosing via teachback method on how patient is supposed to take Coumadin.

## 2011-12-09 NOTE — Telephone Encounter (Signed)
Met with patient today due to his [employer] coverage term'd 11/30/2011. Pt says he has elected COBRA, but it is still pending at this point.  Pt has been APPROVED 45-40% EPP, as well as, MCD, ACS  and [employer] application is being completed for him to bring back. Family Size: 2  HH INC: 26,912.95  MOD POV: 26,367.00-26,948.63 Valid Dates: 12/09/2011 - 06/07/2012   [facility] $400.00

## 2011-12-12 NOTE — Patient Instructions (Addendum)
A.   1.  Diagnosis:  Recurrent head/neck cancer. 2.  Impression:  2 cycles of neoadjuvant chemotherapy (without radiation) with very good response. 3.  Recommendation:  Proceed with concurrent weekly chemo Carboplatin/Taxol and daily radiation.  Side effecfs Carboplatin andTaxol include but not limited to mouth sore, low blood count, infection, infusion reaction, numbness and tingling, muscle pain, bone pain, lung inflammation.   B.  History of blood clot:  Your INR today is therapeutic.  Continue Coumadin at current dose.  No change.   C.  Supportive care:  *  Nutrition:  Feeding tube; take oral as much as you can.  *  Mouth rinse:    Treatment for head and neck cancer will cause mouth sore.  However, if you are vigilant about mouth rinse, you can delay the onset and the severity of mouth sore.   Here's how:    -Salt/baking soda:  1 teaspoon of salt + 1 teaspoon of baking soda:  Mixed in 1 Liter of clean water.  Take about 3 oz, swish/gargle/ and spit about 3-4 times a day after meals.    - Once you start having thick phlegm because of treatment effects on the salivary glands, you need to do another additional mouth rinse in addition to salt/baking soda to clear out the thick phlegm to avoid gagging.      -One such over the counter method is diluted hydrogen peroxide.  Only make enough for one time use.  Mix about 3 oz of hydrogen peroxide with 12 oz of water (1:4 dilution).  Some patients may feel some burning sensation in the mouth with this, you can try the following.     -1:1 mixture of prescription 2% viscous Lidocaine and over the counter Robitussin.

## 2011-12-13 ENCOUNTER — Telehealth: Payer: Self-pay | Admitting: Oncology

## 2011-12-13 ENCOUNTER — Ambulatory Visit (HOSPITAL_BASED_OUTPATIENT_CLINIC_OR_DEPARTMENT_OTHER): Payer: 59 | Admitting: Oncology

## 2011-12-13 ENCOUNTER — Other Ambulatory Visit (HOSPITAL_BASED_OUTPATIENT_CLINIC_OR_DEPARTMENT_OTHER): Payer: 59

## 2011-12-13 VITALS — BP 117/75 | HR 82 | Temp 98.1°F | Resp 20 | Ht 67.0 in | Wt 177.0 lb

## 2011-12-13 DIAGNOSIS — I82409 Acute embolism and thrombosis of unspecified deep veins of unspecified lower extremity: Secondary | ICD-10-CM

## 2011-12-13 DIAGNOSIS — E46 Unspecified protein-calorie malnutrition: Secondary | ICD-10-CM

## 2011-12-13 DIAGNOSIS — F329 Major depressive disorder, single episode, unspecified: Secondary | ICD-10-CM

## 2011-12-13 DIAGNOSIS — D72829 Elevated white blood cell count, unspecified: Secondary | ICD-10-CM

## 2011-12-13 DIAGNOSIS — C099 Malignant neoplasm of tonsil, unspecified: Secondary | ICD-10-CM

## 2011-12-13 LAB — COMPREHENSIVE METABOLIC PANEL (CC13)
ALT: 22 U/L (ref 0–55)
BUN: 28 mg/dL — ABNORMAL HIGH (ref 7.0–26.0)
CO2: 20 mEq/L — ABNORMAL LOW (ref 22–29)
Calcium: 9 mg/dL (ref 8.4–10.4)
Chloride: 110 mEq/L — ABNORMAL HIGH (ref 98–107)
Creatinine: 0.8 mg/dL (ref 0.7–1.3)
Total Bilirubin: 0.5 mg/dL (ref 0.20–1.20)

## 2011-12-13 LAB — CBC WITH DIFFERENTIAL/PLATELET
BASO%: 0.2 % (ref 0.0–2.0)
Basophils Absolute: 0 10*3/uL (ref 0.0–0.1)
EOS%: 0.3 % (ref 0.0–7.0)
HCT: 39.1 % (ref 38.4–49.9)
HGB: 13.2 g/dL (ref 13.0–17.1)
LYMPH%: 3.3 % — ABNORMAL LOW (ref 14.0–49.0)
MCH: 33.9 pg — ABNORMAL HIGH (ref 27.2–33.4)
MCHC: 33.8 g/dL (ref 32.0–36.0)
MONO#: 0.5 10*3/uL (ref 0.1–0.9)
NEUT%: 93.1 % — ABNORMAL HIGH (ref 39.0–75.0)
Platelets: 247 10*3/uL (ref 140–400)
WBC: 15.4 10*3/uL — ABNORMAL HIGH (ref 4.0–10.3)

## 2011-12-13 LAB — PROTIME-INR
INR: 2 (ref 2.00–3.50)
Protime: 24 Seconds — ABNORMAL HIGH (ref 10.6–13.4)

## 2011-12-13 MED ORDER — DEXAMETHASONE 2 MG PO TABS: 2.0000 mg | ORAL_TABLET | Freq: Two times a day (BID) | ORAL | Status: AC

## 2011-12-13 MED ORDER — ESCITALOPRAM OXALATE 5 MG PO TABS: 5.0000 mg | ORAL_TABLET | Freq: Every morning | ORAL | Status: DC

## 2011-12-13 MED ORDER — FLUDROCORTISONE ACETATE 0.1 MG PO TABS: 0.1000 mg | ORAL_TABLET | Freq: Every day | ORAL | Status: DC

## 2011-12-13 NOTE — Telephone Encounter (Signed)
Gave pt appt for labs, ML and MD for September 2013, emailed Marcelino Duster regarding chemo

## 2011-12-14 NOTE — Progress Notes (Signed)
[facility]  Telephone:(336) [phone] Fax:(336) [phone]   OFFICE PROGRESS NOTE   Cc:  [name], MD   DIAGNOSIS: Recurrent T2 N2b M0 tonsillar cancer.   PAST THERAPY: He underwent robotic resection with Dr. Hezzie Bump at [facility] in addition to left neck dissection. He was recommended to receive adjuvant radiation therapy. However, he declined at that time. He received his treatment in 2011.   He developed recurrent disease in May 2013. He received 2 cycles of induction chemo Taxotere, Cisplatin, and 5-FU in June and July 2013.  He had very good partial response but grade 3 toxicity with anorexia, weight loss and decided to stop therapy.   CURRENT THERAPY: watchful observation.   INTERVAL HISTORY: Shane Marsh 61 y.o. male returns for regular follow up. He has decided to resume therapy with concurrent chemorad.  Over the last 4 weeks, his strength, appetite, weight have significantly improved being off of induction chemo.  He was able to walk about 2 miles everyday.  He has mild SOB at the end of each walk; however, he is very functional.  He is independent of activities of daily living.  He no longer needs PEG tube.  He is eating and drinking by mouth. He denied mucositis, nausea/vomiting, hearing loss, neuropathy, nausea/vomiting, CP, palpitation.  He still has left eye ptosis and diplopia.  However, he no longer has syncope.     Past Medical History  Diagnosis Date  . Hypertension   . 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 GERD (gastroesophageal reflux disease) 10/15/2011  . Protein calorie malnutrition 10/15/2011  . Status post chemotherapy 10/04/11-10/13/11    taxotere/cisplatin/5FU  . Feeding by G-tube   . S/P radiation therapy 10/13/11 - 10/04/11  . Hx of sinus bradycardia   . DVT, bilateral lower limbs 10/29/11    Hx of  . Depression   . Tonsil cancer 12/04/2011    Past Surgical History  Procedure Date  . Splenectomy   . Tonsillectomy      one side    Current Outpatient Prescriptions  Medication Sig Dispense Refill  . diphenhydrAMINE (BENADRYL) 25 MG tablet Take 25-50 mg by mouth daily as needed. For allergy      . escitalopram (LEXAPRO) 5 MG tablet Take 1 tablet (5 mg total) by mouth every morning.  90 tablet  2  . ferrous sulfate 325 (65 FE) MG tablet Take 325 mg by mouth daily with breakfast.      . fludrocortisone (FLORINEF) 0.1 MG tablet Take 1 tablet (0.1 mg total) by mouth daily.  90 tablet  2  . midodrine (PROAMATINE) 10 MG tablet Take 5 mg by mouth 3 (three) times daily.      Marland Kitchen warfarin (COUMADIN) 5 MG tablet Take 1 tablet (5 mg total) by mouth daily.  60 tablet  1  . Alum & Mag Hydroxide-Simeth (MAGIC MOUTHWASH W/LIDOCAINE) SOLN Take 5 mLs by mouth 4 (four) times daily. Swish and spit before meals and at bedtime.  500 mL  2  . dexamethasone (DECADRON) 2 MG tablet Take 1 tablet (2 mg total) by mouth 2 (two) times daily with a meal.  30 tablet  0  . Ensure Plus (ENSURE PLUS) LIQD Begin Ensure Plus 1/2 can via PEG QID with 120 ml free water before and after each bolus feeding.  If tolerated increase to 1 can of Ensure Plus QID on day 3 and 4.  If tolerated, increase to goal of 1.5  cans QID with 120 ml free water before and after each bolus.  In addition, pt is to drink or put an additional 240 ml free water in PEG daily.  Please send supplies and RN to home no later than Saturday, July 20th.  1422 mL  0  . HYDROcodone-acetaminophen (NORCO) 7.5-325 MG per tablet       . ondansetron (ZOFRAN) 8 MG tablet Take 1 tablet (8 mg total) by mouth 2 (two) times daily as needed (Nausea or vomiting).  30 tablet  1  . prochlorperazine (COMPAZINE) 10 MG tablet Take 1 tablet (10 mg total) by mouth every 6 (six) hours as needed (Nausea or vomiting).  30 tablet  1    ALLERGIES:  is allergic to morphine and related.  REVIEW OF SYSTEMS:  The rest of the 14-point review of system was negative.   Filed Vitals:   12/13/11 1438  BP: 117/75   Pulse: 82  Temp: 98.1 F (36.7 C)  Resp: 20   Wt Readings from Last 3 Encounters:  12/13/11 177 lb (80.287 kg)  11/29/11 166 lb 4.8 oz (75.433 kg)  11/06/11 148 lb 8 oz (67.359 kg)   ECOG Performance status: 0-1  PHYSICAL EXAMINATION:   General: well nourished man, in no acute distress. Eyes: no scleral icterus. ENT: There were no oropharyngeal lesions on my unaided exam. Neck was without thyromegaly. There was no longer fullness in the left cervical neck. Lymphatics: Negative cervical, supraclavicular or axillary adenopathy. Respiratory: lungs were clear bilaterally without wheezing or crackles. Cardiovascular: Regular rate and rhythm, S1/S2, without murmur, rub or gallop. There was no pedal edema. GI: abdomen was soft, flat, nontender, nondistended, without organomegaly.PEG tube was dry, clean, intact, nontender.  Muscoloskeletal: no spinal tenderness of palpation of vertebral spine. Skin exam was without echymosis, petichae. Neuro exam showed left eye not able to adduct laterally. His tongue and uvula were no longer deviated. His left eye still could not adduct. Patient was able to get on and off exam table without assistance.  Patient was alerted and oriented x 4. Attention was good. Language was appropriate. Mood was normal without depression. Speech was not pressured. Thought content was not tangential.     LABORATORY/RADIOLOGY DATA:  Lab Results  Component Value Date   WBC 15.4* 12/13/2011   HGB 13.2 12/13/2011   HCT 39.1 12/13/2011   PLT 247 12/13/2011   GLUCOSE 100* 12/13/2011   CHOL 229* 08/29/2011   TRIG 143 08/29/2011   HDL 34* 08/29/2011   LDLCALC 166* 08/29/2011   ALKPHOS 60 12/13/2011   ALT 22 12/13/2011   AST 11 12/13/2011   NA 141 12/13/2011   K 4.5 12/13/2011   CL 110* 12/13/2011   CREATININE 0.8 12/13/2011   BUN 28.0* 12/13/2011   CO2 20* 12/13/2011   INR 2.00 12/13/2011   HGBA1C 5.9* 08/28/2011      ASSESSMENT AND PLAN:   1. 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  - Impression:  2 cycles of neoadjuvant chemotherapy (without radiation) with very good response. - Recommendation:  As his strength, performance status, nutrition have significantly improved.  He would like to roceed with concurrent weekly chemo Carboplatin/Taxol and daily radiation.  Side effecfs Carboplatin andTaxol include but not limited to mouth sore, low blood count, infection, infusion reaction, numbness and tingling, muscle pain, bone pain, lung inflammation.   -  Supportive care:  *  Nutrition:  Feeding tube; take oral as much as you can.  *  Mouth rinse:    Treatment for head and neck cancer will cause mouth sore.  However, if you are vigilant about mouth rinse, you can delay the onset and the severity of mouth sore.   Here's how:    -Salt/baking soda:  1 teaspoon of salt + 1 teaspoon of baking soda:  Mixed in 1 Liter of clean water.  Take about 3 oz, swish/gargle/ and spit about 3-4 times a day after meals.    - Once you start having thick phlegm because of treatment effects on the salivary glands, you need to do another additional mouth rinse in addition to salt/baking soda to clear out the thick phlegm to avoid gagging.      -One such over the counter method is diluted hydrogen peroxide.  Only make enough for one time use.  Mix about 3 oz of hydrogen peroxide with 12 oz of water (1:4 dilution).  Some patients may feel some burning sensation in the mouth with this, you can try the following.     -1:1 mixture of prescription 2% viscous Lidocaine and over the counter Robitussin.     2. Bilateral DVT. He is on Coumadin at 7.5mg  on Tue/Thu and 5mg  the rest of the week with therapeutic INR.  I recommended to continue this.  Goal is 6 months total of anticoagulation until the end of Nov 2013  .  3. Mild protein calorie malnutrition. Much improved from prior.  He is taking all oral.  He is flushing his PEG tube daily.  4. Mild deconditioning: he had much improvement.  He is able to walk 2-3 miles/day.  I advised him to continue his current exercise regimen while on treatment.   5. Leukocytosis: Due to recent Neulasta. There is no sign of infection. He is afebrile. Will continue to observe.   6. 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  I advised him to remain on Florinef and Proamatine.   7. Bradycardia. Resolved.   8. Depression. He is on Lexapro 5 mg daily with stable mood.   9. Follow-up. Weekly while on chemorad.        The length of time of the face-to-face encounter was 25 minutes. More than 50% of time was spent counseling and coordination of care.

## 2011-12-16 ENCOUNTER — Other Ambulatory Visit: Payer: Self-pay | Admitting: Lab

## 2011-12-16 ENCOUNTER — Ambulatory Visit
Admission: RE | Admit: 2011-12-16 | Discharge: 2011-12-16 | Disposition: A | Discharge status: Home / Self Care | Payer: 59 | Source: Ambulatory Visit | Attending: Radiation Oncology | Admitting: Radiation Oncology

## 2011-12-16 ENCOUNTER — Encounter: Payer: Self-pay | Admitting: Radiation Oncology

## 2011-12-16 ENCOUNTER — Ambulatory Visit: Payer: Self-pay | Admitting: Oncology

## 2011-12-16 ENCOUNTER — Ambulatory Visit: Payer: 59 | Admitting: Nutrition

## 2011-12-16 ENCOUNTER — Ambulatory Visit (HOSPITAL_BASED_OUTPATIENT_CLINIC_OR_DEPARTMENT_OTHER): Payer: 59

## 2011-12-16 VITALS — BP 109/80 | HR 67 | Temp 98.2°F | Resp 18

## 2011-12-16 DIAGNOSIS — C119 Malignant neoplasm of nasopharynx, unspecified: Secondary | ICD-10-CM

## 2011-12-16 DIAGNOSIS — C7800 Secondary malignant neoplasm of unspecified lung: Secondary | ICD-10-CM

## 2011-12-16 DIAGNOSIS — Z5111 Encounter for antineoplastic chemotherapy: Secondary | ICD-10-CM

## 2011-12-16 DIAGNOSIS — C099 Malignant neoplasm of tonsil, unspecified: Secondary | ICD-10-CM

## 2011-12-16 DIAGNOSIS — C110 Malignant neoplasm of superior wall of nasopharynx: Secondary | ICD-10-CM

## 2011-12-16 MED ORDER — PACLITAXEL CHEMO INJECTION 300 MG/50ML
45.0000 mg/m2 | Freq: Once | INTRAVENOUS | Status: AC
  Administered 2011-12-16: 84 mg via INTRAVENOUS
  Filled 2011-12-16: qty 14

## 2011-12-16 MED ORDER — FAMOTIDINE IN NACL 20-0.9 MG/50ML-% IV SOLN
20.0000 mg | Freq: Once | INTRAVENOUS | Status: AC
  Administered 2011-12-16: 20 mg via INTRAVENOUS

## 2011-12-16 MED ORDER — DEXAMETHASONE SODIUM PHOSPHATE 4 MG/ML IJ SOLN
20.0000 mg | Freq: Once | INTRAMUSCULAR | Status: AC
  Administered 2011-12-16: 20 mg via INTRAVENOUS

## 2011-12-16 MED ORDER — SODIUM CHLORIDE 0.9 % IJ SOLN
10.0000 mL | INTRAMUSCULAR | Status: DC | PRN
  Administered 2011-12-16: 10 mL
  Filled 2011-12-16: qty 10

## 2011-12-16 MED ORDER — DIPHENHYDRAMINE HCL 50 MG/ML IJ SOLN
50.0000 mg | Freq: Once | INTRAMUSCULAR | Status: AC
  Administered 2011-12-16: 50 mg via INTRAVENOUS

## 2011-12-16 MED ORDER — HEPARIN SOD (PORK) LOCK FLUSH 100 UNIT/ML IV SOLN
500.0000 [IU] | Freq: Once | INTRAVENOUS | Status: AC | PRN
  Administered 2011-12-16: 500 [IU]
  Filled 2011-12-16: qty 5

## 2011-12-16 MED ORDER — SODIUM CHLORIDE 0.9 % IV SOLN
Freq: Once | INTRAVENOUS | Status: AC
  Administered 2011-12-16: 14:00:00 via INTRAVENOUS

## 2011-12-16 MED ORDER — SODIUM CHLORIDE 0.9 % IV SOLN
259.4000 mg | Freq: Once | INTRAVENOUS | Status: AC
  Administered 2011-12-16: 260 mg via INTRAVENOUS
  Filled 2011-12-16: qty 26

## 2011-12-16 MED ORDER — ONDANSETRON 16 MG/50ML IVPB (CHCC)
16.0000 mg | Freq: Once | INTRAVENOUS | Status: AC
  Administered 2011-12-16: 16 mg via INTRAVENOUS

## 2011-12-16 NOTE — Progress Notes (Signed)
Treatment Plan Dated for 12/16/2011

## 2011-12-16 NOTE — Progress Notes (Signed)
IMRT Device Note   11.5 delivered field widths represent one set of IMRT treatment devices. The code is [phone].

## 2011-12-16 NOTE — Patient Instructions (Addendum)
[facility] Discharge Instructions for Patients Receiving Chemotherapy  Today you received the following chemotherapy agents Taxol and Carboplatin  To help prevent nausea and vomiting after your treatment, we encourage you to take your nausea medication  Begin taking it at 7 pm and take it as often as prescribed for the next 24 to 72 hours.   If you develop nausea and vomiting that is not controlled by your nausea medication, call the clinic. If it is after clinic hours your family physician or the after hours number for the clinic or go to the Emergency Department.   BELOW ARE SYMPTOMS THAT SHOULD BE REPORTED IMMEDIATELY:  *FEVER GREATER THAN 100.5 F  *CHILLS WITH OR WITHOUT FEVER  NAUSEA AND VOMITING THAT IS NOT CONTROLLED WITH YOUR NAUSEA MEDICATION  *UNUSUAL SHORTNESS OF BREATH  *UNUSUAL BRUISING OR BLEEDING  TENDERNESS IN MOUTH AND THROAT WITH OR WITHOUT PRESENCE OF ULCERS  *URINARY PROBLEMS  *BOWEL PROBLEMS  UNUSUAL RASH Items with * indicate a potential emergency and should be followed up as soon as possible.  One of the nurses will contact you 24 hours after your treatment. Please let the nurse know about any problems that you may have experienced. Feel free to call the clinic you have any questions or concerns. The clinic phone number is [phone].   I have been informed and understand all the instructions given to me. I know to contact the clinic, my physician, or go to the Emergency Department if any problems should occur. I do not have any questions at this time, but understand that I may call the clinic during office hours   should I have any questions or need assistance in obtaining follow up care.    __________________________________________  _____________  __________ Signature of Patient or Authorized Representative            Date                   Time    __________________________________________ Nurse's Signature    

## 2011-12-16 NOTE — Progress Notes (Signed)
   Weekly Management Note Current Dose:   2 Gy  Projected Dose: 70 Gy   Narrative:  The patient presents for routine under treatment assessment.  CBCT/MVCT images/Port film x-rays were reviewed.  The chart was checked. No complaints thus far. Walking up to 3 mi/day.  Physical Findings: Blood pressure 109/80 pulse 67 temperature 98.2 respiratory rate 18. No distress.  CBC    Component Value Date/Time   WBC 15.4* 12/13/2011 1358   WBC 38.7* 10/18/2011 1017   RBC 3.89* 12/13/2011 1358   RBC 3.78* 10/18/2011 1017   HGB 13.2 12/13/2011 1358   HGB 11.7* 10/18/2011 1017   HCT 39.1 12/13/2011 1358   HCT 33.6* 10/18/2011 1017   PLT 247 12/13/2011 1358   PLT 240 10/18/2011 1017   MCV 100.3* 12/13/2011 1358   MCV 88.9 10/18/2011 1017   MCH 33.9* 12/13/2011 1358   MCH 31.0 10/18/2011 1017   MCHC 33.8 12/13/2011 1358   MCHC 34.8 10/18/2011 1017   RDW 16.7* 12/13/2011 1358   RDW 15.8* 10/18/2011 1017   LYMPHSABS 0.5* 12/13/2011 1358   LYMPHSABS 1.9 09/11/2011 0030   MONOABS 0.5 12/13/2011 1358   MONOABS 1.3* 09/11/2011 0030   EOSABS 0.0 12/13/2011 1358   EOSABS 0.0 09/11/2011 0030   BASOSABS 0.0 12/13/2011 1358   BASOSABS 0.0 09/11/2011 0030    CMP     Component Value Date/Time   NA 141 12/13/2011 1358   NA 138 11/06/2011 1020   K 4.5 12/13/2011 1358   K 3.8 11/06/2011 1020   CL 110* 12/13/2011 1358   CL 103 11/06/2011 1020   CO2 20* 12/13/2011 1358   CO2 26 11/06/2011 1020   GLUCOSE 100* 12/13/2011 1358   GLUCOSE 85 11/06/2011 1020   BUN 28.0* 12/13/2011 1358   BUN 15 11/06/2011 1020   CREATININE 0.8 12/13/2011 1358   CREATININE 0.49* 11/06/2011 1020   CALCIUM 9.0 12/13/2011 1358   CALCIUM 8.7 11/06/2011 1020   PROT 6.0* 12/13/2011 1358   PROT 4.9* 11/06/2011 1020   ALBUMIN 3.2* 12/13/2011 1358   ALBUMIN 3.0* 11/06/2011 1020   AST 11 12/13/2011 1358   AST 17 11/06/2011 1020   ALT 22 12/13/2011 1358   ALT 40 11/06/2011 1020   ALKPHOS 60 12/13/2011 1358   ALKPHOS 70 11/06/2011 1020   BILITOT 0.50 12/13/2011 1358   BILITOT 0.3  11/06/2011 1020   GFRNONAA >90 09/20/2011 0443   GFRAA >90 09/20/2011 0443    Impression:  The patient is tolerating radiotherapy.  Plan:  Continue radiotherapy as planned.  ________________________________   Lonie Peak, M.D.

## 2011-12-17 ENCOUNTER — Ambulatory Visit
Admission: RE | Admit: 2011-12-17 | Discharge: 2011-12-17 | Disposition: A | Discharge status: Home / Self Care | Payer: 59 | Source: Ambulatory Visit | Attending: Radiation Oncology | Admitting: Radiation Oncology

## 2011-12-17 ENCOUNTER — Telehealth: Payer: Self-pay | Admitting: *Deleted

## 2011-12-17 NOTE — Progress Notes (Signed)
Mr. Hauk was diagnosed with recurrent tonsil cancer in the nasopharynx. He reports he is only using his feeding tube for water flushes.  He is eating regular foods by mouth without any difficulty.  He does not need to use Ensure or Boost or milkshakes to supplement his oral intake at this time.  His weight has increased to 177 pounds from 137 pounds July 24th.  NUTRITION DIAGNOSIS:  Food and nutrition-related knowledge deficit improved.  Diagnosis of unintended weight loss resolved.  INTERVENTION:  I have educated Mr. Rahming to continue small amounts of food often throughout the day, focusing on lean protein sources to promote weight maintenance.  I have instructed him to add in oral nutrition supplements as needed if oral intake decreases secondary to chemotherapy and radiation treatments.  Patient able to repeat back methods for increasing oral intake.  MONITORING/EVALUATION/GOALS:  The patient has tolerated oral diet to promote weight gain.  NEXT VISIT:  Monday, September 23rd during chemotherapy.   ______________________________ Zenovia Jarred, RD, CSO, LDN Clinical Nutrition Specialist BN/MEDQ  D:  12/16/2011  T:  12/17/2011  Job:  [id number]

## 2011-12-17 NOTE — Telephone Encounter (Signed)
Attempted to call to speak to patient to assure he was doing ok since first chemo was yesterday.  No answer, left message to call us back should he be having any ill effects or has any other questions.

## 2011-12-17 NOTE — Progress Notes (Signed)
LATE ENTRY:  After Radiation therapy today educated Mr. Barreiro and his spouse in side effect management during treatment.  Addressed management of pain, sore mouth, sore throat, fatigue, skin, nutrition including additional protein in diet.  Instructed to use a electric razor when shaving to avoid irritation of the lower jaw and neck area during treatment.  Given Biafine to apply 2-3 times daily and encouraged sun protection to prevent irritation of skin of face and neck during treatment since this area may become more sun sensitive during radiation therapy and for months following treatment..  Informed that Dr. Basilio Cairo see's all of her patients every Monday following his treatment and if this changes he will be notified in advance.reviewed importance of performing swallowing and jaw exercises to prevent increased swallowing difficulty in the future and to prevent trismus of the jaw.  He stated understanding of all information and he and spouse informed that this information will be repeated frequently.    Teach Back method.  Given business card and informed on how to reach physicians after hours.

## 2011-12-18 ENCOUNTER — Ambulatory Visit
Admission: RE | Admit: 2011-12-18 | Discharge: 2011-12-18 | Disposition: A | Discharge status: Home / Self Care | Payer: 59 | Source: Ambulatory Visit | Attending: Radiation Oncology | Admitting: Radiation Oncology

## 2011-12-19 ENCOUNTER — Ambulatory Visit
Admission: RE | Admit: 2011-12-19 | Discharge: 2011-12-19 | Disposition: A | Discharge status: Home / Self Care | Payer: 59 | Source: Ambulatory Visit | Attending: Radiation Oncology | Admitting: Radiation Oncology

## 2011-12-19 ENCOUNTER — Telehealth: Payer: Self-pay | Admitting: Radiation Oncology

## 2011-12-19 NOTE — Telephone Encounter (Signed)
CORRECTION  MCD application being mailed to [facility]. Pt is [place] residence.

## 2011-12-19 NOTE — Telephone Encounter (Signed)
Sending MCD application to [facility] and Jaclyn Shaggy application being emailed to [place] today.

## 2011-12-20 ENCOUNTER — Encounter: Payer: Self-pay | Admitting: Oncology

## 2011-12-20 ENCOUNTER — Ambulatory Visit (HOSPITAL_BASED_OUTPATIENT_CLINIC_OR_DEPARTMENT_OTHER): Payer: 59 | Admitting: Oncology

## 2011-12-20 ENCOUNTER — Other Ambulatory Visit (HOSPITAL_BASED_OUTPATIENT_CLINIC_OR_DEPARTMENT_OTHER): Payer: 59 | Admitting: Lab

## 2011-12-20 ENCOUNTER — Ambulatory Visit
Admission: RE | Admit: 2011-12-20 | Discharge: 2011-12-20 | Disposition: A | Discharge status: Home / Self Care | Payer: 59 | Source: Ambulatory Visit | Attending: Radiation Oncology | Admitting: Radiation Oncology

## 2011-12-20 ENCOUNTER — Ambulatory Visit: Payer: 59 | Admitting: Lab

## 2011-12-20 ENCOUNTER — Other Ambulatory Visit: Payer: Self-pay | Admitting: *Deleted

## 2011-12-20 ENCOUNTER — Telehealth: Payer: Self-pay | Admitting: *Deleted

## 2011-12-20 VITALS — BP 131/78 | HR 72 | Temp 98.0°F | Resp 20 | Ht 67.0 in | Wt 174.7 lb

## 2011-12-20 DIAGNOSIS — F329 Major depressive disorder, single episode, unspecified: Secondary | ICD-10-CM

## 2011-12-20 DIAGNOSIS — C099 Malignant neoplasm of tonsil, unspecified: Secondary | ICD-10-CM

## 2011-12-20 DIAGNOSIS — I82409 Acute embolism and thrombosis of unspecified deep veins of unspecified lower extremity: Secondary | ICD-10-CM

## 2011-12-20 DIAGNOSIS — C119 Malignant neoplasm of nasopharynx, unspecified: Secondary | ICD-10-CM

## 2011-12-20 DIAGNOSIS — C50919 Malignant neoplasm of unspecified site of unspecified female breast: Secondary | ICD-10-CM

## 2011-12-20 LAB — CBC WITH DIFFERENTIAL/PLATELET
BASO%: 0.1 % (ref 0.0–2.0)
Eosinophils Absolute: 0 10*3/uL (ref 0.0–0.5)
LYMPH%: 3.9 % — ABNORMAL LOW (ref 14.0–49.0)
MCHC: 33 g/dL (ref 32.0–36.0)
MONO#: 0.4 10*3/uL (ref 0.1–0.9)
NEUT#: 12.1 10*3/uL — ABNORMAL HIGH (ref 1.5–6.5)
Platelets: 256 10*3/uL (ref 140–400)
RBC: 4.06 10*6/uL — ABNORMAL LOW (ref 4.20–5.82)
RDW: 15.4 % — ABNORMAL HIGH (ref 11.0–14.6)
WBC: 13.1 10*3/uL — ABNORMAL HIGH (ref 4.0–10.3)
lymph#: 0.5 10*3/uL — ABNORMAL LOW (ref 0.9–3.3)

## 2011-12-20 LAB — BASIC METABOLIC PANEL (CC13)
CO2: 26 mEq/L (ref 22–29)
Calcium: 9.8 mg/dL (ref 8.4–10.4)
Chloride: 102 mEq/L (ref 98–107)
Glucose: 76 mg/dl (ref 70–99)
Potassium: 4.5 mEq/L (ref 3.5–5.1)
Sodium: 138 mEq/L (ref 136–145)

## 2011-12-20 NOTE — Telephone Encounter (Signed)
Called pt w/ INR results and instructions to increase Coumadin per Clenton Pare, NP,.  Pt verbalized understanding to increase coumadin to 7.5 mg on Tues/Thurs/Sat and 5 mg rest of week.

## 2011-12-20 NOTE — Progress Notes (Signed)
[facility]  Telephone:(336) [phone] Fax:([phone]   OFFICE PROGRESS NOTE   Cc:  [name], MD   DIAGNOSIS: Recurrent T2 N2b M0 tonsillar cancer.   PAST THERAPY: He underwent robotic resection with Dr. Hezzie Bump at [facility] in addition to left neck dissection. He was recommended to receive adjuvant radiation therapy. However, he declined at that time. He received his treatment in 2011.   He developed recurrent disease in May 2013. He received 2 cycles of induction chemo Taxotere, Cisplatin, and 5-FU in June and July 2013.  He had very good partial response but grade 3 toxicity with anorexia, weight loss and decided to stop therapy.   CURRENT THERAPY: Concurrent chemoradiation therapy. Radiation is daily. He is receiving Carboplatin and Taxol weekly Chemotherapy was started on 12/16/11.  INTERVAL HISTORY: Shane Marsh 61 y.o. male returns for regular follow up with his wife. He started treatment earlier this week. He tolerated it very well. He is still able to walk about 2 miles everyday.  He has mild SOB at the end of each walk; however, he is very functional.  He is independent of activities of daily living.  He no longer needs PEG tube.  He is eating and drinking by mouth. Appetite has been excellent. He denied mucositis, nausea/vomiting, hearing loss, neuropathy, nausea/vomiting, CP, palpitation.  He still has left eye ptosis and diplopia.  However, he no longer has syncope. No complaints of neuropathy.     Past Medical History  Diagnosis Date  . Hypertension   . 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 GERD (gastroesophageal reflux disease) 10/15/2011  . Protein calorie malnutrition 10/15/2011  . Status post chemotherapy 10/04/11-10/13/11    taxotere/cisplatin/5FU  . Feeding by G-tube   . S/P radiation therapy 10/13/11 - 10/04/11  . Hx of sinus bradycardia   . DVT, bilateral lower limbs 10/29/11    Hx of  . Depression   . Tonsil cancer  12/04/2011    Past Surgical History  Procedure Date  . Splenectomy   . Tonsillectomy     one side    Current Outpatient Prescriptions  Medication Sig Dispense Refill  . Alum & Mag Hydroxide-Simeth (MAGIC MOUTHWASH W/LIDOCAINE) SOLN Take 5 mLs by mouth 4 (four) times daily. Swish and spit before meals and at bedtime.  500 mL  2  . dexamethasone (DECADRON) 2 MG tablet Take 1 tablet (2 mg total) by mouth 2 (two) times daily with a meal.  30 tablet  0  . diphenhydrAMINE (BENADRYL) 25 MG tablet Take 25-50 mg by mouth daily as needed. For allergy      . emollient (BIAFINE) cream Apply 1 Tube topically 2 (two) times daily.      . Ensure Plus (ENSURE PLUS) LIQD Begin Ensure Plus 1/2 can via PEG QID with 120 ml free water before and after each bolus feeding.  If tolerated increase to 1 can of Ensure Plus QID on day 3 and 4.  If tolerated, increase to goal of 1.5 cans QID with 120 ml free water before and after each bolus.  In addition, pt is to drink or put an additional 240 ml free water in PEG daily.  Please send supplies and RN to home no later than Saturday, July 20th.  1422 mL  0  . escitalopram (LEXAPRO) 5 MG tablet Take 1 tablet (5 mg total) by mouth every morning.  90 tablet  2  . ferrous sulfate 325 (65 FE)  MG tablet Take 325 mg by mouth daily with breakfast.      . fludrocortisone (FLORINEF) 0.1 MG tablet Take 1 tablet (0.1 mg total) by mouth daily.  90 tablet  2  . HYDROcodone-acetaminophen (NORCO) 7.5-325 MG per tablet       . midodrine (PROAMATINE) 10 MG tablet Take 5 mg by mouth 3 (three) times daily.      . ondansetron (ZOFRAN) 8 MG tablet Take 1 tablet (8 mg total) by mouth 2 (two) times daily as needed (Nausea or vomiting).  30 tablet  1  . prochlorperazine (COMPAZINE) 10 MG tablet Take 1 tablet (10 mg total) by mouth every 6 (six) hours as needed (Nausea or vomiting).  30 tablet  1  . warfarin (COUMADIN) 5 MG tablet Take 1 tablet (5 mg total) by mouth daily.  60 tablet  1     ALLERGIES:  is allergic to morphine and related.  REVIEW OF SYSTEMS:  The rest of the 14-point review of system was negative.   Filed Vitals:   12/20/11 1142  BP: 131/78  Pulse: 72  Temp: 98 F (36.7 C)  Resp: 20   Wt Readings from Last 3 Encounters:  12/20/11 174 lb 11.2 oz (79.243 kg)  12/13/11 177 lb (80.287 kg)  11/29/11 166 lb 4.8 oz (75.433 kg)   ECOG Performance status: 0-1  PHYSICAL EXAMINATION:   General: well nourished man, in no acute distress. Eyes: no scleral icterus. ENT: There were no oropharyngeal lesions on my unaided exam. Neck was without thyromegaly. There was no longer fullness in the left cervical neck. Lymphatics: Negative cervical, supraclavicular or axillary adenopathy. Respiratory: lungs were clear bilaterally without wheezing or crackles. Cardiovascular: Regular rate and rhythm, S1/S2, without murmur, rub or gallop. There was no pedal edema. GI: abdomen was soft, flat, nontender, nondistended, without organomegaly.PEG tube was dry, clean, intact, nontender.  Muscoloskeletal: no spinal tenderness of palpation of vertebral spine. Skin exam was without echymosis, petichae. Neuro exam showed left eye not able to adduct laterally. His tongue and uvula were no longer deviated. His left eye still could not adduct. Patient was able to get on and off exam table without assistance.  Patient was alerted and oriented x 4. Attention was good. Language was appropriate. Mood was normal without depression. Speech was not pressured. Thought content was not tangential.     LABORATORY/RADIOLOGY DATA:  Lab Results  Component Value Date   WBC 13.1* 12/20/2011   HGB 13.5 12/20/2011   HCT 40.7 12/20/2011   PLT 256 12/20/2011   GLUCOSE 76 12/20/2011   CHOL 229* 08/29/2011   TRIG 143 08/29/2011   HDL 34* 08/29/2011   LDLCALC 166* 08/29/2011   ALKPHOS 60 12/13/2011   ALT 22 12/13/2011   AST 11 12/13/2011   NA 138 12/20/2011   K 4.5 12/20/2011   CL 102 12/20/2011   CREATININE 0.8  12/20/2011   BUN 29.0* 12/20/2011   CO2 26 12/20/2011   INR 1.85* 12/20/2011   HGBA1C 5.9* 08/28/2011      ASSESSMENT AND PLAN:   1. 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 He received 2 cycles of neoadjuvant chemotherapy (without radiation) with very good response. He is now receiving concurrent chemoradiation therapy. Tolerating treatment well at this time. Recommend that he proceed with chemotherapy without dose modification on 12/23/11 as scheduled.   2.  Mucositis prevention: USing salt/baking soda rinses twice a day. Recommend that he increase this to four times per day.  3. Nutrition: Appetite is excellent and weight is stable. Taking all nutrition by mouth at this time. Followed by Vernell Leep.  4. Bilateral DVT. He is on Coumadin at 7.5mg  on Tue/Thu and 5mg  the rest of the week with a subtherapeutic INR. Recommend that he change Coumadin to 7.5 mg on Tues/Thurs/Sat and 5 mg on Mon/Wed.Fri/Sun. Goal is 6 months total of anticoagulation until the end of Nov 2013 .  5. Mild deconditioning: he had much improvement.  He is able to walk 2-3 miles/day.  I advised him to continue his current exercise regimen while on treatment.   6. Leukocytosis: There is no sign of infection. He is afebrile. Will continue to observe.   7. 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  I advised him to remain on Florinef and Proamatine.   8. Bradycardia. Resolved.   9. Depression. He is on Lexapro 5 mg daily with stable mood.   10. Follow-up. Weekly while on chemorad.     The length of time of the face-to-face encounter was 15 minutes. More than 50% of time was spent counseling and coordination of care.

## 2011-12-20 NOTE — Telephone Encounter (Signed)
Message copied by Wende Mott on Fri Dec 20, 2011  2:31 PM ------      Message from: Myrtis Ser      Created: Fri Dec 20, 2011  2:01 PM      Regarding: Patient call       Can you please call Mr Geerts and tell him the INR is low (1.85). Instruct him to increase the Coumadin to 7.5 mg on Tues/Thurs/Sat and 5 mg on Mon/Wed/Fri/Sun.            Thanks

## 2011-12-21 ENCOUNTER — Other Ambulatory Visit: Payer: Self-pay | Admitting: Oncology

## 2011-12-23 ENCOUNTER — Telehealth: Payer: Self-pay | Admitting: Oncology

## 2011-12-23 ENCOUNTER — Ambulatory Visit (HOSPITAL_BASED_OUTPATIENT_CLINIC_OR_DEPARTMENT_OTHER): Payer: 59

## 2011-12-23 ENCOUNTER — Ambulatory Visit: Payer: 59 | Admitting: Nutrition

## 2011-12-23 ENCOUNTER — Ambulatory Visit
Admission: RE | Admit: 2011-12-23 | Discharge: 2011-12-23 | Disposition: A | Discharge status: Home / Self Care | Payer: 59 | Source: Ambulatory Visit | Attending: Radiation Oncology | Admitting: Radiation Oncology

## 2011-12-23 ENCOUNTER — Telehealth: Payer: Self-pay | Admitting: Radiation Oncology

## 2011-12-23 VITALS — BP 137/87 | HR 75 | Temp 98.4°F | Resp 20

## 2011-12-23 DIAGNOSIS — C099 Malignant neoplasm of tonsil, unspecified: Secondary | ICD-10-CM

## 2011-12-23 DIAGNOSIS — C119 Malignant neoplasm of nasopharynx, unspecified: Secondary | ICD-10-CM

## 2011-12-23 DIAGNOSIS — Z5111 Encounter for antineoplastic chemotherapy: Secondary | ICD-10-CM

## 2011-12-23 MED ORDER — SODIUM CHLORIDE 0.9 % IJ SOLN
10.0000 mL | INTRAMUSCULAR | Status: DC | PRN
  Administered 2011-12-23: 10 mL
  Filled 2011-12-23: qty 10

## 2011-12-23 MED ORDER — SODIUM CHLORIDE 0.9 % IV SOLN
Freq: Once | INTRAVENOUS | Status: AC
  Administered 2011-12-23: 10:00:00 via INTRAVENOUS

## 2011-12-23 MED ORDER — DIPHENHYDRAMINE HCL 50 MG/ML IJ SOLN
50.0000 mg | Freq: Once | INTRAMUSCULAR | Status: AC
  Administered 2011-12-23: 50 mg via INTRAVENOUS

## 2011-12-23 MED ORDER — PACLITAXEL CHEMO INJECTION 300 MG/50ML
45.0000 mg/m2 | Freq: Once | INTRAVENOUS | Status: AC
  Administered 2011-12-23: 84 mg via INTRAVENOUS
  Filled 2011-12-23: qty 14

## 2011-12-23 MED ORDER — DEXAMETHASONE SODIUM PHOSPHATE 4 MG/ML IJ SOLN
20.0000 mg | Freq: Once | INTRAMUSCULAR | Status: AC
  Administered 2011-12-23: 20 mg via INTRAVENOUS

## 2011-12-23 MED ORDER — HEPARIN SOD (PORK) LOCK FLUSH 100 UNIT/ML IV SOLN
500.0000 [IU] | Freq: Once | INTRAVENOUS | Status: AC | PRN
  Administered 2011-12-23: 500 [IU]
  Filled 2011-12-23: qty 5

## 2011-12-23 MED ORDER — SODIUM CHLORIDE 0.9 % IJ SOLN
3.0000 mL | INTRAMUSCULAR | Status: DC | PRN
  Filled 2011-12-23: qty 10

## 2011-12-23 MED ORDER — FAMOTIDINE IN NACL 20-0.9 MG/50ML-% IV SOLN
20.0000 mg | Freq: Once | INTRAVENOUS | Status: AC
  Administered 2011-12-23: 20 mg via INTRAVENOUS

## 2011-12-23 MED ORDER — SODIUM CHLORIDE 0.9 % IV SOLN
259.4000 mg | Freq: Once | INTRAVENOUS | Status: AC
  Administered 2011-12-23: 260 mg via INTRAVENOUS
  Filled 2011-12-23: qty 26

## 2011-12-23 MED ORDER — ONDANSETRON 16 MG/50ML IVPB (CHCC)
16.0000 mg | Freq: Once | INTRAVENOUS | Status: AC
  Administered 2011-12-23: 16 mg via INTRAVENOUS

## 2011-12-23 NOTE — Telephone Encounter (Signed)
MCD application mailed to [facility] today.

## 2011-12-23 NOTE — Patient Instructions (Addendum)
[facility] Discharge Instructions for Patients Receiving Chemotherapy  Today you received the following chemotherapy agents :  Taxol,  Carboplatin.  To help prevent nausea and vomiting after your treatment, we encourage you to take your nausea medication as instructed by your physician, and take meds as needed for nausea.    If you develop nausea and vomiting that is not controlled by your nausea medication, call the clinic. If it is after clinic hours your family physician or the after hours number for the clinic or go to the Emergency Department.   BELOW ARE SYMPTOMS THAT SHOULD BE REPORTED IMMEDIATELY:  *FEVER GREATER THAN 100.5 F  *CHILLS WITH OR WITHOUT FEVER  NAUSEA AND VOMITING THAT IS NOT CONTROLLED WITH YOUR NAUSEA MEDICATION  *UNUSUAL SHORTNESS OF BREATH  *UNUSUAL BRUISING OR BLEEDING  TENDERNESS IN MOUTH AND THROAT WITH OR WITHOUT PRESENCE OF ULCERS  *URINARY PROBLEMS  *BOWEL PROBLEMS  UNUSUAL RASH Items with * indicate a potential emergency and should be followed up as soon as possible.  One of the nurses will contact you 24 hours after your treatment. Please let the nurse know about any problems that you may have experienced. Feel free to call the clinic you have any questions or concerns. The clinic phone number is [phone].   I have been informed and understand all the instructions given to me. I know to contact the clinic, my physician, or go to the Emergency Department if any problems should occur. I do not have any questions at this time, but understand that I may call the clinic during office hours   should I have any questions or need assistance in obtaining follow up care.    __________________________________________  _____________  __________ Signature of Patient or Authorized Representative            Date                   Time    __________________________________________ Nurse's Signature

## 2011-12-23 NOTE — Telephone Encounter (Signed)
Talked to patient he is aware of appt in September 27th lab and MD

## 2011-12-24 ENCOUNTER — Ambulatory Visit
Admission: RE | Admit: 2011-12-24 | Discharge: 2011-12-24 | Disposition: A | Discharge status: Home / Self Care | Payer: 59 | Source: Ambulatory Visit | Attending: Radiation Oncology | Admitting: Radiation Oncology

## 2011-12-24 NOTE — Progress Notes (Signed)
Mr. Trebilcock reports the left side of his throat is sore.  He states he is eating okay. He is having regular bowel movements.  He does report decreased taste sensation.  His weight has declined 174.7 pounds from 177 pounds.  NUTRITION DIAGNOSIS:  Food and nutrition related knowledge deficit improved.  INTERVENTION:  I have educated Mr. Ryner to focus on adequate calories and protein in small amounts throughout the day.  I have encouraged him to take pain medication as needed; to be sure his oral intake is sufficient.  He is to add in oral nutrition supplements as needed.  MONITORING/EVALUATION/GOALS:  The patient continues to tolerate oral diet, despite having lost a few pounds since last visit.  NEXT VISIT:  Monday September 30th, during chemotherapy.   ______________________________ Zenovia Jarred, RD, CSO, LDN Clinical Nutrition Specialist BN/MEDQ  D:  12/23/2011  T:  12/24/2011  Job:  [id number]

## 2011-12-25 ENCOUNTER — Encounter: Payer: Self-pay | Admitting: Radiation Oncology

## 2011-12-25 ENCOUNTER — Ambulatory Visit
Admission: RE | Admit: 2011-12-25 | Discharge: 2011-12-25 | Disposition: A | Discharge status: Home / Self Care | Payer: 59 | Source: Ambulatory Visit | Attending: Radiation Oncology | Admitting: Radiation Oncology

## 2011-12-25 VITALS — BP 145/94 | HR 61 | Temp 98.2°F | Resp 20 | Wt 178.3 lb

## 2011-12-25 DIAGNOSIS — C119 Malignant neoplasm of nasopharynx, unspecified: Secondary | ICD-10-CM

## 2011-12-25 DIAGNOSIS — C099 Malignant neoplasm of tonsil, unspecified: Secondary | ICD-10-CM

## 2011-12-25 NOTE — Patient Instructions (Addendum)
1.  Head/neck cancer. 2.  Current treatment:  Concurrent chemoradiation with weekly Carboplatin/Taxol and daily radiation.  Watch out for fever, mouth sore, nausea/vomiting.  3.  Mouth sore prevention:  Please remember to rinse with salt/baking soda about 3 times a day (1 teaspoon of salt + 1 teaspoon of baking soda in 1 liter of clean water).  4.  Continue to maintain your nutrition and activity.

## 2011-12-25 NOTE — Progress Notes (Signed)
Pt denies pain, difficulty swallowing, fatigue, loss of appetite. He states he is able to eat any foods he desires. Pt seeing dietician.

## 2011-12-25 NOTE — Progress Notes (Signed)
   Weekly Management Note Current Dose:   16 Gy  Projected Dose: 70 Gy   Narrative:  The patient presents for routine under treatment assessment.  CBCT/MVCT images/Port film x-rays were reviewed.  The chart was checked. Is doing well. Starting to have a little bit of soreness in his throat. Taking Decadron 2 mg daily. Received his chemotherapy, second cycle of carboplatin and Taxol on 9-23.  Physical Findings:  weight is 178 lb 4.8 oz (80.876 kg). His oral temperature is 98.2 F (36.8 C). His blood pressure is 145/94 and his pulse is 61. His respiration is 20.  no oropharyngeal thrush. No mucositis thus far. No palpable lymphadenopathy in his neck/supraclavicular regions. He does have erythema throughout the neck and chest which goes beyond the radiation fields; he reports that this erythema preceded the start of radiotherapy.  Impression:  The patient is tolerating radiotherapy.  Plan:  Continue radiotherapy as planned. I asked the patient bring his medications on Monday. He believes he may have some Magic mouthwash and/or Carafate at home. We will review these medications on Monday to verify what he should take for his esophagitis. He will also discuss further tapering of his Decadron with medical oncology when he sees Dr. Gaylyn Rong in the next week - I will defer to him on that.  ________________________________   Lonie Peak, M.D.

## 2011-12-26 ENCOUNTER — Ambulatory Visit
Admission: RE | Admit: 2011-12-26 | Discharge: 2011-12-26 | Disposition: A | Discharge status: Home / Self Care | Payer: 59 | Source: Ambulatory Visit | Attending: Radiation Oncology | Admitting: Radiation Oncology

## 2011-12-27 ENCOUNTER — Ambulatory Visit (HOSPITAL_BASED_OUTPATIENT_CLINIC_OR_DEPARTMENT_OTHER): Payer: 59 | Admitting: Oncology

## 2011-12-27 ENCOUNTER — Ambulatory Visit: Payer: 59

## 2011-12-27 ENCOUNTER — Telehealth: Payer: Self-pay | Admitting: Oncology

## 2011-12-27 ENCOUNTER — Other Ambulatory Visit (HOSPITAL_BASED_OUTPATIENT_CLINIC_OR_DEPARTMENT_OTHER): Payer: 59 | Admitting: Lab

## 2011-12-27 ENCOUNTER — Telehealth: Payer: Self-pay | Admitting: *Deleted

## 2011-12-27 VITALS — BP 147/93 | HR 66 | Temp 97.7°F | Resp 18 | Ht 67.0 in | Wt 174.9 lb

## 2011-12-27 DIAGNOSIS — C099 Malignant neoplasm of tonsil, unspecified: Secondary | ICD-10-CM

## 2011-12-27 DIAGNOSIS — K1230 Oral mucositis (ulcerative), unspecified: Secondary | ICD-10-CM

## 2011-12-27 DIAGNOSIS — I82409 Acute embolism and thrombosis of unspecified deep veins of unspecified lower extremity: Secondary | ICD-10-CM

## 2011-12-27 DIAGNOSIS — F329 Major depressive disorder, single episode, unspecified: Secondary | ICD-10-CM

## 2011-12-27 DIAGNOSIS — Z86718 Personal history of other venous thrombosis and embolism: Secondary | ICD-10-CM

## 2011-12-27 DIAGNOSIS — I10 Essential (primary) hypertension: Secondary | ICD-10-CM

## 2011-12-27 LAB — CBC WITH DIFFERENTIAL/PLATELET
Basophils Absolute: 0 10*3/uL (ref 0.0–0.1)
Eosinophils Absolute: 0.1 10*3/uL (ref 0.0–0.5)
HGB: 13.7 g/dL (ref 13.0–17.1)
LYMPH%: 11.2 % — ABNORMAL LOW (ref 14.0–49.0)
MCV: 100.1 fL — ABNORMAL HIGH (ref 79.3–98.0)
MONO#: 0.3 10*3/uL (ref 0.1–0.9)
NEUT#: 5.8 10*3/uL (ref 1.5–6.5)
Platelets: 236 10*3/uL (ref 140–400)
RBC: 4.01 10*6/uL — ABNORMAL LOW (ref 4.20–5.82)
WBC: 6.9 10*3/uL (ref 4.0–10.3)

## 2011-12-27 LAB — BASIC METABOLIC PANEL (CC13)
BUN: 20 mg/dL (ref 7.0–26.0)
CO2: 26 mEq/L (ref 22–29)
Calcium: 9.8 mg/dL (ref 8.4–10.4)
Glucose: 79 mg/dl (ref 70–99)
Potassium: 4.5 mEq/L (ref 3.5–5.1)
Sodium: 137 mEq/L (ref 136–145)

## 2011-12-27 MED ORDER — HYDROCODONE-ACETAMINOPHEN 7.5-325 MG/15ML PO SOLN: 15.0000 mL | Freq: Four times a day (QID) | ORAL | Status: DC | PRN

## 2011-12-27 NOTE — Progress Notes (Signed)
[facility]  Telephone:([phone]) [phone] Fax:([phone]) [phone]   OFFICE PROGRESS NOTE   Cc:  [name], MD  DIAGNOSIS: Recurrent T2 N2b M0 tonsillar cancer.   PAST THERAPY: He underwent in 2011 robotic resection with Dr. Hezzie Bump at [facility] in addition to left neck dissection. He was recommended to receive adjuvant radiation therapy. However, he declined at that time.  He developed recurrent disease in May 2013. He received 2 cycles of induction chemo Taxotere, Cisplatin, and 5-FU in June and July 2013. He had very good partial response but grade 3 toxicity with anorexia, weight loss.  CURRENT THERAPY: Concurrent chemoradiation therapy with daily radiation and weekly Carboplatin and Taxol, started on 12/16/11.  INTERVAL HISTORY: Shane Marsh 61 y.o. male returns for regular follow up with his wife.  He reported some mouth sore.  He is doing salt and baking soda mouth rinse after meals.  He has good appetite with stable weight.  He no longer needs his PEG tube.  He is still flushing it.  He denied PEG tube bleeding, purulent discharge or pain.  He has good strength and ambulates about everyday.  He still has mild diplopia; but not as bad as compared to prior to induction chemo. He does not have dizziness, syncope.   Patient denies fever, anorexia, weight loss, fatigue, headache, visual changes, confusion, drenching night sweats, palpable lymph node swelling, mucositis, odynophagia, dysphagia, nausea vomiting, jaundice, chest pain, palpitation, shortness of breath, dyspnea on exertion, productive cough, gum bleeding, epistaxis, hematemesis, hemoptysis, abdominal pain, abdominal swelling, early satiety, melena, hematochezia, hematuria, skin rash, spontaneous bleeding, joint swelling, joint pain, heat or cold intolerance, bowel bladder incontinence, back pain, focal motor weakness, paresthesia, depression, suicidal or homicidal ideation, feeling hopelessness.   Past Medical  History  Diagnosis Date  . Hypertension   . 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 GERD (gastroesophageal reflux disease) 10/15/2011  . Protein calorie malnutrition 10/15/2011  . Status post chemotherapy 10/04/11-10/13/11    taxotere/cisplatin/5FU  . Feeding by G-tube   . S/P radiation therapy 10/13/11 - 10/04/11  . Hx of sinus bradycardia   . DVT, bilateral lower limbs 10/29/11    Hx of  . Depression   . Tonsil cancer 12/04/2011    Past Surgical History  Procedure Date  . Splenectomy   . Tonsillectomy     one side    Current Outpatient Prescriptions  Medication Sig Dispense Refill  . Alum & Mag Hydroxide-Simeth (MAGIC MOUTHWASH W/LIDOCAINE) SOLN Take 5 mLs by mouth 4 (four) times daily. Swish and spit before meals and at bedtime.  500 mL  2  . dexamethasone (DECADRON) 4 MG tablet Take 2 mg by mouth daily.       . diphenhydrAMINE (BENADRYL) 25 MG tablet Take 25-50 mg by mouth daily as needed. For allergy      . emollient (BIAFINE) cream Apply 1 Tube topically 2 (two) times daily.      Marland Kitchen escitalopram (LEXAPRO) 5 MG tablet Take 1 tablet (5 mg total) by mouth every morning.  90 tablet  2  . ferrous sulfate 325 (65 FE) MG tablet Take 325 mg by mouth 3 (three) times daily with meals.       . fludrocortisone (FLORINEF) 0.1 MG tablet Take 1 tablet (0.1 mg total) by mouth daily.  90 tablet  2  . midodrine (PROAMATINE) 10 MG tablet Take 5 mg by mouth 3 (three) times daily.      Marland Kitchen  ondansetron (ZOFRAN) 8 MG tablet Take 1 tablet (8 mg total) by mouth 2 (two) times daily as needed (Nausea or vomiting).  30 tablet  1  . prochlorperazine (COMPAZINE) 10 MG tablet Take 1 tablet (10 mg total) by mouth every 6 (six) hours as needed (Nausea or vomiting).  30 tablet  1  . warfarin (COUMADIN) 5 MG tablet Take 1 tablet (5 mg total) by mouth daily.  60 tablet  1  . Ensure Plus (ENSURE PLUS) LIQD Begin Ensure Plus 1/2 can via PEG QID with 120 ml free water before and after each bolus feeding.   If tolerated increase to 1 can of Ensure Plus QID on day 3 and 4.  If tolerated, increase to goal of 1.5 cans QID with 120 ml free water before and after each bolus.  In addition, pt is to drink or put an additional 240 ml free water in PEG daily.  Please send supplies and RN to home no later than Saturday, July 20th.  1422 mL  0  . hydrocodone-acetaminophen (HYCET) 7.5-325 MG/15ML solution Take 15 mLs by mouth every 6 (six) hours as needed for pain.  500 mL  0    ALLERGIES:  is allergic to morphine and related.  REVIEW OF SYSTEMS:  The rest of the 14-point review of system was negative.   Filed Vitals:   12/27/11 0831  BP: 147/93  Pulse: 66  Temp: 97.7 F (36.5 C)  Resp: 18   Wt Readings from Last 3 Encounters:  12/27/11 174 lb 14.4 oz (79.334 kg)  12/25/11 178 lb 4.8 oz (80.876 kg)  12/20/11 174 lb 11.2 oz (79.243 kg)   ECOG Performance status: 0-1  PHYSICAL EXAMINATION:   General: well nourished man, in no acute distress. Eyes: no scleral icterus. ENT: There were no oropharyngeal lesions on my unaided exam. Neck was without thyromegaly. There was no longer fullness in the left cervical neck. Lymphatics: Negative cervical, supraclavicular or axillary adenopathy. Respiratory: lungs were clear bilaterally without wheezing or crackles. Cardiovascular: Regular rate and rhythm, S1/S2, without murmur, rub or gallop. There was no pedal edema. GI: abdomen was soft, flat, nontender, nondistended, without organomegaly.PEG tube was dry, clean, intact, nontender. Muscoloskeletal: no spinal tenderness of palpation of vertebral spine. Skin exam was without echymosis, petichae. 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 His tongue and uvula were no longer deviated.  Patient was able to get on and off exam table without assistance. Patient was alerted and oriented x 4. Attention was good. Language was appropriate. Mood was normal without depression. Speech was not pressured. Thought  content was not tangential.        LABORATORY/RADIOLOGY DATA:  Lab Results  Component Value Date   WBC 6.9 12/27/2011   HGB 13.7 12/27/2011   HCT 40.1 12/27/2011   PLT 236 12/27/2011   GLUCOSE 79 12/27/2011   CHOL 229* 08/29/2011   TRIG 143 08/29/2011   HDL 34* 08/29/2011   LDLCALC 166* 08/29/2011   ALKPHOS 60 12/13/2011   ALT 22 12/13/2011   AST 11 12/13/2011   NA 137 12/27/2011   K 4.5 12/27/2011   CL 102 12/27/2011   CREATININE 0.8 12/27/2011   BUN 20.0 12/27/2011   CO2 26 12/27/2011   INR 2.67* 12/27/2011   HGBA1C 5.9* 08/28/2011    ASSESSMENT AND PLAN:    1.  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  - Impression: 2 cycles of neoadjuvant chemotherapy (without radiation) with very good response.  He is s/p 2 cycles of weekly concurrent chemorad with Carboplatin/Taxol.  He has grade 1 mucositis.  He does not have dose limiting toxicity.  - Recommendation: Continue with concurrent weekly chemo Carboplatin/Taxol and daily radiation without dose modification.   2.  Mucositis from chemorad:   - I advised him to continue salt/baking soda mouth rinse.  - I start him on Lortab elixir prn.  3. Bilateral DVT. He is on Coumadin at 7.5mg  on Tue/Thu and 5mg  the rest of the week with therapeutic INR. I recommended to continue this without change today. Goal is 6 months total of anticoagulation until the end of Nov 2013 .   4. Mild protein calorie malnutrition. Much improved from prior. He is taking all oral. He is flushing his PEG tube daily.   5 Mild deconditioning: he had much improvement. He is able to walk at least everyday.  I advised him to continue this.   6. 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 I advised him to remain on Florinef.  I decreased his Dexamethasone now to 2mg   PO daily.  When he is here next time, this should be decreased to 2mg  PO everyother day for 1 week and then stop.  7. Bradycardia. Resolved.   8. Depression. He is on Lexapro 5 mg daily with stable mood.   9. Follow-up. Weekly while on chemorad.     The length of time of the face-to-face encounter was 15  minutes. More than 50% of time was spent counseling and coordination of care.

## 2011-12-27 NOTE — Telephone Encounter (Signed)
appt made and printed for pt aom °

## 2011-12-27 NOTE — Telephone Encounter (Signed)
Called pt and instructed to continue current dose of coumadin today per Dr. Gaylyn Rong.  Pt verbalized understanding,  States he has been taking 7.5 mg on Tues, Thurs and Sats and 5 mg rest of the week.  Instructed pt to continue w/ that same dose.

## 2011-12-27 NOTE — Telephone Encounter (Signed)
Message copied by Wende Mott on Fri Dec 27, 2011 11:47 AM ------      Message from: Jethro Bolus T      Created: Fri Dec 27, 2011  9:35 AM       Please call pt and advise him to continue Coumadin same day; no change.  Thanks.

## 2011-12-30 ENCOUNTER — Ambulatory Visit
Admission: RE | Admit: 2011-12-30 | Discharge: 2011-12-30 | Disposition: A | Discharge status: Home / Self Care | Payer: 59 | Source: Ambulatory Visit | Attending: Radiation Oncology | Admitting: Radiation Oncology

## 2011-12-30 ENCOUNTER — Encounter: Payer: Self-pay | Admitting: Radiation Oncology

## 2011-12-30 ENCOUNTER — Ambulatory Visit: Payer: 59 | Admitting: Nutrition

## 2011-12-30 ENCOUNTER — Ambulatory Visit (HOSPITAL_BASED_OUTPATIENT_CLINIC_OR_DEPARTMENT_OTHER): Payer: 59

## 2011-12-30 VITALS — BP 158/94 | HR 73 | Temp 98.3°F | Resp 20 | Wt 175.7 lb

## 2011-12-30 DIAGNOSIS — Z5111 Encounter for antineoplastic chemotherapy: Secondary | ICD-10-CM

## 2011-12-30 DIAGNOSIS — C119 Malignant neoplasm of nasopharynx, unspecified: Secondary | ICD-10-CM

## 2011-12-30 DIAGNOSIS — C099 Malignant neoplasm of tonsil, unspecified: Secondary | ICD-10-CM

## 2011-12-30 MED ORDER — CARBOPLATIN CHEMO INJECTION 450 MG/45ML
259.4000 mg | Freq: Once | INTRAVENOUS | Status: AC
  Administered 2011-12-30: 260 mg via INTRAVENOUS
  Filled 2011-12-30: qty 26

## 2011-12-30 MED ORDER — DEXAMETHASONE SODIUM PHOSPHATE 4 MG/ML IJ SOLN
20.0000 mg | Freq: Once | INTRAMUSCULAR | Status: AC
  Administered 2011-12-30: 20 mg via INTRAVENOUS

## 2011-12-30 MED ORDER — HEPARIN SOD (PORK) LOCK FLUSH 100 UNIT/ML IV SOLN
500.0000 [IU] | Freq: Once | INTRAVENOUS | Status: AC | PRN
  Administered 2011-12-30: 500 [IU]
  Filled 2011-12-30: qty 5

## 2011-12-30 MED ORDER — SODIUM CHLORIDE 0.9 % IV SOLN
Freq: Once | INTRAVENOUS | Status: AC
  Administered 2011-12-30: 10:00:00 via INTRAVENOUS

## 2011-12-30 MED ORDER — SODIUM CHLORIDE 0.9 % IJ SOLN
10.0000 mL | INTRAMUSCULAR | Status: DC | PRN
  Administered 2011-12-30: 10 mL
  Filled 2011-12-30: qty 10

## 2011-12-30 MED ORDER — PACLITAXEL CHEMO INJECTION 300 MG/50ML
45.0000 mg/m2 | Freq: Once | INTRAVENOUS | Status: AC
  Administered 2011-12-30: 84 mg via INTRAVENOUS
  Filled 2011-12-30: qty 14

## 2011-12-30 MED ORDER — ONDANSETRON 16 MG/50ML IVPB (CHCC)
16.0000 mg | Freq: Once | INTRAVENOUS | Status: AC
  Administered 2011-12-30: 16 mg via INTRAVENOUS

## 2011-12-30 MED ORDER — FAMOTIDINE IN NACL 20-0.9 MG/50ML-% IV SOLN
20.0000 mg | Freq: Once | INTRAVENOUS | Status: AC
  Administered 2011-12-30: 20 mg via INTRAVENOUS

## 2011-12-30 MED ORDER — DIPHENHYDRAMINE HCL 50 MG/ML IJ SOLN
50.0000 mg | Freq: Once | INTRAMUSCULAR | Status: AC
  Administered 2011-12-30: 50 mg via INTRAVENOUS

## 2011-12-30 NOTE — Progress Notes (Signed)
Pt has no c/o today, denies pain, fatigue, loss of appetite.

## 2011-12-30 NOTE — Progress Notes (Signed)
   Weekly Management Note Current Dose:  20 Gy  Projected Dose: 70 Gy   Narrative:  The patient presents for routine under treatment assessment.  CBCT/MVCT images/Port film x-rays were reviewed.  The chart was checked. He denies esophagitis.  Getting chemotherapy today.   Physical Findings:  weight is 175 lb 11.2 oz (79.697 kg). His oral temperature is 98.3 F (36.8 C). His blood pressure is 158/94 and his pulse is 73. His respiration is 20.  diffuse erythema throughout the oropharynx. Skin over his neck is erythematous with no palpable adenopathy.  CBC    Component Value Date/Time   WBC 6.9 12/27/2011 0807   WBC 38.7* 10/18/2011 1017   RBC 4.01* 12/27/2011 0807   RBC 3.78* 10/18/2011 1017   HGB 13.7 12/27/2011 0807   HGB 11.7* 10/18/2011 1017   HCT 40.1 12/27/2011 0807   HCT 33.6* 10/18/2011 1017   PLT 236 12/27/2011 0807   PLT 240 10/18/2011 1017   MCV 100.1* 12/27/2011 0807   MCV 88.9 10/18/2011 1017   MCH 34.2* 12/27/2011 0807   MCH 31.0 10/18/2011 1017   MCHC 34.1 12/27/2011 0807   MCHC 34.8 10/18/2011 1017   RDW 15.2* 12/27/2011 0807   RDW 15.8* 10/18/2011 1017   LYMPHSABS 0.8* 12/27/2011 0807   LYMPHSABS 1.9 09/11/2011 0030   MONOABS 0.3 12/27/2011 0807   MONOABS 1.3* 09/11/2011 0030   EOSABS 0.1 12/27/2011 0807   EOSABS 0.0 09/11/2011 0030   BASOSABS 0.0 12/27/2011 0807   BASOSABS 0.0 09/11/2011 0030    CMP     Component Value Date/Time   NA 137 12/27/2011 0807   NA 138 11/06/2011 1020   K 4.5 12/27/2011 0807   K 3.8 11/06/2011 1020   CL 102 12/27/2011 0807   CL 103 11/06/2011 1020   CO2 26 12/27/2011 0807   CO2 26 11/06/2011 1020   GLUCOSE 79 12/27/2011 0807   GLUCOSE 85 11/06/2011 1020   BUN 20.0 12/27/2011 0807   BUN 15 11/06/2011 1020   CREATININE 0.8 12/27/2011 0807   CREATININE 0.49* 11/06/2011 1020   CALCIUM 9.8 12/27/2011 0807   CALCIUM 8.7 11/06/2011 1020   PROT 6.0* 12/13/2011 1358   PROT 4.9* 11/06/2011 1020   ALBUMIN 3.2* 12/13/2011 1358   ALBUMIN 3.0* 11/06/2011 1020   AST 11 12/13/2011  1358   AST 17 11/06/2011 1020   ALT 22 12/13/2011 1358   ALT 40 11/06/2011 1020   ALKPHOS 60 12/13/2011 1358   ALKPHOS 70 11/06/2011 1020   BILITOT 0.50 12/13/2011 1358   BILITOT 0.3 11/06/2011 1020   GFRNONAA >90 09/20/2011 0443   GFRAA >90 09/20/2011 0443     Impression:  The patient is tolerating radiotherapy.  Plan:  Continue radiotherapy as planned. He has prescriptions for mucositis mouthwash with lidocaine and Lortab elixir. I instructed him on how to use these when he develops esophagitis.  ________________________________   Lonie Peak, M.D.

## 2011-12-30 NOTE — Patient Instructions (Addendum)
[facility] Discharge Instructions for Patients Receiving Chemotherapy  Today you received the following chemotherapy agents; Taxol and Carboplating.  To help prevent nausea and vomiting after your treatment, we encourage you to take your nausea medication ; Compazine (prochloraperzine) and Zofran (ondansetron) as directed.   If you develop nausea and vomiting that is not controlled by your nausea medication, call the clinic. If it is after clinic hours your family physician or the after hours number for the clinic or go to the Emergency Department.   BELOW ARE SYMPTOMS THAT SHOULD BE REPORTED IMMEDIATELY:  *FEVER GREATER THAN 100.5 F  *CHILLS WITH OR WITHOUT FEVER  NAUSEA AND VOMITING THAT IS NOT CONTROLLED WITH YOUR NAUSEA MEDICATION  *UNUSUAL SHORTNESS OF BREATH  *UNUSUAL BRUISING OR BLEEDING  TENDERNESS IN MOUTH AND THROAT WITH OR WITHOUT PRESENCE OF ULCERS  *URINARY PROBLEMS  *BOWEL PROBLEMS  UNUSUAL RASH Items with * indicate a potential emergency and should be followed up as soon as possible.   Feel free to call the clinic you have any questions or concerns. The clinic phone number is [phone].   I have been informed and understand all the instructions given to me. I know to contact the clinic, my physician, or go to the Emergency Department if any problems should occur. I do not have any questions at this time, but understand that I may call the clinic during office hours   should I have any questions or need assistance in obtaining follow up care.    __________________________________________  _____________  __________ Signature of Patient or Authorized Representative            Date                   Time    __________________________________________ Nurse's Signature

## 2011-12-31 ENCOUNTER — Ambulatory Visit
Admission: RE | Admit: 2011-12-31 | Discharge: 2011-12-31 | Disposition: A | Discharge status: Home / Self Care | Payer: 59 | Source: Ambulatory Visit | Attending: Radiation Oncology | Admitting: Radiation Oncology

## 2011-12-31 NOTE — Progress Notes (Signed)
Mr. Salvia reports his throat continues to be sore and is probably sorer than it was the last time we spoke.  He reports he has a very good appetite.  Foods high in acid bother him, but he continues to tolerate V8 juice and buttermilk.  He is not using his PEG for nutrition support.  His weight has increased slightly to 175.7 pounds from 174.9 pounds on 09/27.  NUTRITION DIAGNOSIS:  Food and nutrition-related knowledge deficit improved.  INTERVENTION:  Mr. Gropper was educated to focus on continuing adequate calories and protein throughout the day to encourage weight maintenance. He is to continue to add oral nutrition supplements as needed.  The patient verbalizes willingness and understanding.  MONITORING/EVALUATION/GOALS:  The patient continues to tolerate oral diet for weight stability.  NEXT VISIT:  Monday, October 7th during chemotherapy.    ______________________________ Zenovia Jarred, RD, CSO, LDN Clinical Nutrition Specialist BN/MEDQ  D:  12/30/2011  T:  12/31/2011  Job:  [id number]

## 2012-01-01 ENCOUNTER — Ambulatory Visit
Admission: RE | Admit: 2012-01-01 | Discharge: 2012-01-01 | Disposition: A | Discharge status: Home / Self Care | Payer: 59 | Source: Ambulatory Visit | Attending: Radiation Oncology | Admitting: Radiation Oncology

## 2012-01-02 ENCOUNTER — Ambulatory Visit
Admission: RE | Admit: 2012-01-02 | Discharge: 2012-01-02 | Disposition: A | Discharge status: Home / Self Care | Payer: 59 | Source: Ambulatory Visit | Attending: Radiation Oncology | Admitting: Radiation Oncology

## 2012-01-03 ENCOUNTER — Other Ambulatory Visit (HOSPITAL_BASED_OUTPATIENT_CLINIC_OR_DEPARTMENT_OTHER): Payer: 59 | Admitting: Lab

## 2012-01-03 ENCOUNTER — Ambulatory Visit
Admission: RE | Admit: 2012-01-03 | Discharge: 2012-01-03 | Disposition: A | Discharge status: Home / Self Care | Payer: 59 | Source: Ambulatory Visit | Attending: Radiation Oncology | Admitting: Radiation Oncology

## 2012-01-03 ENCOUNTER — Ambulatory Visit (HOSPITAL_BASED_OUTPATIENT_CLINIC_OR_DEPARTMENT_OTHER): Payer: 59 | Admitting: Oncology

## 2012-01-03 VITALS — BP 129/88 | HR 70 | Temp 97.2°F | Resp 18 | Ht 67.0 in | Wt 170.3 lb

## 2012-01-03 DIAGNOSIS — E441 Mild protein-calorie malnutrition: Secondary | ICD-10-CM

## 2012-01-03 DIAGNOSIS — C099 Malignant neoplasm of tonsil, unspecified: Secondary | ICD-10-CM

## 2012-01-03 DIAGNOSIS — K121 Other forms of stomatitis: Secondary | ICD-10-CM

## 2012-01-03 DIAGNOSIS — I82409 Acute embolism and thrombosis of unspecified deep veins of unspecified lower extremity: Secondary | ICD-10-CM

## 2012-01-03 DIAGNOSIS — Z86718 Personal history of other venous thrombosis and embolism: Secondary | ICD-10-CM

## 2012-01-03 DIAGNOSIS — C119 Malignant neoplasm of nasopharynx, unspecified: Secondary | ICD-10-CM

## 2012-01-03 LAB — BASIC METABOLIC PANEL (CC13)
BUN: 19 mg/dL (ref 7.0–26.0)
Chloride: 103 mEq/L (ref 98–107)
Glucose: 88 mg/dl (ref 70–99)
Potassium: 4.2 mEq/L (ref 3.5–5.1)

## 2012-01-03 LAB — CBC WITH DIFFERENTIAL/PLATELET
Basophils Absolute: 0 10*3/uL (ref 0.0–0.1)
Eosinophils Absolute: 0 10*3/uL (ref 0.0–0.5)
HGB: 13.7 g/dL (ref 13.0–17.1)
MONO#: 0.4 10*3/uL (ref 0.1–0.9)
NEUT#: 5.6 10*3/uL (ref 1.5–6.5)
RDW: 14.8 % — ABNORMAL HIGH (ref 11.0–14.6)
lymph#: 0.3 10*3/uL — ABNORMAL LOW (ref 0.9–3.3)

## 2012-01-03 LAB — PROTIME-INR: INR: 3 (ref 2.00–3.50)

## 2012-01-03 NOTE — Progress Notes (Signed)
[facility]  Telephone:(336) [phone] Fax:(336) [phone]   OFFICE PROGRESS NOTE   Cc:  [name], MD  DIAGNOSIS: Recurrent T2 N2b M0 tonsillar cancer.   PAST THERAPY: He underwent in 2011 robotic resection with Dr. Hezzie Bump at [facility] in addition to left neck dissection. He was recommended to receive adjuvant radiation therapy. However, he declined at that time. He developed recurrent disease in May 2013. He received 2 cycles of induction chemo Taxotere, Cisplatin, and 5-FU in June and July 2013. He had very good partial response but grade 3 toxicity with anorexia, weight loss.   CURRENT THERAPY: Concurrent chemoradiation therapy with daily radiation and weekly Carboplatin and Taxol, started on 12/16/11.   INTERVAL HISTORY: Yacob Wilkerson 61 y.o. male returns for regular follow up with his wife.  He has mild fatigue; however, he was still able to mow the lawn yesterday.  He still has diplopia.  He denied head ache, seizure, confusion, dizziness, syncope.  In term of the therapy, he no longer feels any left neck mass.  He has some moon facies though.  He has some mild sore throat; he is doing mouth rinses with salt and baking soda about 3 times a day after meals. He does not need any pain medication. He is still able to take food by mouth. He uses clean water to rinse the PEG tube.  He denies any PEG tube pain, discharge, erythema. He denies fever, nausea vomiting, chest pain, shortness of breath, abdominal pain, dyspnea on exertion, bleeding symptom. He has not used biofene cream for the skin of his neck yet.  Past Medical History  Diagnosis Date  . Hypertension   . 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 GERD (gastroesophageal reflux disease) 10/15/2011  . Protein calorie malnutrition 10/15/2011  . Status post chemotherapy 10/04/11-10/13/11    taxotere/cisplatin/5FU  . Feeding by G-tube   . S/P radiation therapy 10/13/11 - 10/04/11  . Hx of sinus bradycardia    . DVT, bilateral lower limbs 10/29/11    Hx of  . Depression   . Tonsil cancer 12/04/2011    Past Surgical History  Procedure Date  . Splenectomy   . Tonsillectomy     one side    Current Outpatient Prescriptions  Medication Sig Dispense Refill  . dexamethasone (DECADRON) 4 MG tablet Take 2 mg by mouth daily.       . diphenhydrAMINE (BENADRYL) 25 MG tablet Take 25-50 mg by mouth daily as needed. For allergy      . emollient (BIAFINE) cream Apply 1 Tube topically 2 (two) times daily.      Marland Kitchen escitalopram (LEXAPRO) 5 MG tablet Take 1 tablet (5 mg total) by mouth every morning.  90 tablet  2  . ferrous sulfate 325 (65 FE) MG tablet Take 325 mg by mouth 3 (three) times daily with meals.       . fludrocortisone (FLORINEF) 0.1 MG tablet Take 1 tablet (0.1 mg total) by mouth daily.  90 tablet  2  . midodrine (PROAMATINE) 10 MG tablet Take 5 mg by mouth 3 (three) times daily.      . ondansetron (ZOFRAN) 8 MG tablet Take 1 tablet (8 mg total) by mouth 2 (two) times daily as needed (Nausea or vomiting).  30 tablet  1  . prochlorperazine (COMPAZINE) 10 MG tablet Take 1 tablet (10 mg total) by mouth every 6 (six) hours as needed (Nausea or vomiting).  30 tablet  1  .  warfarin (COUMADIN) 5 MG tablet Take 1 tablet (5 mg total) by mouth daily.  60 tablet  1  . Alum & Mag Hydroxide-Simeth (MAGIC MOUTHWASH W/LIDOCAINE) SOLN Take 5 mLs by mouth 4 (four) times daily. Swish and spit before meals and at bedtime.  500 mL  2  . Ensure Plus (ENSURE PLUS) LIQD Begin Ensure Plus 1/2 can via PEG QID with 120 ml free water before and after each bolus feeding.  If tolerated increase to 1 can of Ensure Plus QID on day 3 and 4.  If tolerated, increase to goal of 1.5 cans QID with 120 ml free water before and after each bolus.  In addition, pt is to drink or put an additional 240 ml free water in PEG daily.  Please send supplies and RN to home no later than Saturday, July 20th.  1422 mL  0  . hydrocodone-acetaminophen  (HYCET) 7.5-325 MG/15ML solution Take 15 mLs by mouth every 6 (six) hours as needed for pain.  500 mL  0    ALLERGIES:  is allergic to morphine and related.  REVIEW OF SYSTEMS:  The rest of the 14-point review of system was negative.   Filed Vitals:   01/03/12 0823  BP: 129/88  Pulse: 70  Temp: 97.2 F (36.2 C)  Resp: 18   Wt Readings from Last 3 Encounters:  01/03/12 170 lb 4.8 oz (77.248 kg)  12/30/11 175 lb 11.2 oz (79.697 kg)  12/27/11 174 lb 14.4 oz (79.334 kg)   ECOG Performance status: 0-1  PHYSICAL EXAMINATION:    General: well nourished man, in no acute distress. Eyes: no scleral icterus. ENT: There were no oropharyngeal lesions on my unaided exam. Neck was without thyromegaly. There was no longer fullness in the left cervical neck. Lymphatics: Negative cervical, supraclavicular or axillary adenopathy. Respiratory: lungs were clear bilaterally without wheezing or crackles. Cardiovascular: Regular rate and rhythm, S1/S2, without murmur, rub or gallop. There was no pedal edema. GI: abdomen was soft, flat, nontender, nondistended, without organomegaly.PEG tube was dry, clean, intact, nontender. Muscoloskeletal: no spinal tenderness of palpation of vertebral spine. Skin exam was without echymosis, petichae. Neuro exam:  He was able to have full range of EOM now. His tongue and uvula were slightly deviated to the left.  Patient was able to get on and off exam table without assistance. Patient was alerted and oriented x 4. Attention was good. Language was appropriate. Mood was normal without depression. Speech was not pressured. Thought content was not tangential.    LABORATORY/RADIOLOGY DATA:  Lab Results  Component Value Date   WBC 6.4 01/03/2012   HGB 13.7 01/03/2012   HCT 39.6 01/03/2012   PLT 206 01/03/2012   GLUCOSE 79 12/27/2011   CHOL 229* 08/29/2011   TRIG 143 08/29/2011   HDL 34* 08/29/2011   LDLCALC 166* 08/29/2011   ALKPHOS 60 12/13/2011   ALT 22 12/13/2011   AST 11  12/13/2011   NA 137 12/27/2011   K 4.5 12/27/2011   CL 102 12/27/2011   CREATININE 0.8 12/27/2011   BUN 20.0 12/27/2011   CO2 26 12/27/2011   INR 3.00 01/03/2012   HGBA1C 5.9* 08/28/2011      ASSESSMENT AND PLAN:   1. 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  - Impression: 2 cycles of neoadjuvant chemotherapy (without radiation) with very good response. He is s/p  3 cycles of weekly concurrent chemorad with Carboplatin/Taxol. He has grade 1 mucositis, grade 1 fatigue. He does not have dose limiting toxicity.  - Recommendation: Continue with concurrent weekly chemo Carboplatin/Taxol and daily radiation without dose modification.   2. Mucositis from chemorad:  - I advised him to continue salt/baking soda mouth rinse.  - I start him on Lortab elixir prn.  He has the prescription but he has not needed to take it yet.   3. Bilateral DVT. He is on Coumadin at 7.5mg  on Tue/Thu and 5mg  the rest of the week with therapeutic INR. I recommended to continue this without change today. Goal is 6 months total of anticoagulation until the end of Nov 2013 .   4. Mild protein calorie malnutrition. Slight weight loss.  I advised him to supplement his diet with about 1-2 cans of Ensure daily.  I advised him to continue flushing his PEG tube.  5 Mild deconditioning: He is able to ambulate and perform chores around the house.  6. 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 He has been dexamethasone 1 mg by mouth daily since the diagnosis of recurrence to decrease edema.  I decreased his Dexamethasone now to 1mg  PO every other day. When he is here next time, we can discontinue his Dexamethasone.  7. Bradycardia. Resolved.   8. Depression. He is on Lexapro 5 mg daily with stable  mood.  9.  Skin burn prophylaxis:  I advised him to start using Biofene cream since he will have skin burn soon from treatment.    10. Follow-up. Weekly while on chemorad.

## 2012-01-06 ENCOUNTER — Ambulatory Visit (HOSPITAL_BASED_OUTPATIENT_CLINIC_OR_DEPARTMENT_OTHER): Payer: PRIVATE HEALTH INSURANCE

## 2012-01-06 ENCOUNTER — Ambulatory Visit
Admission: RE | Admit: 2012-01-06 | Discharge: 2012-01-06 | Disposition: A | Discharge status: Home / Self Care | Payer: 59 | Source: Ambulatory Visit | Attending: Radiation Oncology | Admitting: Radiation Oncology

## 2012-01-06 ENCOUNTER — Encounter: Payer: Self-pay | Admitting: Radiation Oncology

## 2012-01-06 ENCOUNTER — Ambulatory Visit: Payer: Self-pay

## 2012-01-06 ENCOUNTER — Telehealth: Payer: Self-pay | Admitting: Oncology

## 2012-01-06 ENCOUNTER — Ambulatory Visit: Admission: RE | Admit: 2012-01-06 | Payer: 59 | Source: Ambulatory Visit | Admitting: Radiation Oncology

## 2012-01-06 ENCOUNTER — Ambulatory Visit: Payer: PRIVATE HEALTH INSURANCE | Admitting: Nutrition

## 2012-01-06 VITALS — BP 146/93 | HR 62 | Temp 98.1°F

## 2012-01-06 VITALS — BP 148/96 | HR 65 | Temp 99.1°F | Wt 171.1 lb

## 2012-01-06 DIAGNOSIS — C099 Malignant neoplasm of tonsil, unspecified: Secondary | ICD-10-CM

## 2012-01-06 DIAGNOSIS — C110 Malignant neoplasm of superior wall of nasopharynx: Secondary | ICD-10-CM

## 2012-01-06 DIAGNOSIS — C7800 Secondary malignant neoplasm of unspecified lung: Secondary | ICD-10-CM

## 2012-01-06 DIAGNOSIS — C119 Malignant neoplasm of nasopharynx, unspecified: Secondary | ICD-10-CM

## 2012-01-06 DIAGNOSIS — Z5111 Encounter for antineoplastic chemotherapy: Secondary | ICD-10-CM

## 2012-01-06 MED ORDER — SODIUM CHLORIDE 0.9 % IV SOLN
Freq: Once | INTRAVENOUS | Status: AC
  Administered 2012-01-06: 10:00:00 via INTRAVENOUS

## 2012-01-06 MED ORDER — PACLITAXEL CHEMO INJECTION 300 MG/50ML
45.0000 mg/m2 | Freq: Once | INTRAVENOUS | Status: AC
  Administered 2012-01-06: 84 mg via INTRAVENOUS
  Filled 2012-01-06: qty 14

## 2012-01-06 MED ORDER — DEXAMETHASONE SODIUM PHOSPHATE 4 MG/ML IJ SOLN
20.0000 mg | Freq: Once | INTRAMUSCULAR | Status: AC
  Administered 2012-01-06: 20 mg via INTRAVENOUS

## 2012-01-06 MED ORDER — HEPARIN SOD (PORK) LOCK FLUSH 100 UNIT/ML IV SOLN
500.0000 [IU] | Freq: Once | INTRAVENOUS | Status: AC | PRN
  Administered 2012-01-06: 500 [IU]
  Filled 2012-01-06: qty 5

## 2012-01-06 MED ORDER — ONDANSETRON 16 MG/50ML IVPB (CHCC)
16.0000 mg | Freq: Once | INTRAVENOUS | Status: AC
  Administered 2012-01-06: 16 mg via INTRAVENOUS

## 2012-01-06 MED ORDER — SODIUM CHLORIDE 0.9 % IJ SOLN
10.0000 mL | INTRAMUSCULAR | Status: DC | PRN
  Administered 2012-01-06: 10 mL
  Filled 2012-01-06: qty 10

## 2012-01-06 MED ORDER — SODIUM CHLORIDE 0.9 % IV SOLN
259.4000 mg | Freq: Once | INTRAVENOUS | Status: AC
  Administered 2012-01-06: 260 mg via INTRAVENOUS
  Filled 2012-01-06: qty 26

## 2012-01-06 MED ORDER — DIPHENHYDRAMINE HCL 50 MG/ML IJ SOLN
50.0000 mg | Freq: Once | INTRAMUSCULAR | Status: AC
  Administered 2012-01-06: 50 mg via INTRAVENOUS

## 2012-01-06 MED ORDER — FAMOTIDINE IN NACL 20-0.9 MG/50ML-% IV SOLN
20.0000 mg | Freq: Once | INTRAVENOUS | Status: AC
  Administered 2012-01-06: 20 mg via INTRAVENOUS

## 2012-01-06 NOTE — Patient Instructions (Addendum)
[facility] Discharge Instructions for Patients Receiving Chemotherapy  Today you received the following chemotherapy agents: Taxol, Carboplatin  To help prevent nausea and vomiting after your treatment, we encourage you to take your nausea medication as directed by your MD.  If you develop nausea and vomiting that is not controlled by your nausea medication, call the clinic. If it is after clinic hours your family physician or the after hours number for the clinic or go to the Emergency Department.   BELOW ARE SYMPTOMS THAT SHOULD BE REPORTED IMMEDIATELY:  *FEVER GREATER THAN 100.5 F  *CHILLS WITH OR WITHOUT FEVER  NAUSEA AND VOMITING THAT IS NOT CONTROLLED WITH YOUR NAUSEA MEDICATION  *UNUSUAL SHORTNESS OF BREATH  *UNUSUAL BRUISING OR BLEEDING  TENDERNESS IN MOUTH AND THROAT WITH OR WITHOUT PRESENCE OF ULCERS  *URINARY PROBLEMS  *BOWEL PROBLEMS  UNUSUAL RASH Items with * indicate a potential emergency and should be followed up as soon as possible.   Feel free to call the clinic you have any questions or concerns. The clinic phone number is [phone].    

## 2012-01-06 NOTE — Telephone Encounter (Signed)
Called pt and left message regarding time changed on 10/14 per Dr. Gaylyn Rong

## 2012-01-06 NOTE — Progress Notes (Signed)
Shane Marsh has received 15/33 fractions to the head and neck region.  He reports soreness of throat especially when he swallows and when he sneezes. Erythema noted on neck and lower face.

## 2012-01-06 NOTE — Progress Notes (Signed)
   Weekly Management Note Current Dose:  30 Gy  Projected Dose: 66 Gy   Narrative:  The patient presents for routine under treatment assessment.  CBCT/MVCT images/Port film x-rays were reviewed.  The chart was checked. Has esophagitis. Not severe. Meets with nutrition today.  Has not started using Magic mouthwash or Lortab elixir as prescribed. He has been using Biafine over his skin.   Physical Findings:  weight is 171 lb 1.6 oz (77.61 kg). His temperature is 99.1 F (37.3 C). His blood pressure is 148/96 and his pulse is 65.  patchy mucositis in the soft palate. No thrush. Erythematous skin over his neck.  CBC    Component Value Date/Time   WBC 6.4 01/03/2012 0804   WBC 38.7* 10/18/2011 1017   RBC 3.99* 01/03/2012 0804   RBC 3.78* 10/18/2011 1017   HGB 13.7 01/03/2012 0804   HGB 11.7* 10/18/2011 1017   HCT 39.6 01/03/2012 0804   HCT 33.6* 10/18/2011 1017   PLT 206 01/03/2012 0804   PLT 240 10/18/2011 1017   MCV 99.1* 01/03/2012 0804   MCV 88.9 10/18/2011 1017   MCH 34.3* 01/03/2012 0804   MCH 31.0 10/18/2011 1017   MCHC 34.6 01/03/2012 0804   MCHC 34.8 10/18/2011 1017   RDW 14.8* 01/03/2012 0804   RDW 15.8* 10/18/2011 1017   LYMPHSABS 0.3* 01/03/2012 0804   LYMPHSABS 1.9 09/11/2011 0030   MONOABS 0.4 01/03/2012 0804   MONOABS 1.3* 09/11/2011 0030   EOSABS 0.0 01/03/2012 0804   EOSABS 0.0 09/11/2011 0030   BASOSABS 0.0 01/03/2012 0804   BASOSABS 0.0 09/11/2011 0030    CMP     Component Value Date/Time   NA 137 01/03/2012 0804   NA 138 11/06/2011 1020   K 4.2 01/03/2012 0804   K 3.8 11/06/2011 1020   CL 103 01/03/2012 0804   CL 103 11/06/2011 1020   CO2 24 01/03/2012 0804   CO2 26 11/06/2011 1020   GLUCOSE 88 01/03/2012 0804   GLUCOSE 85 11/06/2011 1020   BUN 19.0 01/03/2012 0804   BUN 15 11/06/2011 1020   CREATININE 0.9 01/03/2012 0804   CREATININE 0.49* 11/06/2011 1020   CALCIUM 9.5 01/03/2012 0804   CALCIUM 8.7 11/06/2011 1020   PROT 6.0* 12/13/2011 1358   PROT 4.9* 11/06/2011 1020   ALBUMIN 3.2*  12/13/2011 1358   ALBUMIN 3.0* 11/06/2011 1020   AST 11 12/13/2011 1358   AST 17 11/06/2011 1020   ALT 22 12/13/2011 1358   ALT 40 11/06/2011 1020   ALKPHOS 60 12/13/2011 1358   ALKPHOS 70 11/06/2011 1020   BILITOT 0.50 12/13/2011 1358   BILITOT 0.3 11/06/2011 1020   GFRNONAA >90 09/20/2011 0443   GFRAA >90 09/20/2011 0443      Impression:  The patient is tolerating radiotherapy.  Plan:  Continue radiotherapy as planned. I informed the patient that his esophagitis is going to get significantly worse, but we will manage it as best we can. Explained once again how to use the Magic mouthwash similar to elixir when he is ready to do so. ________________________________   Lonie Peak, M.D.

## 2012-01-06 NOTE — Progress Notes (Signed)
I spoke with Mr. Jeancharles in the chemotherapy area. He reports that he thinks he is eating pretty well. He states he does use Ensure Plus once a day through his feeding tube. He is flushing the feeding tube with water before and after the bolus feeding. He states he tolerates soups and warm foods much better than other things. He does report some loose stool. His weight has declined to 171.1 pounds on October 7 from 175.7 pounds on October 1.  He has no other complaints at this time.  Nutrition diagnosis: Food and nutrition related knowledge deficit improved.  Intervention: I educated patient to increase Ensure Plus to twice a day to 3 times a day as needed to promote weight maintenance. I have encouraged him to drink this product by mouth if he is able. I have also educated him on strategies for increasing calories and proteins in soft moist foods. Patient verbalizes understanding and willingness to increase tube feeding as needed.  Monitoring, evaluation, goals: The patient is tolerating oral diet plus Ensure Plus via feeding tube however he has had a decline in his weight.  Next visit: Monday, October 21, during chemotherapy.

## 2012-01-06 NOTE — Patient Instructions (Signed)
Esophagitis  Esophagitis is inflammation of the esophagus. It can involve swelling, soreness, and pain in the esophagus. This condition can make it difficult and painful to swallow.  CAUSES   Most causes of esophagitis are not serious. Many different factors can cause esophagitis, including:   Gastroesophageal reflux disease (GERD). This is when acid from your stomach flows up into the esophagus.   Recurrent vomiting.   An allergic-type reaction.   Certain medicines, especially those that come in large pills.   Ingestion of harmful chemicals, such as household cleaning products.   Heavy alcohol use.   An infection of the esophagus.   Radiation treatment for cancer.   Certain diseases such as sarcoidosis, Crohn's disease, and scleroderma. These diseases may cause recurrent esophagitis.  SYMPTOMS    Trouble swallowing.   Painful swallowing.   Chest pain.   Difficulty breathing.   Nausea.   Vomiting.   Abdominal pain.  DIAGNOSIS   Your caregiver will take your history and do a physical exam. Depending upon what your caregiver finds, certain tests may also be done, including:   Barium X-ray. You will drink a solution that coats the esophagus, and X-rays will be taken.   Endoscopy. A lighted tube is put down the esophagus so your caregiver can examine the area.   Allergy tests. These can sometimes be arranged through follow-up visits.  TREATMENT   Treatment will depend on the cause of your esophagitis. In some cases, steroids or other medicines may be given to help relieve your symptoms or to treat the underlying cause of your condition. Medicines that may be recommended include:   Viscous lidocaine, to soothe the esophagus.   Antacids.   Acid reducers.   Proton pump inhibitors.   Antiviral medicines for certain viral infections of the esophagus.   Antifungal medicines for certain fungal infections of the esophagus.   Antibiotic medicines, depending on the cause of the esophagitis.  HOME CARE  INSTRUCTIONS    Avoid foods and drinks that seem to make your symptoms worse.   Eat small, frequent meals instead of large meals.   Avoid eating for the 3 hours prior to your bedtime.   If you have trouble taking pills, use a pill splitter to decrease the size and likelihood of the pill getting stuck or injuring the esophagus on the way down. Drinking water after taking a pill also helps.   Stop smoking if you smoke.   Maintain a healthy weight.   Wear loose-fitting clothing. Do not wear anything tight around your waist that causes pressure on your stomach.   Raise the head of your bed 6 to 8 inches with wood blocks to help you sleep. Extra pillows will not help.   Only take over-the-counter or prescription medicines as directed by your caregiver.  SEEK IMMEDIATE MEDICAL CARE IF:   You have severe chest pain that radiates into your arm, neck, or jaw.   You feel sweaty, dizzy, or lightheaded.   You have shortness of breath.   You vomit blood.   You have difficulty or pain with swallowing.   You have bloody or black, tarry stools.   You have a fever.   You have a burning sensation in the chest more than 3 times a week for more than 2 weeks.   You cannot swallow, drink, or eat.   You drool because you cannot swallow your saliva.  MAKE SURE YOU:   Understand these instructions.   Will watch your condition.   

## 2012-01-07 ENCOUNTER — Ambulatory Visit
Admission: RE | Admit: 2012-01-07 | Discharge: 2012-01-07 | Disposition: A | Discharge status: Home / Self Care | Payer: 59 | Source: Ambulatory Visit | Attending: Radiation Oncology | Admitting: Radiation Oncology

## 2012-01-08 ENCOUNTER — Ambulatory Visit
Admission: RE | Admit: 2012-01-08 | Discharge: 2012-01-08 | Disposition: A | Discharge status: Home / Self Care | Payer: 59 | Source: Ambulatory Visit | Attending: Radiation Oncology | Admitting: Radiation Oncology

## 2012-01-09 ENCOUNTER — Telehealth: Payer: Self-pay | Admitting: Oncology

## 2012-01-09 ENCOUNTER — Ambulatory Visit
Admission: RE | Admit: 2012-01-09 | Discharge: 2012-01-09 | Disposition: A | Discharge status: Home / Self Care | Payer: 59 | Source: Ambulatory Visit | Attending: Radiation Oncology | Admitting: Radiation Oncology

## 2012-01-09 NOTE — Telephone Encounter (Signed)
lvm for pt for appts for tomorrow and to pick up new schedule.

## 2012-01-10 ENCOUNTER — Telehealth: Payer: Self-pay | Admitting: *Deleted

## 2012-01-10 ENCOUNTER — Telehealth: Payer: Self-pay | Admitting: Oncology

## 2012-01-10 ENCOUNTER — Other Ambulatory Visit (HOSPITAL_BASED_OUTPATIENT_CLINIC_OR_DEPARTMENT_OTHER): Payer: 59 | Admitting: Lab

## 2012-01-10 ENCOUNTER — Ambulatory Visit
Admission: RE | Admit: 2012-01-10 | Discharge: 2012-01-10 | Disposition: A | Discharge status: Home / Self Care | Payer: 59 | Source: Ambulatory Visit | Attending: Radiation Oncology | Admitting: Radiation Oncology

## 2012-01-10 ENCOUNTER — Ambulatory Visit (HOSPITAL_BASED_OUTPATIENT_CLINIC_OR_DEPARTMENT_OTHER): Payer: PRIVATE HEALTH INSURANCE | Admitting: Oncology

## 2012-01-10 VITALS — BP 141/94 | HR 67 | Temp 98.3°F | Resp 20 | Ht 67.0 in | Wt 168.3 lb

## 2012-01-10 DIAGNOSIS — K1230 Oral mucositis (ulcerative), unspecified: Secondary | ICD-10-CM

## 2012-01-10 DIAGNOSIS — I82409 Acute embolism and thrombosis of unspecified deep veins of unspecified lower extremity: Secondary | ICD-10-CM

## 2012-01-10 DIAGNOSIS — C099 Malignant neoplasm of tonsil, unspecified: Secondary | ICD-10-CM

## 2012-01-10 DIAGNOSIS — F411 Generalized anxiety disorder: Secondary | ICD-10-CM

## 2012-01-10 LAB — PROTIME-INR
INR: 4.4 — ABNORMAL HIGH (ref 2.00–3.50)
Protime: 52.8 Seconds — ABNORMAL HIGH (ref 10.6–13.4)

## 2012-01-10 LAB — CBC WITH DIFFERENTIAL/PLATELET
Basophils Absolute: 0 10*3/uL (ref 0.0–0.1)
EOS%: 0.7 % (ref 0.0–7.0)
Eosinophils Absolute: 0 10*3/uL (ref 0.0–0.5)
HGB: 13.7 g/dL (ref 13.0–17.1)
MCV: 98.6 fL — ABNORMAL HIGH (ref 79.3–98.0)
MONO%: 8.5 % (ref 0.0–14.0)
NEUT#: 4 10*3/uL (ref 1.5–6.5)
RBC: 4.02 10*6/uL — ABNORMAL LOW (ref 4.20–5.82)
RDW: 15.1 % — ABNORMAL HIGH (ref 11.0–14.6)
lymph#: 0.5 10*3/uL — ABNORMAL LOW (ref 0.9–3.3)

## 2012-01-10 LAB — BASIC METABOLIC PANEL (CC13)
Chloride: 102 mEq/L (ref 98–107)
Glucose: 85 mg/dl (ref 70–99)
Potassium: 3.8 mEq/L (ref 3.5–5.1)
Sodium: 138 mEq/L (ref 136–145)

## 2012-01-10 NOTE — Telephone Encounter (Signed)
Added lb/fu for 10/21 @ 8:15 am. appts scheduled for 8:15 am due next available appt after xrt would leave pt waiting an hr between appts. Note added to f/u that pt has xrt @ 9:10 am. S/w pt wife re appts for 10/21.

## 2012-01-10 NOTE — Patient Instructions (Signed)
1. Head/neck cancer;  Continue chemoradiation. 2. Blood clot:  Decrease Coumadin to

## 2012-01-10 NOTE — Telephone Encounter (Signed)
Called [facility] to get pt scheduled to see Verdie Mosher asap on Mon 10/14 if possible.  VM from [facility] he may be able to see pt on Monday afternoon and Angie at their clinic will call pt to arrange appointment.

## 2012-01-10 NOTE — Progress Notes (Signed)
[facility]  [phone]) [phone] [phone]   OFFICE PROGRESS NOTE   Cc:  [name], MD  DIAGNOSIS: Recurrent T2 N2b M0 tonsillar cancer.   PAST THERAPY: He underwent in 2011 robotic resection with Dr. Hezzie Bump at [facility] in addition to left neck dissection. He was recommended to receive adjuvant radiation therapy. However, he declined at that time. He developed recurrent disease in May 2013. He received 2 cycles of induction chemo Taxotere, Cisplatin, and 5-FU in June and July 2013. He had very good partial response but grade 3 toxicity with anorexia, weight loss.   CURRENT THERAPY: Concurrent chemoradiation therapy with daily radiation and weekly Carboplatin and Taxol, started on 12/16/11.  INTERVAL HISTORY: Shane Marsh 61 y.o. male returns for regular follow up with his wife.  He reports doing relatively well.  He has been having slight mucositis and xerostomia/thick phlegm.  He has not needed pain med Vicodin on a regular basis.  He has been doing salt/baking soda mouth rinses.  He has not started diluted H2O2 yet.  He is eating less foods by mouth now due to mucositis, lack of taste.  He has not gone to speech pathology class nor has he been doing swallowing exercises.  He uses about 2-3 cans of boost/Ensure via PEG a day.  PEG tube is clean without pain, erythema, or discharge. He still has diplopia.  He no longer has dizziness or syncope.  He is very active around the house.   Patient denies fever, fatigue, headache, visual changes, confusion, drenching night sweats, palpable lymph node swelling, odynophagia, dysphagia, nausea vomiting, jaundice, chest pain, palpitation, shortness of breath, dyspnea on exertion, productive cough, gum bleeding, epistaxis, hematemesis, hemoptysis, abdominal pain, abdominal swelling, early satiety, melena, hematochezia, hematuria, skin rash, spontaneous bleeding, joint swelling, joint pain, heat or cold intolerance, bowel  bladder incontinence, back pain, focal motor weakness, paresthesia, depression, suicidal or homicidal ideation, feeling hopelessness.   Past Medical History  Diagnosis Date  . Hypertension   . 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 GERD (gastroesophageal reflux disease) 10/15/2011  . Protein calorie malnutrition 10/15/2011  . Status post chemotherapy 10/04/11-10/13/11    taxotere/cisplatin/5FU  . Feeding by G-tube   . S/P radiation therapy 10/13/11 - 10/04/11  . Hx of sinus bradycardia   . DVT, bilateral lower limbs 10/29/11    Hx of  . Depression   . Tonsil cancer 12/04/2011    Past Surgical History  Procedure Date  . Splenectomy   . Tonsillectomy     one side    Current Outpatient Prescriptions  Medication Sig Dispense Refill  . Alum & Mag Hydroxide-Simeth (MAGIC MOUTHWASH W/LIDOCAINE) SOLN Take 5 mLs by mouth 4 (four) times daily. Swish and spit before meals and at bedtime.  500 mL  2  . dexamethasone (DECADRON) 4 MG tablet Take 1 mg by mouth every other day.       . diphenhydrAMINE (BENADRYL) 25 MG tablet Take 25-50 mg by mouth daily as needed. For allergy      . emollient (BIAFINE) cream Apply 1 Tube topically 2 (two) times daily.      . Ensure Plus (ENSURE PLUS) LIQD Begin Ensure Plus 1/2 can via PEG QID with 120 ml free water before and after each bolus feeding.  If tolerated increase to 1 can of Ensure Plus QID on day 3 and 4.  If tolerated, increase to goal of 1.5 cans QID with 120 ml free water  before and after each bolus.  In addition, pt is to drink or put an additional 240 ml free water in PEG daily.  Please send supplies and RN to home no later than Saturday, July 20th.  1422 mL  0  . escitalopram (LEXAPRO) 5 MG tablet Take 1 tablet (5 mg total) by mouth every morning.  90 tablet  2  . ferrous sulfate 325 (65 FE) MG tablet Take 325 mg by mouth 3 (three) times daily with meals.       . fludrocortisone (FLORINEF) 0.1 MG tablet Take 1 tablet (0.1 mg total) by  mouth daily.  90 tablet  2  . hydrocodone-acetaminophen (HYCET) 7.5-325 MG/15ML solution Take 15 mLs by mouth every 6 (six) hours as needed for pain.  500 mL  0  . midodrine (PROAMATINE) 10 MG tablet Take 5 mg by mouth 3 (three) times daily.      . ondansetron (ZOFRAN) 8 MG tablet Take 1 tablet (8 mg total) by mouth 2 (two) times daily as needed (Nausea or vomiting).  30 tablet  1  . prochlorperazine (COMPAZINE) 10 MG tablet Take 1 tablet (10 mg total) by mouth every 6 (six) hours as needed (Nausea or vomiting).  30 tablet  1  . warfarin (COUMADIN) 5 MG tablet Take 1 tablet (5 mg total) by mouth daily.  60 tablet  1    ALLERGIES:  is allergic to morphine and related.  REVIEW OF SYSTEMS:  The rest of the 14-point review of system was negative.   Filed Vitals:   01/10/12 0953  BP: 141/94  Pulse: 67  Temp: 98.3 F (36.8 C)  Resp: 20   Wt Readings from Last 3 Encounters:  01/10/12 168 lb 4.8 oz (76.34 kg)  01/06/12 171 lb 1.6 oz (77.61 kg)  01/03/12 170 lb 4.8 oz (77.248 kg)   ECOG Performance status: 1  PHYSICAL EXAMINATION:    General: well nourished man, in no acute distress. Eyes: no scleral icterus. ENT: There were no oropharyngeal lesions on my unaided exam. Neck was without thyromegaly. There was no longer fullness in the left cervical neck. Lymphatics: Negative cervical, supraclavicular or axillary adenopathy. Respiratory: lungs were clear bilaterally without wheezing or crackles. Cardiovascular: Regular rate and rhythm, S1/S2, without murmur, rub or gallop. There was no pedal edema. GI: abdomen was soft, flat, nontender, nondistended, without organomegaly.PEG tube was dry, clean, intact, nontender. Muscoloskeletal: no spinal tenderness of palpation of vertebral spine. Skin exam was without echymosis, petichae.  He was alert and oriented.  Attention was good. Language was appropriate. Mood was normal without depression. Speech was not pressured. Thought content was not tangential.          LABORATORY/RADIOLOGY DATA:  Lab Results  Component Value Date   WBC 5.0 01/10/2012   HGB 13.7 01/10/2012   HCT 39.7 01/10/2012   PLT 197 01/10/2012   GLUCOSE 85 01/10/2012   CHOL 229* 08/29/2011   TRIG 143 08/29/2011   HDL 34* 08/29/2011   LDLCALC 166* 08/29/2011   ALKPHOS 60 12/13/2011   ALT 22 12/13/2011   AST 11 12/13/2011   NA 138 01/10/2012   K 3.8 01/10/2012   CL 102 01/10/2012   CREATININE 0.8 01/10/2012   BUN 17.0 01/10/2012   CO2 24 01/10/2012   INR 4.40* 01/10/2012   HGBA1C 5.9* 08/28/2011     ASSESSMENT AND PLAN:    1. 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  - Impression: 2 cycles of neoadjuvant chemotherapy (without radiation) with very good response. He is s/p 4 cycles of weekly concurrent chemorad with Carboplatin/Taxol. He has grade 1-2 mucositis, grade 1 fatigue, grade 1 weight loss. He does not have dose limiting toxicity.  - Recommendation: Continue with concurrent weekly chemo Carboplatin/Taxol and daily radiation without dose modification.  2. Mucositis from chemorad:  - I advised him to continue salt/baking soda mouth rinse.   I advised him to start diluted H2O2 (1:4) for thick phlegm.  If this burns, we can try Lidocaine:Robitussin combo.  - I gave him Lortab elixir prn. He has the prescription but he has not needed to take it yet.  - I advised him the importance of swallowing exercise.  At first, he refused.  But I tried to convince him.  I personally made arrangement for him to see Speech Path when he is here for chemo.  3. Bilateral DVT. He is on Coumadin at 7.5mg  on Tue/Thu and 5mg  the rest of the week with therapeutic INR. His INR is 4.4.  I advised him to decrease Coumadin to 5mg  PO daily.  I will recheck his INR with next visit.  Goal is 6 months total of  anticoagulation until the end of Nov 2013 .  4. Mild protein calorie malnutrition. Slight weight loss. I advised him to supplement his diet with about 3-4 cans of Ensure until he is seen by Vernell Leep.  5 Mild deconditioning: He is able to ambulate and perform chores around the house.  6. 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 He has been dexamethasone 1 mg by mouth daily since the diagnosis of recurrence to decrease edema. I decreased his Dexamethasone now to 1mg  PO every other day. I advised him to stop Dexamethasone now.  7. Bradycardia. Resolved.  8. Depression. He is on Lexapro 5 mg daily with stable mood.  9. Skin burn prophylaxis: I advised him to continue Biofene cream since he will have skin burn soon from treatment.  10.  Follow up:  In about 10 days.

## 2012-01-13 ENCOUNTER — Other Ambulatory Visit: Payer: Self-pay | Admitting: Lab

## 2012-01-13 ENCOUNTER — Ambulatory Visit: Payer: Self-pay | Admitting: Oncology

## 2012-01-13 ENCOUNTER — Other Ambulatory Visit: Payer: Self-pay | Admitting: Oncology

## 2012-01-13 ENCOUNTER — Ambulatory Visit
Admission: RE | Admit: 2012-01-13 | Discharge: 2012-01-13 | Disposition: A | Discharge status: Home / Self Care | Payer: 59 | Source: Ambulatory Visit | Attending: Radiation Oncology | Admitting: Radiation Oncology

## 2012-01-13 ENCOUNTER — Ambulatory Visit (HOSPITAL_BASED_OUTPATIENT_CLINIC_OR_DEPARTMENT_OTHER): Payer: PRIVATE HEALTH INSURANCE

## 2012-01-13 ENCOUNTER — Encounter: Payer: Self-pay | Admitting: Nutrition

## 2012-01-13 ENCOUNTER — Encounter: Payer: Self-pay | Admitting: Radiation Oncology

## 2012-01-13 VITALS — BP 148/93 | HR 62 | Temp 98.0°F | Resp 17

## 2012-01-13 DIAGNOSIS — C099 Malignant neoplasm of tonsil, unspecified: Secondary | ICD-10-CM

## 2012-01-13 DIAGNOSIS — C119 Malignant neoplasm of nasopharynx, unspecified: Secondary | ICD-10-CM

## 2012-01-13 DIAGNOSIS — Z5111 Encounter for antineoplastic chemotherapy: Secondary | ICD-10-CM

## 2012-01-13 MED ORDER — DEXAMETHASONE SODIUM PHOSPHATE 4 MG/ML IJ SOLN
20.0000 mg | Freq: Once | INTRAMUSCULAR | Status: AC
  Administered 2012-01-13: 20 mg via INTRAVENOUS

## 2012-01-13 MED ORDER — ONDANSETRON 16 MG/50ML IVPB (CHCC)
16.0000 mg | Freq: Once | INTRAVENOUS | Status: AC
  Administered 2012-01-13: 16 mg via INTRAVENOUS

## 2012-01-13 MED ORDER — HEPARIN SOD (PORK) LOCK FLUSH 100 UNIT/ML IV SOLN
500.0000 [IU] | Freq: Once | INTRAVENOUS | Status: AC | PRN
  Administered 2012-01-13: 500 [IU]
  Filled 2012-01-13: qty 5

## 2012-01-13 MED ORDER — PACLITAXEL CHEMO INJECTION 300 MG/50ML
45.0000 mg/m2 | Freq: Once | INTRAVENOUS | Status: AC
  Administered 2012-01-13: 84 mg via INTRAVENOUS
  Filled 2012-01-13: qty 14

## 2012-01-13 MED ORDER — SODIUM CHLORIDE 0.9 % IV SOLN
259.4000 mg | Freq: Once | INTRAVENOUS | Status: AC
  Administered 2012-01-13: 260 mg via INTRAVENOUS
  Filled 2012-01-13: qty 26

## 2012-01-13 MED ORDER — DIPHENHYDRAMINE HCL 50 MG/ML IJ SOLN
50.0000 mg | Freq: Once | INTRAMUSCULAR | Status: AC
  Administered 2012-01-13: 50 mg via INTRAVENOUS

## 2012-01-13 MED ORDER — SODIUM CHLORIDE 0.9 % IV SOLN
Freq: Once | INTRAVENOUS | Status: AC
  Administered 2012-01-13: 11:00:00 via INTRAVENOUS

## 2012-01-13 MED ORDER — FAMOTIDINE IN NACL 20-0.9 MG/50ML-% IV SOLN
20.0000 mg | Freq: Once | INTRAVENOUS | Status: AC
  Administered 2012-01-13: 20 mg via INTRAVENOUS

## 2012-01-13 MED ORDER — SODIUM CHLORIDE 0.9 % IJ SOLN
10.0000 mL | INTRAMUSCULAR | Status: DC | PRN
  Administered 2012-01-13: 10 mL
  Filled 2012-01-13: qty 10

## 2012-01-13 NOTE — Patient Instructions (Addendum)
[facility] Discharge Instructions for Patients Receiving Chemotherapy  Today you received the following chemotherapy agents Taxol and Carboplatin.  To help prevent nausea and vomiting after your treatment, we encourage you to take your nausea medication. Begin taking your nausea medication as often as prescribed for Dr. Gaylyn Rong.    If you develop nausea and vomiting that is not controlled by your nausea medication, call the clinic. If it is after clinic hours your family physician or the after hours number for the clinic or go to the Emergency Department.   BELOW ARE SYMPTOMS THAT SHOULD BE REPORTED IMMEDIATELY:  *FEVER GREATER THAN 100.5 F  *CHILLS WITH OR WITHOUT FEVER  NAUSEA AND VOMITING THAT IS NOT CONTROLLED WITH YOUR NAUSEA MEDICATION  *UNUSUAL SHORTNESS OF BREATH  *UNUSUAL BRUISING OR BLEEDING  TENDERNESS IN MOUTH AND THROAT WITH OR WITHOUT PRESENCE OF ULCERS  *URINARY PROBLEMS  *BOWEL PROBLEMS  UNUSUAL RASH Items with * indicate a potential emergency and should be followed up as soon as possible.  One of the nurses will contact you 24 hours after your treatment. Please let the nurse know about any problems that you may have experienced. Feel free to call the clinic you have any questions or concerns. The clinic phone number is [phone].   I have been informed and understand all the instructions given to me. I know to contact the clinic, my physician, or go to the Emergency Department if any problems should occur. I do not have any questions at this time, but understand that I may call the clinic during office hours   should I have any questions or need assistance in obtaining follow up care.    __________________________________________  _____________  __________ Signature of Patient or Authorized Representative            Date                   Time    __________________________________________ Nurse's Signature

## 2012-01-13 NOTE — Progress Notes (Signed)
   Weekly Management Note Current Dose:  40 Gy  Projected Dose: 66 Gy   Narrative:  The patient presents for routine under treatment assessment.  CBCT/MVCT images/Port film x-rays were reviewed.  The chart was checked. Taking 2 cans of shakes /day per PEG. Lidocaine mouthwash helps his throat pain.  Biafene helping skin.  Physical Findings:  vitals were not taken for this visit. physical exam deferred until tomorrow.  CBC    Component Value Date/Time   WBC 5.0 01/10/2012 0842   WBC 38.7* 10/18/2011 1017   RBC 4.02* 01/10/2012 0842   RBC 3.78* 10/18/2011 1017   HGB 13.7 01/10/2012 0842   HGB 11.7* 10/18/2011 1017   HCT 39.7 01/10/2012 0842   HCT 33.6* 10/18/2011 1017   PLT 197 01/10/2012 0842   PLT 240 10/18/2011 1017   MCV 98.6* 01/10/2012 0842   MCV 88.9 10/18/2011 1017   MCH 34.2* 01/10/2012 0842   MCH 31.0 10/18/2011 1017   MCHC 34.7 01/10/2012 0842   MCHC 34.8 10/18/2011 1017   RDW 15.1* 01/10/2012 0842   RDW 15.8* 10/18/2011 1017   LYMPHSABS 0.5* 01/10/2012 0842   LYMPHSABS 1.9 09/11/2011 0030   MONOABS 0.4 01/10/2012 0842   MONOABS 1.3* 09/11/2011 0030   EOSABS 0.0 01/10/2012 0842   EOSABS 0.0 09/11/2011 0030   BASOSABS 0.0 01/10/2012 0842   BASOSABS 0.0 09/11/2011 0030    CMP     Component Value Date/Time   NA 138 01/10/2012 0842   NA 138 11/06/2011 1020   K 3.8 01/10/2012 0842   K 3.8 11/06/2011 1020   CL 102 01/10/2012 0842   CL 103 11/06/2011 1020   CO2 24 01/10/2012 0842   CO2 26 11/06/2011 1020   GLUCOSE 85 01/10/2012 0842   GLUCOSE 85 11/06/2011 1020   BUN 17.0 01/10/2012 0842   BUN 15 11/06/2011 1020   CREATININE 0.8 01/10/2012 0842   CREATININE 0.49* 11/06/2011 1020   CALCIUM 9.3 01/10/2012 0842   CALCIUM 8.7 11/06/2011 1020   PROT 6.0* 12/13/2011 1358   PROT 4.9* 11/06/2011 1020   ALBUMIN 3.2* 12/13/2011 1358   ALBUMIN 3.0* 11/06/2011 1020   AST 11 12/13/2011 1358   AST 17 11/06/2011 1020   ALT 22 12/13/2011 1358   ALT 40 11/06/2011 1020   ALKPHOS 60 12/13/2011 1358   ALKPHOS 70 11/06/2011 1020   BILITOT 0.50 12/13/2011 1358   BILITOT 0.3 11/06/2011 1020   GFRNONAA >90 09/20/2011 0443   GFRAA >90 09/20/2011 0443     Impression:  The patient is tolerating radiotherapy.  Plan:  Continue radiotherapy as planned.  ________________________________   Lonie Peak, M.D.

## 2012-01-14 ENCOUNTER — Ambulatory Visit
Admission: RE | Admit: 2012-01-14 | Discharge: 2012-01-14 | Disposition: A | Discharge status: Home / Self Care | Payer: 59 | Source: Ambulatory Visit | Attending: Radiation Oncology | Admitting: Radiation Oncology

## 2012-01-14 ENCOUNTER — Encounter: Payer: Self-pay | Admitting: Radiation Oncology

## 2012-01-14 VITALS — BP 148/93 | HR 70 | Temp 98.2°F | Resp 20 | Wt 167.0 lb

## 2012-01-14 DIAGNOSIS — C119 Malignant neoplasm of nasopharynx, unspecified: Secondary | ICD-10-CM

## 2012-01-14 DIAGNOSIS — C099 Malignant neoplasm of tonsil, unspecified: Secondary | ICD-10-CM

## 2012-01-14 NOTE — Progress Notes (Signed)
Patient here weekly rad txs, 21/23 completed, left side neck with erythema, skin intact, uses biafine 1-2x day, c/o increased sore throat sine yesterday, no c/o n,v, , 1 can ensure via peg daily, says eating without swallowing difficulty 9:29 AM

## 2012-01-14 NOTE — Progress Notes (Signed)
   Weekly Management Note Current Dose:  42 Gy  Projected Dose: 66 Gy   Narrative:  The patient presents for routine under treatment assessment.  CBCT/MVCT images/Port film x-rays were reviewed.  The chart was checked. He is doing about the same as yesterday. He is taking supplemental nutrition through his PEG but also swallowing; he has not seen the swallowing therapist recently but has an appointment with him next week, he believes. He is somewhat reluctant to attend this appointment as he is not sure how much his insurance will cover it. I reiterated the reasons to attend swallowing therapy. He understands that the risk of chronic dysphagia is heightened if he does not attend these appointments with swallowing therapy. This has also been discussed in medical oncology. He reports esophagitis which is relieved by Magic mouthwash with lidocaine. He is also taking Lortab elixir.  Physical Findings:  weight is 167 lb (75.751 kg). His oral temperature is 98.2 F (36.8 C). His blood pressure is 148/93 and his pulse is 70. His respiration is 20.  bilateral erythema over his neck. Patchy mucositis in his oropharynx. No thrush.  CBC    Component Value Date/Time   WBC 5.0 01/10/2012 0842   WBC 38.7* 10/18/2011 1017   RBC 4.02* 01/10/2012 0842   RBC 3.78* 10/18/2011 1017   HGB 13.7 01/10/2012 0842   HGB 11.7* 10/18/2011 1017   HCT 39.7 01/10/2012 0842   HCT 33.6* 10/18/2011 1017   PLT 197 01/10/2012 0842   PLT 240 10/18/2011 1017   MCV 98.6* 01/10/2012 0842   MCV 88.9 10/18/2011 1017   MCH 34.2* 01/10/2012 0842   MCH 31.0 10/18/2011 1017   MCHC 34.7 01/10/2012 0842   MCHC 34.8 10/18/2011 1017   RDW 15.1* 01/10/2012 0842   RDW 15.8* 10/18/2011 1017   LYMPHSABS 0.5* 01/10/2012 0842   LYMPHSABS 1.9 09/11/2011 0030   MONOABS 0.4 01/10/2012 0842   MONOABS 1.3* 09/11/2011 0030   EOSABS 0.0 01/10/2012 0842   EOSABS 0.0 09/11/2011 0030   BASOSABS 0.0 01/10/2012 0842   BASOSABS 0.0 09/11/2011 0030     CMP     Component Value Date/Time   NA 138 01/10/2012 0842   NA 138 11/06/2011 1020   K 3.8 01/10/2012 0842   K 3.8 11/06/2011 1020   CL 102 01/10/2012 0842   CL 103 11/06/2011 1020   CO2 24 01/10/2012 0842   CO2 26 11/06/2011 1020   GLUCOSE 85 01/10/2012 0842   GLUCOSE 85 11/06/2011 1020   BUN 17.0 01/10/2012 0842   BUN 15 11/06/2011 1020   CREATININE 0.8 01/10/2012 0842   CREATININE 0.49* 11/06/2011 1020   CALCIUM 9.3 01/10/2012 0842   CALCIUM 8.7 11/06/2011 1020   PROT 6.0* 12/13/2011 1358   PROT 4.9* 11/06/2011 1020   ALBUMIN 3.2* 12/13/2011 1358   ALBUMIN 3.0* 11/06/2011 1020   AST 11 12/13/2011 1358   AST 17 11/06/2011 1020   ALT 22 12/13/2011 1358   ALT 40 11/06/2011 1020   ALKPHOS 60 12/13/2011 1358   ALKPHOS 70 11/06/2011 1020   BILITOT 0.50 12/13/2011 1358   BILITOT 0.3 11/06/2011 1020   GFRNONAA >90 09/20/2011 0443   GFRAA >90 09/20/2011 0443    Impression:  The patient is tolerating radiotherapy.  Plan:  Continue radiotherapy as planned. I explained that next week, he will see my partner while I am on vacation  ________________________________   Lonie Peak, M.D.

## 2012-01-15 ENCOUNTER — Ambulatory Visit
Admission: RE | Admit: 2012-01-15 | Discharge: 2012-01-15 | Disposition: A | Discharge status: Home / Self Care | Payer: 59 | Source: Ambulatory Visit | Attending: Radiation Oncology | Admitting: Radiation Oncology

## 2012-01-16 ENCOUNTER — Ambulatory Visit
Admission: RE | Admit: 2012-01-16 | Discharge: 2012-01-16 | Disposition: A | Discharge status: Home / Self Care | Payer: 59 | Source: Ambulatory Visit | Attending: Radiation Oncology | Admitting: Radiation Oncology

## 2012-01-17 ENCOUNTER — Ambulatory Visit
Admission: RE | Admit: 2012-01-17 | Discharge: 2012-01-17 | Disposition: A | Discharge status: Home / Self Care | Payer: 59 | Source: Ambulatory Visit | Attending: Radiation Oncology | Admitting: Radiation Oncology

## 2012-01-20 ENCOUNTER — Ambulatory Visit
Admission: RE | Admit: 2012-01-20 | Discharge: 2012-01-20 | Disposition: A | Discharge status: Home / Self Care | Payer: 59 | Source: Ambulatory Visit | Attending: Radiation Oncology | Admitting: Radiation Oncology

## 2012-01-20 ENCOUNTER — Telehealth: Payer: Self-pay | Admitting: Oncology

## 2012-01-20 ENCOUNTER — Encounter: Payer: Self-pay | Admitting: Oncology

## 2012-01-20 ENCOUNTER — Other Ambulatory Visit (HOSPITAL_BASED_OUTPATIENT_CLINIC_OR_DEPARTMENT_OTHER): Payer: PRIVATE HEALTH INSURANCE

## 2012-01-20 ENCOUNTER — Encounter: Payer: Self-pay | Admitting: Radiation Oncology

## 2012-01-20 ENCOUNTER — Ambulatory Visit: Payer: PRIVATE HEALTH INSURANCE | Admitting: Nutrition

## 2012-01-20 ENCOUNTER — Ambulatory Visit (HOSPITAL_BASED_OUTPATIENT_CLINIC_OR_DEPARTMENT_OTHER): Payer: PRIVATE HEALTH INSURANCE

## 2012-01-20 ENCOUNTER — Ambulatory Visit (HOSPITAL_BASED_OUTPATIENT_CLINIC_OR_DEPARTMENT_OTHER): Payer: PRIVATE HEALTH INSURANCE | Admitting: Oncology

## 2012-01-20 VITALS — BP 135/89 | HR 73 | Temp 97.5°F | Resp 20 | Ht 67.0 in | Wt 164.9 lb

## 2012-01-20 VITALS — BP 132/97 | HR 73 | Temp 98.3°F | Wt 165.2 lb

## 2012-01-20 DIAGNOSIS — K121 Other forms of stomatitis: Secondary | ICD-10-CM

## 2012-01-20 DIAGNOSIS — C099 Malignant neoplasm of tonsil, unspecified: Secondary | ICD-10-CM

## 2012-01-20 DIAGNOSIS — C119 Malignant neoplasm of nasopharynx, unspecified: Secondary | ICD-10-CM

## 2012-01-20 DIAGNOSIS — Z5111 Encounter for antineoplastic chemotherapy: Secondary | ICD-10-CM

## 2012-01-20 DIAGNOSIS — I82409 Acute embolism and thrombosis of unspecified deep veins of unspecified lower extremity: Secondary | ICD-10-CM

## 2012-01-20 DIAGNOSIS — B370 Candidal stomatitis: Secondary | ICD-10-CM

## 2012-01-20 LAB — COMPREHENSIVE METABOLIC PANEL (CC13)
CO2: 26 mEq/L (ref 22–29)
Calcium: 9.4 mg/dL (ref 8.4–10.4)
Chloride: 100 mEq/L (ref 98–107)
Creatinine: 0.7 mg/dL (ref 0.7–1.3)
Glucose: 97 mg/dl (ref 70–99)
Total Bilirubin: 0.5 mg/dL (ref 0.20–1.20)

## 2012-01-20 LAB — CBC WITH DIFFERENTIAL/PLATELET
Basophils Absolute: 0.1 10*3/uL (ref 0.0–0.1)
Eosinophils Absolute: 0.1 10*3/uL (ref 0.0–0.5)
HCT: 39.4 % (ref 38.4–49.9)
HGB: 13.3 g/dL (ref 13.0–17.1)
LYMPH%: 14.2 % (ref 14.0–49.0)
MCV: 97 fL (ref 79.3–98.0)
MONO#: 0.5 10*3/uL (ref 0.1–0.9)
MONO%: 13.3 % (ref 0.0–14.0)
NEUT#: 2.4 10*3/uL (ref 1.5–6.5)
NEUT%: 69.1 % (ref 39.0–75.0)
Platelets: 216 10*3/uL (ref 140–400)

## 2012-01-20 LAB — PROTIME-INR
INR: 3.3 (ref 2.00–3.50)
Protime: 39.6 Seconds — ABNORMAL HIGH (ref 10.6–13.4)

## 2012-01-20 MED ORDER — SODIUM CHLORIDE 0.9 % IV SOLN
Freq: Once | INTRAVENOUS | Status: AC
  Administered 2012-01-20: 11:00:00 via INTRAVENOUS

## 2012-01-20 MED ORDER — SODIUM CHLORIDE 0.9 % IV SOLN
259.4000 mg | Freq: Once | INTRAVENOUS | Status: AC
  Administered 2012-01-20: 260 mg via INTRAVENOUS
  Filled 2012-01-20: qty 26

## 2012-01-20 MED ORDER — SODIUM CHLORIDE 0.9 % IJ SOLN
10.0000 mL | INTRAMUSCULAR | Status: DC | PRN
  Administered 2012-01-20: 10 mL
  Filled 2012-01-20: qty 10

## 2012-01-20 MED ORDER — PACLITAXEL CHEMO INJECTION 300 MG/50ML
45.0000 mg/m2 | Freq: Once | INTRAVENOUS | Status: AC
  Administered 2012-01-20: 84 mg via INTRAVENOUS
  Filled 2012-01-20: qty 14

## 2012-01-20 MED ORDER — DIPHENHYDRAMINE HCL 50 MG/ML IJ SOLN
50.0000 mg | Freq: Once | INTRAMUSCULAR | Status: AC
  Administered 2012-01-20: 50 mg via INTRAVENOUS

## 2012-01-20 MED ORDER — ONDANSETRON 16 MG/50ML IVPB (CHCC)
16.0000 mg | Freq: Once | INTRAVENOUS | Status: AC
  Administered 2012-01-20: 16 mg via INTRAVENOUS

## 2012-01-20 MED ORDER — DEXAMETHASONE SODIUM PHOSPHATE 4 MG/ML IJ SOLN
20.0000 mg | Freq: Once | INTRAMUSCULAR | Status: AC
  Administered 2012-01-20: 20 mg via INTRAVENOUS

## 2012-01-20 MED ORDER — NYSTATIN 100000 UNIT/ML MT SUSP: 500000.0000 [IU] | Freq: Four times a day (QID) | OROMUCOSAL | Status: DC

## 2012-01-20 MED ORDER — HEPARIN SOD (PORK) LOCK FLUSH 100 UNIT/ML IV SOLN
500.0000 [IU] | Freq: Once | INTRAVENOUS | Status: AC | PRN
  Administered 2012-01-20: 500 [IU]
  Filled 2012-01-20: qty 5

## 2012-01-20 MED ORDER — FAMOTIDINE IN NACL 20-0.9 MG/50ML-% IV SOLN
20.0000 mg | Freq: Once | INTRAVENOUS | Status: AC
  Administered 2012-01-20: 20 mg via INTRAVENOUS

## 2012-01-20 NOTE — Patient Instructions (Addendum)
Change Coumadin to 2.5 mg on Monday and continue 5 mg all other days.

## 2012-01-20 NOTE — Progress Notes (Signed)
Patient complains of esophagitis. It is becoming quite difficult to eat. He has been trying to use his feeding tube more. His weight has declined to 164.9 pounds October 21 from his usual body weight 182 pounds. This reflects a 9% weight loss from usual body weight. He has tolerated Ensure Plus twice a day through his tube.  Nutrition diagnosis: Food and nutrition related knowledge deficit has improved.  New nutrition diagnosis : Suboptimal enteral nutrition infusion related to recurrent tonsil cancer and associated treatments as evidenced by patient's report of utilizing 2 cans of Ensure daily.  Estimated nutrition needs:  2100 - 2300 calories, 111- 125 g protein, 2.3 L fluid.  Intervention: Patient was encouraged to continue oral intake as tolerated to include water. I have educated patient on increasing Ensure Plus to goal rate of 1-1/2 cans 4 times a day with 120 mL of free water before and after each bolus feeding. In addition patient should consume at least 240 mL of water by mouth or via tube. I've recommended patient purchase a whey protein powder to mix with water and place in feeding tube per my instructions. He should receive an additional 40 g of protein daily from whey protein powder. Tube feeding plus protein powder will provide approximately 2300 calories, 118 g protein, and 2280 mL free water. Patient able to teach back to feeding regimen. I have answered his questions.  Monitoring, evaluation, and goals: The patient does tolerate tube feedings but has had decreased oral intake, therefore, will need to increase tube feedings to meet 100% of estimated needs.  Next visit: Monday, October 28, during chemotherapy.

## 2012-01-20 NOTE — Progress Notes (Signed)
[facility]  Telephone:([phone] [phone]) [phone]   OFFICE PROGRESS NOTE   Cc:  [name], MD  DIAGNOSIS: Recurrent T2 N2b M0 tonsillar cancer.   PAST THERAPY: He underwent in 2011 robotic resection with Dr. Hezzie Bump at [facility] in addition to left neck dissection. He was recommended to receive adjuvant radiation therapy. However, he declined at that time. He developed recurrent disease in May 2013. He received 2 cycles of induction chemo Taxotere, Cisplatin, and 5-FU in June and July 2013. He had very good partial response but grade 3 toxicity with anorexia, weight loss.   CURRENT THERAPY: Concurrent chemoradiation therapy with daily radiation and weekly Carboplatin and Taxol, started on 12/16/11.  INTERVAL HISTORY: [facility] 61 y.o. male returns for regular follow up with his wife.  He reports doing relatively well.  He has been having slight mucositis and xerostomia/thick phlegm.  Using Lortab elixir 1-2 times per day. He has been doing salt/baking soda mouth rinses. He has started using diluted peroxide. He stopped taking food by mouth yesterday and now is dependent upon his PEG for nutrition. Using 5-6 cans of Ensure per day.  PEG tube is clean without pain, erythema, or discharge. He still has diplopia.  He no longer has dizziness or syncope.  He is very active around the house.   Patient denies fever, fatigue, headache, visual changes, confusion, drenching night sweats, palpable lymph node swelling, odynophagia, dysphagia, nausea vomiting, jaundice, chest pain, palpitation, shortness of breath, dyspnea on exertion, productive cough, gum bleeding, epistaxis, hematemesis, hemoptysis, abdominal pain, abdominal swelling, early satiety, melena, hematochezia, hematuria, skin rash, spontaneous bleeding, joint swelling, joint pain, heat or cold intolerance, bowel bladder incontinence, back pain, focal motor weakness, paresthesia, depression, suicidal or homicidal  ideation, feeling hopelessness.   Past Medical History  Diagnosis Date  . Hypertension   . 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 GERD (gastroesophageal reflux disease) 10/15/2011  . Protein calorie malnutrition 10/15/2011  . Status post chemotherapy 10/04/11-10/13/11    taxotere/cisplatin/5FU  . Feeding by G-tube   . S/P radiation therapy 10/13/11 - 10/04/11  . Hx of sinus bradycardia   . DVT, bilateral lower limbs 10/29/11    Hx of  . Depression   . Tonsil cancer 12/04/2011    Past Surgical History  Procedure Date  . Splenectomy   . Tonsillectomy     one side    Current Outpatient Prescriptions  Medication Sig Dispense Refill  . Alum & Mag Hydroxide-Simeth (MAGIC MOUTHWASH W/LIDOCAINE) SOLN Take 5 mLs by mouth 4 (four) times daily. Swish and spit before meals and at bedtime.  500 mL  2  . dexamethasone (DECADRON) 4 MG tablet Take 1 mg by mouth every other day.       . diphenhydrAMINE (BENADRYL) 25 MG tablet Take 25-50 mg by mouth daily as needed. For allergy      . emollient (BIAFINE) cream Apply 1 Tube topically 2 (two) times daily.      . Ensure Plus (ENSURE PLUS) LIQD Begin Ensure Plus 1/2 can via PEG QID with 120 ml free water before and after each bolus feeding.  If tolerated increase to 1 can of Ensure Plus QID on day 3 and 4.  If tolerated, increase to goal of 1.5 cans QID with 120 ml free water before and after each bolus.  In addition, pt is to drink or put an additional 240 ml free water in PEG daily.  Please send supplies  and RN to home no later than Saturday, July 20th.  1422 mL  0  . escitalopram (LEXAPRO) 5 MG tablet Take 1 tablet (5 mg total) by mouth every morning.  90 tablet  2  . ferrous sulfate 325 (65 FE) MG tablet Take 325 mg by mouth 3 (three) times daily with meals.       . fludrocortisone (FLORINEF) 0.1 MG tablet Take 1 tablet (0.1 mg total) by mouth daily.  90 tablet  2  . hydrocodone-acetaminophen (HYCET) 7.5-325 MG/15ML solution Take 15 mLs  by mouth every 6 (six) hours as needed for pain.  500 mL  0  . midodrine (PROAMATINE) 10 MG tablet Take 5 mg by mouth 3 (three) times daily.      Marland Kitchen nystatin (MYCOSTATIN) 100000 UNIT/ML suspension Take 5 mLs (500,000 Units total) by mouth 4 (four) times daily.  240 mL  0  . ondansetron (ZOFRAN) 8 MG tablet Take 1 tablet (8 mg total) by mouth 2 (two) times daily as needed (Nausea or vomiting).  30 tablet  1  . prochlorperazine (COMPAZINE) 10 MG tablet Take 1 tablet (10 mg total) by mouth every 6 (six) hours as needed (Nausea or vomiting).  30 tablet  1  . warfarin (COUMADIN) 5 MG tablet Take 1 tablet (5 mg total) by mouth daily.  60 tablet  1   No current facility-administered medications for this visit.   Facility-Administered Medications Ordered in Other Visits  Medication Dose Route Frequency Provider Last Rate Last Dose  . 0.9 %  sodium chloride infusion   Intravenous Once Exie Parody, MD      . CARBOplatin (PARAPLATIN) 260 mg in sodium chloride 0.9 % 100 mL chemo infusion  260 mg Intravenous Once Exie Parody, MD   260 mg at 01/20/12 1253  . dexamethasone (DECADRON) injection 20 mg  20 mg Intravenous Once Exie Parody, MD   20 mg at 01/20/12 1054  . diphenhydrAMINE (BENADRYL) injection 50 mg  50 mg Intravenous Once Exie Parody, MD   50 mg at 01/20/12 1054  . famotidine (PEPCID) IVPB 20 mg  20 mg Intravenous Once Exie Parody, MD   20 mg at 01/20/12 1111  . heparin lock flush 100 unit/mL  500 Units Intracatheter Once PRN Exie Parody, MD   500 Units at 01/20/12 1338  . ondansetron (ZOFRAN) IVPB 16 mg  16 mg Intravenous Once Exie Parody, MD   16 mg at 01/20/12 1053  . PACLitaxel (TAXOL) 84 mg in dextrose 5 % 250 mL chemo infusion (</= 80mg /m2)  45 mg/m2 (Treatment Plan Actual) Intravenous Once Exie Parody, MD   84 mg at 01/20/12 1137  . DISCONTD: sodium chloride 0.9 % injection 10 mL  10 mL Intracatheter PRN Exie Parody, MD   10 mL at 01/20/12 1338    ALLERGIES:  is allergic to morphine and related.  REVIEW OF  SYSTEMS:  The rest of the 14-point review of system was negative.   Filed Vitals:   01/20/12 0845  BP: 135/89  Pulse: 73  Temp: 97.5 F (36.4 C)  Resp: 20   Wt Readings from Last 3 Encounters:  01/20/12 165 lb 3.2 oz (74.934 kg)  01/20/12 164 lb 14.4 oz (74.798 kg)  01/14/12 167 lb (75.751 kg)   ECOG Performance status: 1  PHYSICAL EXAMINATION:    General: well nourished man, in no acute distress. Eyes: no scleral icterus. ENT: There were no oropharyngeal lesions on my unaided  exam. Thrush noted on tongue. Neck was without thyromegaly. There was no longer fullness in the left cervical neck. Lymphatics: Negative cervical, supraclavicular or axillary adenopathy. Respiratory: lungs were clear bilaterally without wheezing or crackles. Cardiovascular: Regular rate and rhythm, S1/S2, without murmur, rub or gallop. There was no pedal edema. GI: abdomen was soft, flat, nontender, nondistended, without organomegaly.PEG tube was dry, clean, intact, nontender. Muscoloskeletal: no spinal tenderness of palpation of vertebral spine. Skin exam was without echymosis, petichae.  He was alert and oriented.  Attention was good. Language was appropriate. Mood was normal without depression. Speech was not pressured. Thought content was not tangential.     LABORATORY/RADIOLOGY DATA:  Lab Results  Component Value Date   WBC 3.5* 01/20/2012   HGB 13.3 01/20/2012   HCT 39.4 01/20/2012   PLT 216 01/20/2012   GLUCOSE 97 01/20/2012   CHOL 229* 08/29/2011   TRIG 143 08/29/2011   HDL 34* 08/29/2011   LDLCALC 166* 08/29/2011   ALKPHOS 71 01/20/2012   ALT 16 01/20/2012   AST 15 01/20/2012   NA 137 01/20/2012   K 4.0 01/20/2012   CL 100 01/20/2012   CREATININE 0.7 01/20/2012   BUN 13.0 01/20/2012   CO2 26 01/20/2012   INR 3.30 01/20/2012   HGBA1C 5.9* 08/28/2011     ASSESSMENT AND PLAN:    1. 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  - Impression: 2 cycles of neoadjuvant chemotherapy (without radiation) with very good response. He is s/p 4 cycles of weekly concurrent chemorad with Carboplatin/Taxol. He has grade 1-2 mucositis, grade 1 fatigue, grade 1 weight loss. He does not have dose limiting toxicity.  - Recommendation: Continue with concurrent weekly chemo Carboplatin/Taxol and daily radiation without dose modification.  2. Mucositis from chemorad:  - I advised him to continue salt/baking soda mouth rinse.   I advised him to start diluted H2O2 (1:4) for thick phlegm.  If this burns, we can try Lidocaine:Robitussin combo.  - Using Lortab 1-2 times per day. If he is using this 3-4 times a day consistently, will add Fentanyl patch. -3. Bilateral DVT. He is on Coumadin at 7.5mg  on Tue/Thu and 5mg  the rest of the week with therapeutic INR. His INR is 3.3.  I advised him to decrease Coumadin to 2.5 mg on Monday and 5mg  all other days of the week.  I will recheck his INR with next visit.  Goal is 6 months total of anticoagulation until the end of Nov 2013 .  4. Mild protein calorie malnutrition. Weight stable. Using PEG for nutrition. He is followed by Vernell Leep. 5 Mild deconditioning: He is able to ambulate and perform chores around the house.  6. 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 He is off Dexamethasone. 7. Bradycardia. Resolved.  8. Depression. He is on Lexapro 5 mg daily with stable mood.  9. Skin burn prophylaxis: I advised him to continue Biofene cream since he will have skin burn soon from treatment.  10. Oral candidiasis: Rx for Nystatin swish and swallow given today. 10.  Follow up: 1 week

## 2012-01-20 NOTE — Patient Instructions (Addendum)
[facility] Discharge Instructions for Patients Receiving Chemotherapy  Today you received the following chemotherapy agents Taxol and Carboplatin.  To help prevent nausea and vomiting after your treatment, we encourage you to take your nausea medication. Begin taking your nausea medication as often as prescribed for by Dr. Ha.    If you develop nausea and vomiting that is not controlled by your nausea medication, call the clinic. If it is after clinic hours your family physician or the after hours number for the clinic or go to the Emergency Department.   BELOW ARE SYMPTOMS THAT SHOULD BE REPORTED IMMEDIATELY:  *FEVER GREATER THAN 100.5 F  *CHILLS WITH OR WITHOUT FEVER  NAUSEA AND VOMITING THAT IS NOT CONTROLLED WITH YOUR NAUSEA MEDICATION  *UNUSUAL SHORTNESS OF BREATH  *UNUSUAL BRUISING OR BLEEDING  TENDERNESS IN MOUTH AND THROAT WITH OR WITHOUT PRESENCE OF ULCERS  *URINARY PROBLEMS  *BOWEL PROBLEMS  UNUSUAL RASH Items with * indicate a potential emergency and should be followed up as soon as possible.  One of the nurses will contact you 24 hours after your treatment. Please let the nurse know about any problems that you may have experienced. Feel free to call the clinic you have any questions or concerns. The clinic phone number is [phone].   I have been informed and understand all the instructions given to me. I know to contact the clinic, my physician, or go to the Emergency Department if any problems should occur. I do not have any questions at this time, but understand that I may call the clinic during office hours   should I have any questions or need assistance in obtaining follow up care.    __________________________________________  _____________  __________ Signature of Patient or Authorized Representative            Date                   Time    __________________________________________ Nurse's Signature    

## 2012-01-20 NOTE — Progress Notes (Signed)
Weekly Management Note:  Site: Nasopharynx/oropharynx/neck Current Dose:  5000  cGy Projected Dose: 7000  cGy   Narrative: The patient is seen today for routine under treatment assessment. CBCT/MVCT images/port films were reviewed. The chart was reviewed.   He is doing remarkably well. He started using his PEG tube yesterday. He does have pain on swallowing as expected. He uses hydrocodone elixir when necessary.  Physical Examination:  Filed Vitals:   01/20/12 1020  BP: 132/97  Pulse: 73  Temp: 98.3 F (36.8 C)  .  Weight: 165 lb 3.2 oz (74.934 kg). There is erythema and dry desquamation of the skin along both necks. I do not feel any discrete adenopathy. Oral cavity remarkable for thickened secretions with no evidence for candidiasis. His tongue deviates to the left.  Impression: Tolerating radiation therapy well.   Plan: Continue radiation therapy as planned.

## 2012-01-20 NOTE — Progress Notes (Addendum)
25/33 Fractions to Nasopharynx/Neck.  C/o sore throat.  Began using Peg tube on yesterday.  Instilled 2 cans of ensure plus on yesterday.  Bright Erythema noted and dryness on back of neck and skin intact.  [place] speech.  His cheeks are swollen.  Denies soreness of mouth.  For Chemotherapy today.

## 2012-01-20 NOTE — Telephone Encounter (Signed)
Printed and gv pt appt schedule for OCT °

## 2012-01-21 ENCOUNTER — Ambulatory Visit
Admission: RE | Admit: 2012-01-21 | Discharge: 2012-01-21 | Disposition: A | Discharge status: Home / Self Care | Payer: 59 | Source: Ambulatory Visit | Attending: Radiation Oncology | Admitting: Radiation Oncology

## 2012-01-22 ENCOUNTER — Ambulatory Visit: Payer: 59

## 2012-01-22 ENCOUNTER — Ambulatory Visit: Payer: Self-pay

## 2012-01-22 ENCOUNTER — Ambulatory Visit
Admission: RE | Admit: 2012-01-22 | Discharge: 2012-01-22 | Disposition: A | Discharge status: Home / Self Care | Payer: PRIVATE HEALTH INSURANCE | Source: Ambulatory Visit | Attending: Radiation Oncology | Admitting: Radiation Oncology

## 2012-01-22 DIAGNOSIS — K121 Other forms of stomatitis: Secondary | ICD-10-CM | POA: Insufficient documentation

## 2012-01-22 DIAGNOSIS — K209 Esophagitis, unspecified without bleeding: Secondary | ICD-10-CM | POA: Insufficient documentation

## 2012-01-22 DIAGNOSIS — L988 Other specified disorders of the skin and subcutaneous tissue: Secondary | ICD-10-CM | POA: Insufficient documentation

## 2012-01-22 DIAGNOSIS — Z7901 Long term (current) use of anticoagulants: Secondary | ICD-10-CM | POA: Insufficient documentation

## 2012-01-22 DIAGNOSIS — Z931 Gastrostomy status: Secondary | ICD-10-CM | POA: Insufficient documentation

## 2012-01-22 DIAGNOSIS — K1230 Oral mucositis (ulcerative), unspecified: Secondary | ICD-10-CM | POA: Insufficient documentation

## 2012-01-22 DIAGNOSIS — Z79899 Other long term (current) drug therapy: Secondary | ICD-10-CM | POA: Insufficient documentation

## 2012-01-22 DIAGNOSIS — Z510 Encounter for antineoplastic radiation therapy: Secondary | ICD-10-CM | POA: Insufficient documentation

## 2012-01-22 DIAGNOSIS — C099 Malignant neoplasm of tonsil, unspecified: Secondary | ICD-10-CM | POA: Insufficient documentation

## 2012-01-23 ENCOUNTER — Ambulatory Visit
Admission: RE | Admit: 2012-01-23 | Discharge: 2012-01-23 | Disposition: A | Discharge status: Home / Self Care | Payer: PRIVATE HEALTH INSURANCE | Source: Ambulatory Visit | Attending: Radiation Oncology | Admitting: Radiation Oncology

## 2012-01-23 ENCOUNTER — Ambulatory Visit: Payer: 59

## 2012-01-24 ENCOUNTER — Ambulatory Visit (HOSPITAL_BASED_OUTPATIENT_CLINIC_OR_DEPARTMENT_OTHER): Payer: PRIVATE HEALTH INSURANCE | Admitting: Oncology

## 2012-01-24 ENCOUNTER — Encounter: Payer: Self-pay | Admitting: Oncology

## 2012-01-24 ENCOUNTER — Other Ambulatory Visit (HOSPITAL_BASED_OUTPATIENT_CLINIC_OR_DEPARTMENT_OTHER): Payer: PRIVATE HEALTH INSURANCE | Admitting: Lab

## 2012-01-24 ENCOUNTER — Telehealth: Payer: Self-pay | Admitting: Oncology

## 2012-01-24 ENCOUNTER — Ambulatory Visit
Admission: RE | Admit: 2012-01-24 | Discharge: 2012-01-24 | Disposition: A | Discharge status: Home / Self Care | Payer: PRIVATE HEALTH INSURANCE | Source: Ambulatory Visit | Attending: Radiation Oncology | Admitting: Radiation Oncology

## 2012-01-24 ENCOUNTER — Ambulatory Visit: Payer: 59

## 2012-01-24 VITALS — BP 143/98 | HR 94 | Temp 98.4°F | Resp 20 | Ht 67.0 in | Wt 163.2 lb

## 2012-01-24 DIAGNOSIS — C099 Malignant neoplasm of tonsil, unspecified: Secondary | ICD-10-CM

## 2012-01-24 DIAGNOSIS — K121 Other forms of stomatitis: Secondary | ICD-10-CM

## 2012-01-24 DIAGNOSIS — C119 Malignant neoplasm of nasopharynx, unspecified: Secondary | ICD-10-CM

## 2012-01-24 DIAGNOSIS — E441 Mild protein-calorie malnutrition: Secondary | ICD-10-CM

## 2012-01-24 DIAGNOSIS — B370 Candidal stomatitis: Secondary | ICD-10-CM

## 2012-01-24 DIAGNOSIS — I82409 Acute embolism and thrombosis of unspecified deep veins of unspecified lower extremity: Secondary | ICD-10-CM

## 2012-01-24 LAB — PROTIME-INR
INR: 3.6 — ABNORMAL HIGH (ref 2.00–3.50)
Protime: 43.2 Seconds — ABNORMAL HIGH (ref 10.6–13.4)

## 2012-01-24 LAB — CBC WITH DIFFERENTIAL/PLATELET
BASO%: 0.7 % (ref 0.0–2.0)
Eosinophils Absolute: 0 10*3/uL (ref 0.0–0.5)
HCT: 39 % (ref 38.4–49.9)
HGB: 13.8 g/dL (ref 13.0–17.1)
MCHC: 35.4 g/dL (ref 32.0–36.0)
MONO#: 0.3 10*3/uL (ref 0.1–0.9)
NEUT#: 1.9 10*3/uL (ref 1.5–6.5)
NEUT%: 78 % — ABNORMAL HIGH (ref 39.0–75.0)
Platelets: 241 10*3/uL (ref 140–400)
WBC: 2.5 10*3/uL — ABNORMAL LOW (ref 4.0–10.3)
lymph#: 0.2 10*3/uL — ABNORMAL LOW (ref 0.9–3.3)

## 2012-01-24 MED ORDER — FLUCONAZOLE 100 MG PO TABS: 100.0000 mg | ORAL_TABLET | Freq: Every day | ORAL | Status: DC

## 2012-01-24 NOTE — Telephone Encounter (Signed)
s.w. pt and advised pt on 10.28.13 appts and advised him to come pick up schedule that day.

## 2012-01-24 NOTE — Patient Instructions (Addendum)
Coumadin: Hold Coumadin today. Restart tomorrow. Dose is to be 2.5 mg on Monday, Wednesday, and Friday and 5 mg on Tuesday, Thursday, Saturday, Sunday. I will recheck blood on Monday and Thursday next week then weekly thereafter.  Oral thrush: Stop the Nystatin due to burning in your mouth. Begin Diflucan (Fluconazole) daily for 10 days.  Follow-up with [place] in about 3 weeks.

## 2012-01-24 NOTE — Progress Notes (Signed)
[facility]  Telephone:(336) [phone] Fax:(336) [phone]   OFFICE PROGRESS NOTE   Cc:  Shane Heap, MD  DIAGNOSIS: Recurrent T2 N2b M0 tonsillar cancer.   PAST THERAPY: He underwent in 2011 robotic resection with [facility] in addition to left neck dissection. He was recommended to receive adjuvant radiation therapy. However, he declined at that time. He developed recurrent disease in May 2013. He received 2 cycles of induction chemo Taxotere, Cisplatin, and 5-FU in June and July 2013. He had very good partial response but grade 3 toxicity with anorexia, weight loss.   CURRENT THERAPY: Concurrent chemoradiation therapy with daily radiation and weekly Carboplatin and Taxol, started on 12/16/11.  INTERVAL HISTORY: Shane Marsh 61 y.o. male returns for regular follow up with his wife.  He reports doing relatively well.  He has been having slight mucositis and xerostomia/thick phlegm.  Using Lortab elixir 1-2 times per day. He has been doing salt/baking soda mouth rinses. He has started using diluted peroxide. He stopped taking food by mouthnow is dependent upon his PEG for nutrition. Using 4 cans of Ensure per day; weight stable  PEG tube is clean without pain, erythema, or discharge. He still has diplopia.  He no longer has dizziness or syncope. Tried Nystatin for oral candidiasis, but this caused severe burning in his mouth; he stopped taking this after one dose. He is very active around the house.   Patient denies fever, fatigue, headache, visual changes, confusion, drenching night sweats, palpable lymph node swelling, odynophagia, dysphagia, nausea vomiting, jaundice, chest pain, palpitation, shortness of breath, dyspnea on exertion, productive cough, gum bleeding, epistaxis, hematemesis, hemoptysis, abdominal pain, abdominal swelling, early satiety, melena, hematochezia, hematuria, skin rash, spontaneous bleeding, joint swelling, joint pain, heat or cold intolerance,  bowel bladder incontinence, back pain, focal motor weakness, paresthesia, depression, suicidal or homicidal ideation, feeling hopelessness.   Past Medical History  Diagnosis Date  . Hypertension   . 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 GERD (gastroesophageal reflux disease) 10/15/2011  . Protein calorie malnutrition 10/15/2011  . Status post chemotherapy 10/04/11-10/13/11    taxotere/cisplatin/5FU  . Feeding by G-tube   . S/P radiation therapy 10/13/11 - 10/04/11  . Hx of sinus bradycardia   . DVT, bilateral lower limbs 10/29/11    Hx of  . Depression   . Tonsil cancer 12/04/2011    Past Surgical History  Procedure Date  . Splenectomy   . Tonsillectomy     one side    Current Outpatient Prescriptions  Medication Sig Dispense Refill  . Alum & Mag Hydroxide-Simeth (MAGIC MOUTHWASH W/LIDOCAINE) SOLN Take 5 mLs by mouth 4 (four) times daily. Swish and spit before meals and at bedtime.  500 mL  2  . dexamethasone (DECADRON) 4 MG tablet Take 1 mg by mouth every other day.       . diphenhydrAMINE (BENADRYL) 25 MG tablet Take 25-50 mg by mouth daily as needed. For allergy      . emollient (BIAFINE) cream Apply 1 Tube topically 2 (two) times daily.      . Ensure Plus (ENSURE PLUS) LIQD Begin Ensure Plus 1/2 can via PEG QID with 120 ml free water before and after each bolus feeding.  If tolerated increase to 1 can of Ensure Plus QID on day 3 and 4.  If tolerated, increase to goal of 1.5 cans QID with 120 ml free water before and after each bolus.  In addition, pt  is to drink or put an additional 240 ml free water in PEG daily.  Please send supplies and RN to home no later than Saturday, July 20th.  1422 mL  0  . escitalopram (LEXAPRO) 5 MG tablet Take 1 tablet (5 mg total) by mouth every morning.  90 tablet  2  . ferrous sulfate 325 (65 FE) MG tablet Take 325 mg by mouth 3 (three) times daily with meals.       . fluconazole (DIFLUCAN) 100 MG tablet Take 1 tablet (100 mg total) by  mouth daily.  10 tablet  0  . fludrocortisone (FLORINEF) 0.1 MG tablet Take 1 tablet (0.1 mg total) by mouth daily.  90 tablet  2  . hydrocodone-acetaminophen (HYCET) 7.5-325 MG/15ML solution Take 15 mLs by mouth every 6 (six) hours as needed for pain.  500 mL  0  . midodrine (PROAMATINE) 10 MG tablet Take 5 mg by mouth 3 (three) times daily.      Marland Kitchen nystatin (MYCOSTATIN) 100000 UNIT/ML suspension Take 5 mLs (500,000 Units total) by mouth 4 (four) times daily.  240 mL  0  . ondansetron (ZOFRAN) 8 MG tablet Take 1 tablet (8 mg total) by mouth 2 (two) times daily as needed (Nausea or vomiting).  30 tablet  1  . prochlorperazine (COMPAZINE) 10 MG tablet Take 1 tablet (10 mg total) by mouth every 6 (six) hours as needed (Nausea or vomiting).  30 tablet  1  . warfarin (COUMADIN) 5 MG tablet Take 1 tablet (5 mg total) by mouth daily.  60 tablet  1    ALLERGIES:  is allergic to morphine and related.  REVIEW OF SYSTEMS:  The rest of the 14-point review of system was negative.   Filed Vitals:   01/24/12 0947  BP: 143/98  Pulse: 94  Temp: 98.4 F (36.9 C)  Resp: 20   Wt Readings from Last 3 Encounters:  01/24/12 163 lb 3.2 oz (74.027 kg)  01/20/12 165 lb 3.2 oz (74.934 kg)  01/20/12 164 lb 14.4 oz (74.798 kg)   ECOG Performance status: 1  PHYSICAL EXAMINATION:    General: well nourished man, in no acute distress. Eyes: no scleral icterus. ENT: There were no oropharyngeal lesions on my unaided exam. Thrush noted on tongue. Neck was without thyromegaly. There was no longer fullness in the left cervical neck. Lymphatics: Negative cervical, supraclavicular or axillary adenopathy. Respiratory: lungs were clear bilaterally without wheezing or crackles. Cardiovascular: Regular rate and rhythm, S1/S2, without murmur, rub or gallop. There was no pedal edema. GI: abdomen was soft, flat, nontender, nondistended, without organomegaly.PEG tube was dry, clean, intact, nontender. Muscoloskeletal: no spinal  tenderness of palpation of vertebral spine. Skin exam was without echymosis, petichae.  He was alert and oriented.  Attention was good. Language was appropriate. Mood was normal without depression. Speech was not pressured. Thought content was not tangential.     LABORATORY/RADIOLOGY DATA:  Lab Results  Component Value Date   WBC 2.5* 01/24/2012   HGB 13.8 01/24/2012   HCT 39.0 01/24/2012   PLT 241 01/24/2012   GLUCOSE 97 01/20/2012   CHOL 229* 08/29/2011   TRIG 143 08/29/2011   HDL 34* 08/29/2011   LDLCALC 166* 08/29/2011   ALKPHOS 71 01/20/2012   ALT 16 01/20/2012   AST 15 01/20/2012   NA 137 01/20/2012   K 4.0 01/20/2012   CL 100 01/20/2012   CREATININE 0.7 01/24/2012   BUN 13.0 01/24/2012   CO2 26 01/20/2012   INR  3.60* 01/24/2012   HGBA1C 5.9* 08/28/2011     ASSESSMENT AND PLAN:    1. 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  - Impression: 2 cycles of neoadjuvant chemotherapy (without radiation) with very good response. He is s/p 4 cycles of weekly concurrent chemorad with Carboplatin/Taxol. He has grade 1-2 mucositis, grade 1 fatigue, grade 1 weight loss. He does not have dose limiting toxicity.  - Recommendation: Continue with concurrent weekly chemo Carboplatin/Taxol and daily radiation without dose modification.   2. Mucositis from chemorad:  - I advised him to continue salt/baking soda and diluted peroxide mouth rinses.   I advised him to start diluted H2O2 (1:4) for thick phlegm.  If this burns, we can try Lidocaine:Robitussin combo.  - Using Lortab 1-2 times per day. If he is using this 3-4 times a day consistently, will add Fentanyl patch.  3. Bilateral DVT. He is on Coumadin 2.5 mg on Monday and 5 mg all other days of the week. INR is elevated at 3.6. I will need to begin Diflucan due to  thrush (see #10 below). I advised him to hold Coumadin today then dose is to be 2.5 mg on Monday, Wednesday, and Friday and 5 mg on Tuesday, Thursday, SAturday, and Sunday. INR is scheduled for Monday and Thursday next week then weekly thereafter.  Goal is 6 months total of anticoagulation until the end of Nov 2013.  4. Mild protein calorie malnutrition. Weight stable. Using PEG for nutrition. He is followed by Vernell Leep.  5 Mild deconditioning: He is able to ambulate and perform chores around the house.   6. 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 He is off Dexamethasone.  7. Bradycardia. Resolved.   8. Depression. He is on Lexapro 5 mg daily with stable mood.   9. Skin burn prophylaxis: I advised him to continue Biafene cream since he will have skin burn soon from treatment.   10. Oral candidiasis: Could not tolerate Nystatin due to burning. I will give him a course of Fluconazole. Close monitoring of INR due to interaction with Coumadin.  11.  Follow up: 3 weeks.

## 2012-01-27 ENCOUNTER — Encounter: Payer: Self-pay | Admitting: Radiation Oncology

## 2012-01-27 ENCOUNTER — Telehealth: Payer: Self-pay | Admitting: Oncology

## 2012-01-27 ENCOUNTER — Ambulatory Visit: Payer: 59

## 2012-01-27 ENCOUNTER — Other Ambulatory Visit (HOSPITAL_BASED_OUTPATIENT_CLINIC_OR_DEPARTMENT_OTHER): Payer: PRIVATE HEALTH INSURANCE | Admitting: Lab

## 2012-01-27 ENCOUNTER — Ambulatory Visit: Payer: PRIVATE HEALTH INSURANCE | Admitting: Nutrition

## 2012-01-27 ENCOUNTER — Other Ambulatory Visit: Payer: Self-pay

## 2012-01-27 ENCOUNTER — Ambulatory Visit
Admission: RE | Admit: 2012-01-27 | Discharge: 2012-01-27 | Disposition: A | Discharge status: Home / Self Care | Payer: PRIVATE HEALTH INSURANCE | Source: Ambulatory Visit | Attending: Radiation Oncology | Admitting: Radiation Oncology

## 2012-01-27 ENCOUNTER — Ambulatory Visit
Admission: RE | Admit: 2012-01-27 | Discharge: 2012-01-27 | Disposition: A | Discharge status: Home / Self Care | Payer: 59 | Source: Ambulatory Visit | Attending: Radiation Oncology | Admitting: Radiation Oncology

## 2012-01-27 ENCOUNTER — Ambulatory Visit (HOSPITAL_BASED_OUTPATIENT_CLINIC_OR_DEPARTMENT_OTHER): Payer: PRIVATE HEALTH INSURANCE

## 2012-01-27 VITALS — BP 151/109 | HR 89 | Temp 98.9°F | Resp 20 | Wt 161.3 lb

## 2012-01-27 VITALS — BP 133/94 | HR 88 | Temp 98.0°F | Resp 17

## 2012-01-27 DIAGNOSIS — C119 Malignant neoplasm of nasopharynx, unspecified: Secondary | ICD-10-CM

## 2012-01-27 DIAGNOSIS — Z7901 Long term (current) use of anticoagulants: Secondary | ICD-10-CM

## 2012-01-27 DIAGNOSIS — Z5111 Encounter for antineoplastic chemotherapy: Secondary | ICD-10-CM

## 2012-01-27 DIAGNOSIS — I82409 Acute embolism and thrombosis of unspecified deep veins of unspecified lower extremity: Secondary | ICD-10-CM

## 2012-01-27 DIAGNOSIS — C099 Malignant neoplasm of tonsil, unspecified: Secondary | ICD-10-CM

## 2012-01-27 LAB — PROTIME-INR: Protime: 56.4 Seconds — ABNORMAL HIGH (ref 10.6–13.4)

## 2012-01-27 MED ORDER — ONDANSETRON 16 MG/50ML IVPB (CHCC)
16.0000 mg | Freq: Once | INTRAVENOUS | Status: AC
  Administered 2012-01-27: 16 mg via INTRAVENOUS

## 2012-01-27 MED ORDER — FAMOTIDINE IN NACL 20-0.9 MG/50ML-% IV SOLN
20.0000 mg | Freq: Once | INTRAVENOUS | Status: AC
  Administered 2012-01-27: 20 mg via INTRAVENOUS

## 2012-01-27 MED ORDER — SODIUM CHLORIDE 0.9 % IV SOLN
259.4000 mg | Freq: Once | INTRAVENOUS | Status: AC
  Administered 2012-01-27: 260 mg via INTRAVENOUS
  Filled 2012-01-27: qty 26

## 2012-01-27 MED ORDER — DEXAMETHASONE SODIUM PHOSPHATE 4 MG/ML IJ SOLN
20.0000 mg | Freq: Once | INTRAMUSCULAR | Status: AC
  Administered 2012-01-27: 20 mg via INTRAVENOUS

## 2012-01-27 MED ORDER — DIPHENHYDRAMINE HCL 50 MG/ML IJ SOLN
50.0000 mg | Freq: Once | INTRAMUSCULAR | Status: AC
  Administered 2012-01-27: 50 mg via INTRAVENOUS

## 2012-01-27 MED ORDER — SODIUM CHLORIDE 0.9 % IJ SOLN
10.0000 mL | INTRAMUSCULAR | Status: DC | PRN
  Administered 2012-01-27: 10 mL
  Filled 2012-01-27: qty 10

## 2012-01-27 MED ORDER — FENTANYL 12 MCG/HR TD PT72: 1.0000 | MEDICATED_PATCH | TRANSDERMAL | Status: DC

## 2012-01-27 MED ORDER — SODIUM CHLORIDE 0.9 % IV SOLN
Freq: Once | INTRAVENOUS | Status: AC
  Administered 2012-01-27: 10:00:00 via INTRAVENOUS

## 2012-01-27 MED ORDER — HEPARIN SOD (PORK) LOCK FLUSH 100 UNIT/ML IV SOLN
500.0000 [IU] | Freq: Once | INTRAVENOUS | Status: AC | PRN
  Administered 2012-01-27: 500 [IU]
  Filled 2012-01-27: qty 5

## 2012-01-27 MED ORDER — PACLITAXEL CHEMO INJECTION 300 MG/50ML
45.0000 mg/m2 | Freq: Once | INTRAVENOUS | Status: AC
  Administered 2012-01-27: 84 mg via INTRAVENOUS
  Filled 2012-01-27: qty 14

## 2012-01-27 NOTE — Progress Notes (Signed)
   Weekly Management Note Current Dose:  60 Gy  Projected Dose:  66 Gy   Narrative:  The patient presents for routine under treatment assessment.  CBCT/MVCT images/Port film x-rays were reviewed.  The chart was checked. He is doing relatively well. Tolerating PEG tube feedings. Using Lortab elixir only. Has been prescribed fluconazole for thrush. He is going up stairs to be evaluated for chemotherapy this morning. 3 more treatments of radiation to go. Pain is 6/10 in intensity. Using Biafine over his skin. Using baking soda gargles for his mouth and thickened secretions.  Physical Findings:  weight is 161 lb 4.8 oz (73.165 kg). His oral temperature is 98.9 F (37.2 C). His blood pressure is 151/109 and his pulse is 89. His respiration is 20.  sitting in a chair comfortably. There is patchy moist desquamation over his low neck. Diffuse erythema over his neck. Oropharynx is notable for confluent mucositis and possible thrush versus mucositis. Thickened secretions.  CBC    Component Value Date/Time   WBC 2.5* 01/24/2012 0847   WBC 38.7* 10/18/2011 1017   RBC 3.99* 01/24/2012 0847   RBC 3.78* 10/18/2011 1017   HGB 13.8 01/24/2012 0847   HGB 11.7* 10/18/2011 1017   HCT 39.0 01/24/2012 0847   HCT 33.6* 10/18/2011 1017   PLT 241 01/24/2012 0847   PLT 240 10/18/2011 1017   MCV 97.7 01/24/2012 0847   MCV 88.9 10/18/2011 1017   MCH 34.6* 01/24/2012 0847   MCH 31.0 10/18/2011 1017   MCHC 35.4 01/24/2012 0847   MCHC 34.8 10/18/2011 1017   RDW 15.0* 01/24/2012 0847   RDW 15.8* 10/18/2011 1017   LYMPHSABS 0.2* 01/24/2012 0847   LYMPHSABS 1.9 09/11/2011 0030   MONOABS 0.3 01/24/2012 0847   MONOABS 1.3* 09/11/2011 0030   EOSABS 0.0 01/24/2012 0847   EOSABS 0.0 09/11/2011 0030   BASOSABS 0.0 01/24/2012 0847   BASOSABS 0.0 09/11/2011 0030     CMP     Component Value Date/Time   NA 137 01/20/2012 0806   NA 138 11/06/2011 1020   K 4.0 01/20/2012 0806   K 3.8 11/06/2011 1020   CL 100 01/20/2012 0806   CL  103 11/06/2011 1020   CO2 26 01/20/2012 0806   CO2 26 11/06/2011 1020   GLUCOSE 97 01/20/2012 0806   GLUCOSE 85 11/06/2011 1020   BUN 13.0 01/24/2012 0847   BUN 15 11/06/2011 1020   CREATININE 0.7 01/24/2012 0847   CREATININE 0.49* 11/06/2011 1020   CALCIUM 9.4 01/20/2012 0806   CALCIUM 8.7 11/06/2011 1020   PROT 6.3* 01/20/2012 0806   PROT 4.9* 11/06/2011 1020   ALBUMIN 3.5 01/20/2012 0806   ALBUMIN 3.0* 11/06/2011 1020   AST 15 01/20/2012 0806   AST 17 11/06/2011 1020   ALT 16 01/20/2012 0806   ALT 40 11/06/2011 1020   ALKPHOS 71 01/20/2012 0806   ALKPHOS 70 11/06/2011 1020   BILITOT 0.50 01/20/2012 0806   BILITOT 0.3 11/06/2011 1020   GFRNONAA >90 09/20/2011 0443   GFRAA >90 09/20/2011 0443    Impression:  The patient is tolerating radiotherapy.  Plan:  Continue radiotherapy as planned. Continue Fluconazole. Due to inadequate pain control I recommended starting a Duragesic patch. We will start a 76mcg/hour. I will reevaluate him in 2 days.  ________________________________   Lonie Peak, M.D.

## 2012-01-27 NOTE — Progress Notes (Signed)
Patient states he is unable to eat anything by mouth except water. He is tolerating one can of Ensure Plus 4 times a day with 120 mL free water before and after each bolus feeding. His weight has declined to 161.3 pounds today, October 28, from 164.9 pounds on October 21. Patient denies other nutritional issues. He has not yet started protein powder.   Nutrition diagnosis: Food and nutrition related knowledge deficit continues. Diagnosis of suboptimal enteral nutrition infusion continues.  Intervention: I educated patient to work to increase Ensure Plus via PEG to 6 cans daily with 120 mL of free water before and after each bolus feeding 4 times a day. Patient is to continue free water by mouth of at least 240 mL daily. Patient was instructed to add 20 g of whey protein twice a day via PEG in addition to increasing Ensure Plus to 6 cans daily. Patient and wife verbalized new tube feeding orders. All questions were answered. Patient was agreeable to changes.  Monitoring, evaluation, and goals: The patient is tolerating bolus tube feedings via PEG using Ensure Plus. He will work to increase total bolus feeds to 6 cans daily along with 2 scoops of protein powder for an additional 40 g of whey protein to meet 100% of estimated nutrition needs.  Next visit: Friday, November 15, after Dr. Lodema Pilot appointment.

## 2012-01-27 NOTE — Telephone Encounter (Signed)
Spoke with patient in the infusion area. I have asked him to hold Coumadin x2 days (today and tomorrow) then resume Coumadin on 01/29/12 at 2.5 mg daily. He has a lab appt on 01/30/12 and will dose Coumadin further at that point in time.

## 2012-01-27 NOTE — Progress Notes (Signed)
Pt due for chemotherapy this morning; seen briefly by this RN then seen by Dr Basilio Cairo. Pt c/o throat pain 6/10, taking pain med. Pt applying Biafine to skin of neck in tx area for hyperpigmentation. Pt tol peg tube feeding well.

## 2012-01-27 NOTE — Patient Instructions (Signed)
[facility] Discharge Instructions for Patients Receiving Chemotherapy  Today you received the following chemotherapy agents Taxol and Carboplatin.  To help prevent nausea and vomiting after your treatment, we encourage you to take your nausea medication. Begin taking your nausea medication as often as prescribed for by Dr. Gaylyn Rong.    If you develop nausea and vomiting that is not controlled by your nausea medication, call the clinic. If it is after clinic hours your family physician or the after hours number for the clinic or go to the Emergency Department.   BELOW ARE SYMPTOMS THAT SHOULD BE REPORTED IMMEDIATELY:  *FEVER GREATER THAN 100.5 F  *CHILLS WITH OR WITHOUT FEVER  NAUSEA AND VOMITING THAT IS NOT CONTROLLED WITH YOUR NAUSEA MEDICATION  *UNUSUAL SHORTNESS OF BREATH  *UNUSUAL BRUISING OR BLEEDING  TENDERNESS IN MOUTH AND THROAT WITH OR WITHOUT PRESENCE OF ULCERS  *URINARY PROBLEMS  *BOWEL PROBLEMS  UNUSUAL RASH Items with * indicate a potential emergency and should be followed up as soon as possible.  One of the nurses will contact you 24 hours after your treatment. Please let the nurse know about any problems that you may have experienced. Feel free to call the clinic you have any questions or concerns. The clinic phone number is [phone].   I have been informed and understand all the instructions given to me. I know to contact the clinic, my physician, or go to the Emergency Department if any problems should occur. I do not have any questions at this time, but understand that I may call the clinic during office hours   should I have any questions or need assistance in obtaining follow up care.    __________________________________________  _____________  __________ Signature of Patient or Authorized Representative            Date                   Time    __________________________________________ Nurse's Signature

## 2012-01-28 ENCOUNTER — Ambulatory Visit
Admission: RE | Admit: 2012-01-28 | Discharge: 2012-01-28 | Disposition: A | Discharge status: Home / Self Care | Payer: 59 | Source: Ambulatory Visit | Attending: Radiation Oncology | Admitting: Radiation Oncology

## 2012-01-28 ENCOUNTER — Ambulatory Visit: Payer: PRIVATE HEALTH INSURANCE

## 2012-01-28 DIAGNOSIS — C099 Malignant neoplasm of tonsil, unspecified: Secondary | ICD-10-CM | POA: Insufficient documentation

## 2012-01-28 DIAGNOSIS — Z510 Encounter for antineoplastic radiation therapy: Secondary | ICD-10-CM | POA: Insufficient documentation

## 2012-01-28 DIAGNOSIS — K121 Other forms of stomatitis: Secondary | ICD-10-CM | POA: Insufficient documentation

## 2012-01-28 DIAGNOSIS — Z931 Gastrostomy status: Secondary | ICD-10-CM | POA: Insufficient documentation

## 2012-01-28 DIAGNOSIS — Z7901 Long term (current) use of anticoagulants: Secondary | ICD-10-CM | POA: Insufficient documentation

## 2012-01-28 DIAGNOSIS — L988 Other specified disorders of the skin and subcutaneous tissue: Secondary | ICD-10-CM | POA: Insufficient documentation

## 2012-01-28 DIAGNOSIS — K209 Esophagitis, unspecified without bleeding: Secondary | ICD-10-CM | POA: Insufficient documentation

## 2012-01-28 DIAGNOSIS — Z79899 Other long term (current) drug therapy: Secondary | ICD-10-CM | POA: Insufficient documentation

## 2012-01-29 ENCOUNTER — Ambulatory Visit
Admission: RE | Admit: 2012-01-29 | Discharge: 2012-01-29 | Disposition: A | Discharge status: Home / Self Care | Payer: PRIVATE HEALTH INSURANCE | Source: Ambulatory Visit | Attending: Radiation Oncology | Admitting: Radiation Oncology

## 2012-01-29 ENCOUNTER — Ambulatory Visit
Admission: RE | Admit: 2012-01-29 | Discharge: 2012-01-29 | Disposition: A | Discharge status: Home / Self Care | Payer: 59 | Source: Ambulatory Visit | Attending: Radiation Oncology | Admitting: Radiation Oncology

## 2012-01-29 ENCOUNTER — Ambulatory Visit: Payer: PRIVATE HEALTH INSURANCE

## 2012-01-29 VITALS — BP 174/117 | HR 83 | Temp 98.2°F | Resp 20 | Wt 160.5 lb

## 2012-01-29 DIAGNOSIS — C099 Malignant neoplasm of tonsil, unspecified: Secondary | ICD-10-CM

## 2012-01-29 MED ORDER — HYDROCODONE-ACETAMINOPHEN 7.5-325 MG/15ML PO SOLN: 15.0000 mL | Freq: Four times a day (QID) | ORAL | Status: DC | PRN

## 2012-01-29 NOTE — Progress Notes (Signed)
Patient returned to see Dr.Squire  F/u on new rx duragesic patch 12.63mcg given to him the other day, whispers, says pain is much better with the patch, neck is still has  dry desquamation, flaking, states ne needs refill on his lortab elixir almost out, he saw Barbarq neff yesterday,  And started protein powder to his bolus feeds via peg tube 9:55 AM

## 2012-01-29 NOTE — Progress Notes (Signed)
   Weekly Management Note Current Dose:  64 Gy  Projected Dose: 66 Gy   Narrative:  The patient presents for routine under treatment assessment.  CBCT/MVCT images/Port film x-rays were reviewed.  The chart was checked. Per new rx duragesic patch 12.55mcg given to him the other day, pain is much better with the patch, neck is still has dry desquamation, flaking, states ne needs refill on his lortab elixir almost out.  He saw Barbarq neff yesterday, And started protein powder to his bolus feeds via peg tube.    Physical Findings:  weight is 160 lb 8 oz (72.802 kg). His oral temperature is 98.2 F (36.8 C). His blood pressure is 174/117 and his pulse is 83. His respiration is 20.  neck is erythematous with dry desquamation  CBC    Component Value Date/Time   WBC 2.5* 01/24/2012 0847   WBC 38.7* 10/18/2011 1017   RBC 3.99* 01/24/2012 0847   RBC 3.78* 10/18/2011 1017   HGB 13.8 01/24/2012 0847   HGB 11.7* 10/18/2011 1017   HCT 39.0 01/24/2012 0847   HCT 33.6* 10/18/2011 1017   PLT 241 01/24/2012 0847   PLT 240 10/18/2011 1017   MCV 97.7 01/24/2012 0847   MCV 88.9 10/18/2011 1017   MCH 34.6* 01/24/2012 0847   MCH 31.0 10/18/2011 1017   MCHC 35.4 01/24/2012 0847   MCHC 34.8 10/18/2011 1017   RDW 15.0* 01/24/2012 0847   RDW 15.8* 10/18/2011 1017   LYMPHSABS 0.2* 01/24/2012 0847   LYMPHSABS 1.9 09/11/2011 0030   MONOABS 0.3 01/24/2012 0847   MONOABS 1.3* 09/11/2011 0030   EOSABS 0.0 01/24/2012 0847   EOSABS 0.0 09/11/2011 0030   BASOSABS 0.0 01/24/2012 0847   BASOSABS 0.0 09/11/2011 0030    CMP     Component Value Date/Time   NA 137 01/20/2012 0806   NA 138 11/06/2011 1020   K 4.0 01/20/2012 0806   K 3.8 11/06/2011 1020   CL 100 01/20/2012 0806   CL 103 11/06/2011 1020   CO2 26 01/20/2012 0806   CO2 26 11/06/2011 1020   GLUCOSE 97 01/20/2012 0806   GLUCOSE 85 11/06/2011 1020   BUN 13.0 01/24/2012 0847   BUN 15 11/06/2011 1020   CREATININE 0.7 01/24/2012 0847   CREATININE 0.49* 11/06/2011 1020     CALCIUM 9.4 01/20/2012 0806   CALCIUM 8.7 11/06/2011 1020   PROT 6.3* 01/20/2012 0806   PROT 4.9* 11/06/2011 1020   ALBUMIN 3.5 01/20/2012 0806   ALBUMIN 3.0* 11/06/2011 1020   AST 15 01/20/2012 0806   AST 17 11/06/2011 1020   ALT 16 01/20/2012 0806   ALT 40 11/06/2011 1020   ALKPHOS 71 01/20/2012 0806   ALKPHOS 70 11/06/2011 1020   BILITOT 0.50 01/20/2012 0806   BILITOT 0.3 11/06/2011 1020   GFRNONAA >90 09/20/2011 0443   GFRAA >90 09/20/2011 0443     Impression:  The patient is tolerating radiotherapy.  Plan:  Continue radiotherapy as planned. Followup in 2-3 weeks. Lortab elixir refill today.  ________________________________   Lonie Peak, M.D.

## 2012-01-30 ENCOUNTER — Ambulatory Visit
Admission: RE | Admit: 2012-01-30 | Discharge: 2012-01-30 | Disposition: A | Discharge status: Home / Self Care | Payer: PRIVATE HEALTH INSURANCE | Source: Ambulatory Visit | Attending: Radiation Oncology | Admitting: Radiation Oncology

## 2012-01-30 ENCOUNTER — Ambulatory Visit: Payer: PRIVATE HEALTH INSURANCE

## 2012-01-30 ENCOUNTER — Encounter: Payer: Self-pay | Admitting: Radiation Oncology

## 2012-01-30 ENCOUNTER — Other Ambulatory Visit: Payer: Self-pay | Admitting: Oncology

## 2012-01-30 ENCOUNTER — Telehealth: Payer: Self-pay | Admitting: Oncology

## 2012-01-30 ENCOUNTER — Other Ambulatory Visit (HOSPITAL_BASED_OUTPATIENT_CLINIC_OR_DEPARTMENT_OTHER): Payer: PRIVATE HEALTH INSURANCE | Admitting: Lab

## 2012-01-30 DIAGNOSIS — I82409 Acute embolism and thrombosis of unspecified deep veins of unspecified lower extremity: Secondary | ICD-10-CM

## 2012-01-30 DIAGNOSIS — C119 Malignant neoplasm of nasopharynx, unspecified: Secondary | ICD-10-CM

## 2012-01-30 LAB — PROTIME-INR

## 2012-01-30 LAB — PROTHROMBIN TIME: INR: 5.68 (ref <1.50)

## 2012-01-30 NOTE — Telephone Encounter (Signed)
lvm for pt regarding 11.4.13 appt.

## 2012-02-02 NOTE — Progress Notes (Signed)
  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 Hezzie Bump at [facility] in addition to left neck dissection. He was recommended to receive adjuvant radiation therapy. However, he declined at that time. He developed recurrent disease in May 2013. He received 2 fractions of urgent radiotherapy (total dose 4 Gy) and ended up in the ICU with bradycardia/ syncope, potentially from peritumoral swelling.  Radiotherapy was put on hold and he received 2 cycles of induction chemo Taxotere, Cisplatin, and 5-FU in June and July 2013. He had very near near complete response but grade 3 toxicity with anorexia, weight loss.  He then started Concurrent chemoradiation therapy with daily radiation and weekly Carboplatin and Taxol  on 12/16/11. He tolerated this very well. He used his PEG tube successfully. Mucositis pain alleviated with Fentanyl patches. Thrush treated with Fluconazole. Skin treated with Biafine.   Plan: The patient has completed radiation treatment. The patient will return to radiation oncology clinic for routine followup in 2-3 weeks. I advised them to call or return sooner if they have any questions or concerns related to their recovery or treatment.  -----------------------------------  Lonie Peak, MD

## 2012-02-03 ENCOUNTER — Other Ambulatory Visit: Payer: Self-pay | Admitting: Radiation Oncology

## 2012-02-03 ENCOUNTER — Telehealth: Payer: Self-pay | Admitting: *Deleted

## 2012-02-03 ENCOUNTER — Other Ambulatory Visit (HOSPITAL_BASED_OUTPATIENT_CLINIC_OR_DEPARTMENT_OTHER): Payer: PRIVATE HEALTH INSURANCE | Admitting: Lab

## 2012-02-03 ENCOUNTER — Encounter: Payer: Self-pay | Admitting: Radiation Oncology

## 2012-02-03 ENCOUNTER — Ambulatory Visit
Admission: RE | Admit: 2012-02-03 | Discharge: 2012-02-03 | Disposition: A | Discharge status: Home / Self Care | Payer: PRIVATE HEALTH INSURANCE | Source: Ambulatory Visit | Attending: Radiation Oncology | Admitting: Radiation Oncology

## 2012-02-03 VITALS — BP 149/103 | HR 89 | Temp 98.4°F

## 2012-02-03 DIAGNOSIS — L988 Other specified disorders of the skin and subcutaneous tissue: Secondary | ICD-10-CM | POA: Insufficient documentation

## 2012-02-03 DIAGNOSIS — C119 Malignant neoplasm of nasopharynx, unspecified: Secondary | ICD-10-CM

## 2012-02-03 DIAGNOSIS — Z7901 Long term (current) use of anticoagulants: Secondary | ICD-10-CM | POA: Insufficient documentation

## 2012-02-03 DIAGNOSIS — I82409 Acute embolism and thrombosis of unspecified deep veins of unspecified lower extremity: Secondary | ICD-10-CM

## 2012-02-03 DIAGNOSIS — K1370 Unspecified lesions of oral mucosa: Secondary | ICD-10-CM | POA: Insufficient documentation

## 2012-02-03 DIAGNOSIS — C099 Malignant neoplasm of tonsil, unspecified: Secondary | ICD-10-CM | POA: Insufficient documentation

## 2012-02-03 LAB — PROTIME-INR: Protime: 33.6 Seconds — ABNORMAL HIGH (ref 10.6–13.4)

## 2012-02-03 MED ORDER — SILVER SULFADIAZINE 1 % EX CREA
TOPICAL_CREAM | Freq: Two times a day (BID) | CUTANEOUS | Status: DC
  Administered 2012-02-03: 18:00:00 via TOPICAL

## 2012-02-03 MED ORDER — FLUCONAZOLE 100 MG PO TABS: 100.0000 mg | ORAL_TABLET | Freq: Every day | ORAL | Status: DC

## 2012-02-03 MED ORDER — BIAFINE EX EMUL
CUTANEOUS | Status: DC | PRN
  Administered 2012-02-03: 1 via TOPICAL

## 2012-02-03 NOTE — Telephone Encounter (Signed)
Message copied by Wende Mott on Mon Feb 03, 2012  2:28 PM ------      Message from: Hillman R      Created: Mon Feb 03, 2012 10:24 AM       Please call pt. Tell him to restart Coumadin at 2.5 mg daily. Recheck INR as scheduled this Thursday.

## 2012-02-03 NOTE — Telephone Encounter (Signed)
Called pt's home and spoke w/ wife.  She states pt taking a nap.  I gave her instructions for pt to restart Coumadin 2.5 mg (1/2 of a 5 mg pill) this evening and keep next lab appt on Thursday as scheduled at 9;30 am.  She verbalized understanding and states will relay instructions to pt.

## 2012-02-03 NOTE — Progress Notes (Signed)
I saw the patient today at his request. He came in due to worsening skin over his neck. He appears to have patches of moist desquamation. He denies any fevers. Oropharyngeal exam demonstrates persistent thrush versus confluent mucositis over the tongue and palate.  Plan: will prescribe another 10 days of fluconazole. I notified medical oncology as this may affect his dosing of Coumadin and INR lab orders. In terms of his skin we will start Silvadene. I will see the patient back in a couple weeks.   -----------------------------------  Lonie Peak, MD

## 2012-02-03 NOTE — Progress Notes (Signed)
Shane Marsh  Arrived today with evidence of moist desquamation in his treatment in on the left upper chest and left neck with a small area on the left  Posterior neck region.  His spouse states that these area have drained periodically.  Presently the areas are without drainage but have a yellow appearance.  Patient examined by Dr. Basilio Cairo and he will now apply Silvadene to these areas BID.  Instructed to cleanse skin with Sterile NS , pat dry, then apply silvadene using steriel 4 x 4 gauze for cleansing and for drying.  Instructed to apply a light layer of silvadene and to wash old product off between applications.  Both patient and spouse stated understanding after repeating instructions 2 more times.  Given Saline, 2 50 gram jars of Silvadene, sterile 4x4's and gloves. Cautioned that initially the Silvadene may burn but calms down after application.  Advised to stiop using product if Silvadene continues to burn and causes more discomfort.  Both stated understanding.  Will call on tomorrow.

## 2012-02-06 ENCOUNTER — Other Ambulatory Visit: Payer: Self-pay | Admitting: Oncology

## 2012-02-06 ENCOUNTER — Other Ambulatory Visit: Payer: Self-pay | Admitting: *Deleted

## 2012-02-06 ENCOUNTER — Other Ambulatory Visit (HOSPITAL_BASED_OUTPATIENT_CLINIC_OR_DEPARTMENT_OTHER): Payer: PRIVATE HEALTH INSURANCE | Admitting: Lab

## 2012-02-06 ENCOUNTER — Other Ambulatory Visit: Payer: Self-pay | Admitting: Radiation Oncology

## 2012-02-06 ENCOUNTER — Telehealth: Payer: Self-pay | Admitting: Oncology

## 2012-02-06 DIAGNOSIS — I82409 Acute embolism and thrombosis of unspecified deep veins of unspecified lower extremity: Secondary | ICD-10-CM

## 2012-02-06 DIAGNOSIS — C119 Malignant neoplasm of nasopharynx, unspecified: Secondary | ICD-10-CM

## 2012-02-06 LAB — PROTIME-INR: Protime: 20.4 Seconds — ABNORMAL HIGH (ref 10.6–13.4)

## 2012-02-06 MED ORDER — HYDROCODONE-ACETAMINOPHEN 7.5-325 MG/15ML PO SOLN: 15.0000 mL | Freq: Four times a day (QID) | ORAL | Status: DC | PRN

## 2012-02-06 NOTE — Telephone Encounter (Signed)
lvm for pt regarding 11.11.13 appt.Marland KitchenMarland Kitchen

## 2012-02-07 ENCOUNTER — Ambulatory Visit: Payer: PRIVATE HEALTH INSURANCE | Admitting: Radiation Oncology

## 2012-02-07 ENCOUNTER — Other Ambulatory Visit: Payer: Self-pay | Admitting: *Deleted

## 2012-02-10 ENCOUNTER — Other Ambulatory Visit (HOSPITAL_BASED_OUTPATIENT_CLINIC_OR_DEPARTMENT_OTHER): Payer: PRIVATE HEALTH INSURANCE | Admitting: Lab

## 2012-02-10 ENCOUNTER — Encounter: Payer: Self-pay | Admitting: Oncology

## 2012-02-10 ENCOUNTER — Ambulatory Visit: Payer: Self-pay

## 2012-02-10 ENCOUNTER — Telehealth: Payer: Self-pay | Admitting: *Deleted

## 2012-02-10 DIAGNOSIS — I82409 Acute embolism and thrombosis of unspecified deep veins of unspecified lower extremity: Secondary | ICD-10-CM

## 2012-02-10 NOTE — Telephone Encounter (Signed)
Message copied by Wende Mott on Mon Feb 10, 2012  1:18 PM ------      Message from: Clenton Pare R      Created: Mon Feb 10, 2012 11:21 AM       Please call pt/wife. INR is low - I believe he is still on fluconazole so I would like to make only a slight change in Coumadin. Change to 5 mg on Monday only and remains at 2.5 mg all other days of the week. He has an appt this Friday with Dr Gaylyn Rong and we will check it again at that point in time.

## 2012-02-10 NOTE — Telephone Encounter (Signed)
Spoke w/ wife and instructed on INR still a little low and to increase Coumadin to 5 mg today on Monday and then 2.5 mg the rest of the week.  F/u w/ Dr. Gaylyn Rong this Friday.  She verbalized understanding and states pt has 2 days left on the fluconazole.

## 2012-02-10 NOTE — Progress Notes (Signed)
Checked in patient for lab.

## 2012-02-11 ENCOUNTER — Telehealth (HOSPITAL_COMMUNITY): Payer: Self-pay | Admitting: Dental General Practice

## 2012-02-13 ENCOUNTER — Telehealth (HOSPITAL_COMMUNITY): Payer: Self-pay | Admitting: Dental General Practice

## 2012-02-14 ENCOUNTER — Ambulatory Visit: Payer: PRIVATE HEALTH INSURANCE | Admitting: Nutrition

## 2012-02-14 ENCOUNTER — Other Ambulatory Visit (HOSPITAL_BASED_OUTPATIENT_CLINIC_OR_DEPARTMENT_OTHER): Payer: PRIVATE HEALTH INSURANCE | Admitting: Lab

## 2012-02-14 ENCOUNTER — Telehealth: Payer: Self-pay | Admitting: Oncology

## 2012-02-14 ENCOUNTER — Ambulatory Visit (HOSPITAL_BASED_OUTPATIENT_CLINIC_OR_DEPARTMENT_OTHER): Payer: PRIVATE HEALTH INSURANCE | Admitting: Oncology

## 2012-02-14 VITALS — BP 134/91 | HR 96 | Temp 97.7°F | Resp 20 | Ht 67.0 in | Wt 158.2 lb

## 2012-02-14 DIAGNOSIS — I82409 Acute embolism and thrombosis of unspecified deep veins of unspecified lower extremity: Secondary | ICD-10-CM

## 2012-02-14 DIAGNOSIS — C099 Malignant neoplasm of tonsil, unspecified: Secondary | ICD-10-CM

## 2012-02-14 DIAGNOSIS — E441 Mild protein-calorie malnutrition: Secondary | ICD-10-CM

## 2012-02-14 DIAGNOSIS — C119 Malignant neoplasm of nasopharynx, unspecified: Secondary | ICD-10-CM

## 2012-02-14 DIAGNOSIS — K1231 Oral mucositis (ulcerative) due to antineoplastic therapy: Secondary | ICD-10-CM

## 2012-02-14 LAB — COMPREHENSIVE METABOLIC PANEL (CC13)
ALT: 15 U/L (ref 0–55)
AST: 15 U/L (ref 5–34)
Albumin: 2.6 g/dL — ABNORMAL LOW (ref 3.5–5.0)
Calcium: 9.4 mg/dL (ref 8.4–10.4)
Chloride: 98 mEq/L (ref 98–107)
Creatinine: 0.7 mg/dL (ref 0.7–1.3)
Potassium: 3.6 mEq/L (ref 3.5–5.1)

## 2012-02-14 LAB — PROTIME-INR: INR: 2.6 (ref 2.00–3.50)

## 2012-02-14 LAB — CBC WITH DIFFERENTIAL/PLATELET
BASO%: 1 % (ref 0.0–2.0)
EOS%: 0.9 % (ref 0.0–7.0)
HCT: 34.9 % — ABNORMAL LOW (ref 38.4–49.9)
MCH: 33.2 pg (ref 27.2–33.4)
MCHC: 34.7 g/dL (ref 32.0–36.0)
MONO%: 15.2 % — ABNORMAL HIGH (ref 0.0–14.0)
NEUT%: 76.1 % — ABNORMAL HIGH (ref 39.0–75.0)
lymph#: 0.5 10*3/uL — ABNORMAL LOW (ref 0.9–3.3)

## 2012-02-14 NOTE — Telephone Encounter (Signed)
gv and printed appt schedule for pt for NOV, Dec, and Feb 2014..the patient aware that central scheduling will call with appt time and date..the patient aware.

## 2012-02-14 NOTE — Progress Notes (Signed)
Patient presents to nutrition followup. His weight is down slightly to 158.2 pounds November 15 from 161.3 pounds October 28. Patient is tolerating 4-5 Ensure Plus daily along with 40 g of [place] protein powder daily. Patient uses 120 mL free water before and after each bolus feeding. In addition, he is receiving 8 ounces or 240 mL of free water twice a day along with protein powder. Patient appears to be receiving approximately 1600 calories, 92 g protein, 1680 mL free water which is approximately 80% of minimum estimated nutrition needs.  Nutrition diagnosis: Food and nutrition related knowledge deficit continues. Diagnosis of suboptimal enteral nutrition infusion continues.  Intervention: I have educated patient and wife on strategies for increasing tube feedings to a minimum of 5 cans of Ensure Plus daily with 40 additional grams of protein to provide 1950 calories and 105 g protein. This would bring total calories and protein to greater than 90% of minimum needs. Patient was also educated to increase free water by mouth as tolerated and begin some oral liquids and soft moist solids by mouth. Patient able to teach back strategies for increasing tube feeding as well as oral intake. I provided additional coupons and protein supplements for patient.  Monitoring, evaluation, and goals: The patient tolerates bolus tube feedings however has been unable to increase tube feedings to goal rate which has resulted in weight loss. He will work to increase bolus feeds to a minimum of 5 cans of Ensure Plus daily as well as increasing oral intake to promote healing and weight maintenance.  Next visit: Monday, December 30 after doctor's appointment.

## 2012-02-14 NOTE — Patient Instructions (Addendum)
1.  Diagnosis:  Head/neck cancer. 2.  Treatment:  Induction chemo (Taxotere/Cisplatin/5FU) followed by chemoradiation with weekly Carboplatin/Taxol and daily radiation.  Therapy finished 01/27/12.  3.  Plan:  Recovery period.  Follow up PET scan in about 3 months to assess response.  - Nutrition:  Goal:  To convert to all oral and no PEG tube by 3 months.  - Activity - Continue swallowing exercise to decrease risk of esophageal stricture.

## 2012-02-14 NOTE — Progress Notes (Signed)
[facility]  [phone] [phone]   OFFICE PROGRESS NOTE   Cc:  [name], MD  DIAGNOSIS: Recurrent T2 N2b M0 tonsillar cancer.   PAST THERAPY: He underwent in 2011 robotic resection with Dr. Hezzie Bump at [facility] in addition to left neck dissection. He was recommended to receive adjuvant radiation therapy. However, he declined at that time. He developed recurrent disease in May 2013. He received 2 cycles of induction chemo Taxotere, Cisplatin, and 5-FU in June and July 2013. He had very good partial response but grade 3 toxicity with anorexia, weight loss.   He was on concurrent chemoradiation therapy with daily radiation and weekly Carboplatin and Taxol, between 12/16/11 and 01/27/12.  CURRENT THERAPY:  Watchful observation.   INTERVAL HISTORY: Shane Marsh 61 y.o. male returns for regular follow up with his wife.  When he first finished treatment two weeks ago, he had moderate fatigue.  He is able to walk around more now.  He has moderate mucositis. He depends 100% on PEG tube for nutrition now.  His weight slightly decreased.  He needs Lortab elixir about 2-3 times a day.  He denied any more neck mass.  He has less diplopia now compared to before.  He had only one episode of dizziness lately compared to everyday before.   He has thick oral phlegm.  He did not tolerate H2O2.  He has not tried anything else for the phlegm. He denied PEG tube problem.   Patient denies fever, headache, confusion, drenching night sweats, palpable lymph node swelling, jaundice, chest pain, palpitation, shortness of breath, dyspnea on exertion, gum bleeding, epistaxis, hematemesis, hemoptysis, abdominal pain, abdominal swelling, early satiety, melena, hematochezia, hematuria, skin rash, spontaneous bleeding, joint swelling, joint pain, heat or cold intolerance, bowel bladder incontinence, back pain, focal motor weakness, paresthesia, depression, suicidal or homicidal ideation,  feeling hopelessness.   Past Medical History  Diagnosis Date  . Hypertension   . 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 GERD (gastroesophageal reflux disease) 10/15/2011  . Protein calorie malnutrition 10/15/2011  . Status post chemotherapy 10/04/11-10/13/11    taxotere/cisplatin/5FU  . Feeding by G-tube   . S/P radiation therapy 10/13/11 - 10/04/11  . Hx of sinus bradycardia   . DVT, bilateral lower limbs 10/29/11    Hx of  . Depression   . Tonsil cancer 12/04/2011    Past Surgical History  Procedure Date  . Splenectomy   . Tonsillectomy     one side    Current Outpatient Prescriptions  Medication Sig Dispense Refill  . diphenhydrAMINE (BENADRYL) 25 MG tablet Take 25-50 mg by mouth daily as needed. For allergy      . emollient (BIAFINE) cream Apply 1 Tube topically 2 (two) times daily.      . Ensure Plus (ENSURE PLUS) LIQD Begin Ensure Plus 1/2 can via PEG QID with 120 ml free water before and after each bolus feeding.  If tolerated increase to 1 can of Ensure Plus QID on day 3 and 4.  If tolerated, increase to goal of 1.5 cans QID with 120 ml free water before and after each bolus.  In addition, pt is to drink or put an additional 240 ml free water in PEG daily.  Please send supplies and RN to home no later than Saturday, July 20th.  1422 mL  0  . escitalopram (LEXAPRO) 5 MG tablet Take 1 tablet (5 mg total) by mouth every morning.  90 tablet  2  . ferrous sulfate 325 (65 FE) MG tablet Take 325 mg by mouth 3 (three) times daily with meals.       . fludrocortisone (FLORINEF) 0.1 MG tablet Take 1 tablet (0.1 mg total) by mouth daily.  90 tablet  2  . hydrocodone-acetaminophen (HYCET) 7.5-325 MG/15ML solution Take 15 mLs by mouth 4 (four) times daily as needed for pain.  500 mL  0  . midodrine (PROAMATINE) 10 MG tablet Take 5 mg by mouth 3 (three) times daily.      Marland Kitchen nystatin (MYCOSTATIN) 100000 UNIT/ML suspension Take 5 mLs (500,000 Units total) by mouth 4 (four) times  daily.  240 mL  0  . ondansetron (ZOFRAN) 8 MG tablet Take 1 tablet (8 mg total) by mouth 2 (two) times daily as needed (Nausea or vomiting).  30 tablet  1  . prochlorperazine (COMPAZINE) 10 MG tablet Take 1 tablet (10 mg total) by mouth every 6 (six) hours as needed (Nausea or vomiting).  30 tablet  1  . warfarin (COUMADIN) 5 MG tablet Take 1 tablet (5 mg total) by mouth daily.  60 tablet  1    ALLERGIES:  is allergic to morphine and related.  REVIEW OF SYSTEMS:  The rest of the 14-point review of system was negative.   Filed Vitals:   02/14/12 0825  BP: 134/91  Pulse: 96  Temp: 97.7 F (36.5 C)  Resp: 20   Wt Readings from Last 3 Encounters:  02/14/12 158 lb 3.2 oz (71.759 kg)  01/29/12 160 lb 8 oz (72.802 kg)  01/27/12 161 lb 4.8 oz (73.165 kg)   ECOG Performance status: 1  PHYSICAL EXAMINATION:   General: thin-appearing man, in no acute distress.  Eyes:  no scleral icterus.  ENT:  He could not open his mouth very wide due to recent treatment.  I did not visualize oral thrush or ulcer.  Neck was without thyromegaly.  Lymphatics:  Negative cervical, supraclavicular or axillary adenopathy.  Respiratory: lungs were clear bilaterally without wheezing or crackles.  Cardiovascular:  Regular rate and rhythm, S1/S2, without murmur, rub or gallop.  There was no pedal edema.  GI:  abdomen was soft, flat, nontender, nondistended, without organomegaly.  PEG tube dry, clean, intact.  Muscoloskeletal:  no spinal tenderness of palpation of vertebral spine.  Skin exam was without echymosis, petichae.  Neuro exam was nonfocal.  Patient was able to get on and off exam table without assistance.  Gait was normal.  Patient was alerted and oriented.  Attention was good.   Language was appropriate.  Mood was normal without depression.  Speech was not pressured.  Thought content was not tangential.     LABORATORY/RADIOLOGY DATA:  Lab Results  Component Value Date   WBC 7.4 02/14/2012   HGB 12.1*  02/14/2012   HCT 34.9* 02/14/2012   PLT 481* 02/14/2012   GLUCOSE 98 02/14/2012   CHOL 229* 08/29/2011   TRIG 143 08/29/2011   HDL 34* 08/29/2011   LDLCALC 166* 08/29/2011   ALKPHOS 64 02/14/2012   ALT 15 02/14/2012   AST 15 02/14/2012   NA 140 02/14/2012   K 3.6 02/14/2012   CL 98 02/14/2012   CREATININE 0.7 02/14/2012   BUN 13.0 02/14/2012   CO2 33* 02/14/2012   INR 2.60 02/14/2012   HGBA1C 5.9* 08/28/2011      ASSESSMENT AND PLAN:    1. 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  - Impression: 2 cycles of neoadjuvant chemotherapy (without radiation) with very good response. He is s/p concurrent chemoradiation.  He has grade 2 mucositis, grade 1 fatigue, grade 1 weight loss.  - Recommendation: watchful observation.  I arranged for him to have a follow up PET scan about 3 months from now.   2. Mucositis from chemorad:  - I advised him to continue salt/baking soda mouth rinse. I advised him to use Robitusin to thin out his thick phlegm.  He did not tolerate diluted H2O2.  - He is on Lortab elixir prn about 3x/day.  As he is 2 weeks out from the finish of therapy.  There is no indication for Fentanyl patch.   3. Bilateral DVT. He is on Coumadin at 5mg  PO daily.  His INR today is therapeutic at 2.6.  I advised him to continue Coumadin at current dose.  We will check his INR every 2 weeks.  Plan to continue Coumadin until after restaging PET scan in 3 months.  If good result, and patient is more ambulatory, I may consider repeating US doppler and if negative, we may consider stopping Coumadin at that time.   4. Mild protein calorie malnutrition. Slight weight loss. PEG tube dependent.    5 Mild deconditioning: He is stil able to ambulate and perform chores around the house.   6. Hypotension history due to  recurrent cancer:  Continue Florineff until after restaging PET scan.    7. Bradycardia. Resolved.   8. Depression. He is on Lexapro 5 mg daily with stable mood.   9.  Follow up: In about 6 weeks to ensure that he is on the right track of recovery.

## 2012-02-24 ENCOUNTER — Other Ambulatory Visit: Payer: Self-pay | Admitting: Radiation Oncology

## 2012-02-24 DIAGNOSIS — C119 Malignant neoplasm of nasopharynx, unspecified: Secondary | ICD-10-CM

## 2012-02-24 DIAGNOSIS — C099 Malignant neoplasm of tonsil, unspecified: Secondary | ICD-10-CM

## 2012-02-24 MED ORDER — PROCHLORPERAZINE MALEATE 10 MG PO TABS: 10.0000 mg | ORAL_TABLET | Freq: Four times a day (QID) | ORAL | Status: DC | PRN

## 2012-02-24 MED ORDER — HYDROCODONE-ACETAMINOPHEN 7.5-325 MG/15ML PO SOLN: 15.0000 mL | Freq: Four times a day (QID) | ORAL | Status: DC | PRN

## 2012-02-28 ENCOUNTER — Other Ambulatory Visit: Payer: Self-pay | Admitting: Oncology

## 2012-02-28 ENCOUNTER — Telehealth: Payer: Self-pay | Admitting: Oncology

## 2012-02-28 ENCOUNTER — Other Ambulatory Visit (HOSPITAL_BASED_OUTPATIENT_CLINIC_OR_DEPARTMENT_OTHER): Payer: PRIVATE HEALTH INSURANCE | Admitting: Lab

## 2012-02-28 DIAGNOSIS — I82409 Acute embolism and thrombosis of unspecified deep veins of unspecified lower extremity: Secondary | ICD-10-CM

## 2012-02-28 DIAGNOSIS — C099 Malignant neoplasm of tonsil, unspecified: Secondary | ICD-10-CM

## 2012-02-28 LAB — PROTIME-INR

## 2012-02-28 NOTE — Telephone Encounter (Signed)
Spoke with patient.  Stay on same dose of coumadin - pt. Currently taking 5mg  daily.  Expect call from schedulers to make appt. For mid December to recheck PT.  Pt appreciated call.

## 2012-02-28 NOTE — Telephone Encounter (Signed)
Message copied by Orbie Hurst on Fri Feb 28, 2012  9:17 AM ------      Message from: Jethro Bolus T      Created: Fri Feb 28, 2012  8:52 AM       Please call pt.  No change to his Coumadin dose.  Thanks.

## 2012-02-28 NOTE — Telephone Encounter (Signed)
S/w wife re appt for 12/13.

## 2012-03-04 ENCOUNTER — Ambulatory Visit (HOSPITAL_COMMUNITY): Payer: Self-pay | Admitting: Dentistry

## 2012-03-04 ENCOUNTER — Encounter: Payer: Self-pay | Admitting: Radiation Oncology

## 2012-03-04 ENCOUNTER — Encounter (HOSPITAL_COMMUNITY): Payer: Self-pay | Admitting: Dentistry

## 2012-03-04 ENCOUNTER — Telehealth: Payer: Self-pay | Admitting: *Deleted

## 2012-03-04 ENCOUNTER — Ambulatory Visit
Admission: RE | Admit: 2012-03-04 | Discharge: 2012-03-04 | Disposition: A | Discharge status: Home / Self Care | Payer: PRIVATE HEALTH INSURANCE | Source: Ambulatory Visit | Attending: Radiation Oncology | Admitting: Radiation Oncology

## 2012-03-04 VITALS — BP 138/87 | HR 70 | Temp 98.3°F

## 2012-03-04 VITALS — BP 139/93 | HR 70 | Temp 97.9°F | Wt 158.0 lb

## 2012-03-04 DIAGNOSIS — R1310 Dysphagia, unspecified: Secondary | ICD-10-CM

## 2012-03-04 DIAGNOSIS — R432 Parageusia: Secondary | ICD-10-CM

## 2012-03-04 DIAGNOSIS — C099 Malignant neoplasm of tonsil, unspecified: Secondary | ICD-10-CM

## 2012-03-04 DIAGNOSIS — K1230 Oral mucositis (ulcerative), unspecified: Secondary | ICD-10-CM

## 2012-03-04 DIAGNOSIS — R439 Unspecified disturbances of smell and taste: Secondary | ICD-10-CM

## 2012-03-04 DIAGNOSIS — R634 Abnormal weight loss: Secondary | ICD-10-CM

## 2012-03-04 DIAGNOSIS — K1233 Oral mucositis (ulcerative) due to radiation: Secondary | ICD-10-CM

## 2012-03-04 DIAGNOSIS — K121 Other forms of stomatitis: Secondary | ICD-10-CM

## 2012-03-04 MED ORDER — SODIUM FLUORIDE 1.1 % DT GEL: DENTAL | Status: AC

## 2012-03-04 NOTE — Telephone Encounter (Signed)
CALLED PATIENT TO INFORM OF FU APPT. FOR 05-2012, SPOKE WITH PATIENT AND HE IS AWARE OF THIS APPT.

## 2012-03-04 NOTE — Progress Notes (Signed)
FU appointment for assessment following treatment for Tonsillar cancer which completed on 01/30/12.  States his mouth is still sore, but he ate some chicken and mashed potatoes at thanksgiving and he actually could taste his food.  He continues to instill 5 cans of enteral nutrition feeds his peg tube.   The skin on his lower face and neck is very soft and intact with redness.    Redness noted back of throat and tongue.  Thick saliva noted.  Able to open his mouth wider today and he continues to perform his jaw exercises.  He is talked today and his speech is clearer

## 2012-03-04 NOTE — Progress Notes (Signed)
March 04, 2012  BP: 138/87                   P: 70             T: 98.3            Wgt:160 lbs   Shane Marsh is a 61 year old male with History of Left tonsillar cancer. He underwent chemoradiation therapy from 12/16/2011 to 01/30/2012.  The patient now presents for a periodic oral examination after radiation therapy.    REVIEW OF CHIEF COMPLAINTS: DRY MOUTH: Yes. HARD TO SWALLOW: Yes. Patient still has a feeding tube HURT TO SWALLOW: Yes.  TASTE CHANGES: Some taste is returning. SORES IN MOUTH: Tongue is still sore. TRISMUS SYMPTOMS: None. SYMPTOM RELIEF:  Using salt water. HOME ORAL HYGIENE REGIMEN:  BRUSHING: 1X a day FLOSSING: None FLUORIDE: Not doing fluoride at all.  TRISMUS EXERCISES: Maximum interincisal opening:  35 mm.   DENTAL EXAM: ORAL HYGIENE(PLAQUE): Plaque noted. Oral hygiene stressed. LOCATION OF MUCOSITIS: Back of throat and lateral tongue borders. DESCRIPTION OF SALIVA: Decreased saliva. Saliva is ropey and foamy. Moderate xerostomia. ANY EXPOSED BONE: None noted OTHER WATCHED AREAS: Molars in the field of primary radiation therapy. flexure lesions.  DIAGNOSES: 1.  Xerostomia  2.  Dysgeusia 3.  Dysphagia 4.  Odynophagia 5.  Mucositis 6.  Weight Loss 7.  Accretions  RECOMMENDATIONS: 1. Brush at least twice a day if not after meals and at bedtime. Use fluoride at bedtime. Will prescribe PreviDent 5000 Plus as an alternative to the use of FluoroSHIELD in the fluoride trays. 2. Use trismus exercises as directed. 3. Use Biotene Rinse or salt water/baking soda rinses. 4. Multiple sips of water as needed. 5. Follow up with Dr. Sonda Rumble (primary Dentist) in early February of March of 2014 for exam and cleaning. Call if problems before then. Dr. Kristin Bruins

## 2012-03-04 NOTE — Patient Instructions (Addendum)
RECOMMENDATIONS: 1. Brush at least twice a day if not after meals and at bedtime. Use fluoride at bedtime. Will prescribe PreviDent 5000 Plus as an alternative to the use of FluoroSHIELD in the fluoride trays. 2. Use trismus exercises as directed. 3. Use Biotene Rinse or salt water/baking soda rinses. 4. Multiple sips of water as needed. 5. Follow up with Dr. Sonda Rumble (primary Dentist) in early February of March of 2014 for exam and cleaning. Call if problems before then. Dr. Kristin Bruins  RADIATION THERAPY AND DECISIONS REGARDING YOUR TEETH  Xerostomia (dry mouth) Your salivary glands may be in the filed of radiation.  Radiation may include all or part of your saliva glands.  This will cause your saliva to dry up and you will have a dry mouth.  The dry mouth will be for the rest of your life unless your radiation oncologist tells you otherwise.  Your saliva has many functions:  Saliva wets your tongue for speaking.  It coats your teeth and the inside of your mouth for easier movement.  It helps with chewing and swallowing food.  It helps clean away harmful acid and toxic products made by the germs in your mouth, therefore it helps prevent cavities.  It kills some germs in your mouth and helps to prevent gum disease.  It helps to carry flavor to your taste buds.  Once you have lost your saliva you will be at higher risk for tooth decay and gum disease.  What can be done to help improve your mouth when there's not enough saliva:  1.  Your dentist may give a prescription for Salagen.  It will not bring back all of your saliva but may bring back some of it.  Also your saliva may be thick and ropy or white and foamy. It will not feel like it use to feel.  2.  You will need to swish with water every time your mouth feels dry.  YOU CANNOT suck on any cough drops, mints, lemon drops, candy, vitamin C or any other products.  You cannot use anything other than water to make your mouth feel less  dry.  If you want to drink anything else you have to drink it all at once and brush afterwards.  Be sure to discuss the details of your diet habits with your dentist or hygienist.  Radiation caries: This is decay that happens very quickly once your mouth is very dry due to radiation therapy.  Normally cavities take six months to two years to become a problem.  When you have dry mouth cavities may take as little as eight weeks to cause you a problem.  This is why dental check ups every two months are necessary as long as you have a dry mouth. Radiation caries typically, but not always, start at your gum line where it is hard to see the cavity.  It is therefore also hard to fill these cavities adequately.  This high rate of cavities happens because your mouth no longer has saliva and therefore the acid made by the germs starts the decay process.  Whenever you eat anything the germs in your mouth change the food into acid.  The acid then burns a small hole in your tooth.  This small hole is the beginning of a cavity.  If this is not treated then it will grow bigger and become a cavity.  The way to avoid this hole getting bigger is to use fluoride every evening as prescribed by  your dentist.  You have to make sure that your teeth are very clean before you use the fluoride.  This fluoride in turn will strengthen your teeth and prepare them for another day of fighting acid.  If you develop radiation caries many times the damage is so large that you will have to have all your teeth removed.  This could be a big problem if some of these teeth are in the field of radiation.  Further details of why this could be a big problem will follow.  (See Osteoradionecrosis).  Loss of taste (dysgeusia) This happens to varying degrees once you've had radiation therapy to your jaw region.  Many times taste is not completely lost but becomes limited.  The loss of taste is mostly due to radiation affecting your taste buds.  However  if you have no saliva in your mouth to carry the flavor to your taste buds it would be difficult for your taste buds to taste anything.  That is why using water or a prescription for Salagen prior to meals and during meals may help with some of the taste.  Keep in mind that taste generally returns very slowly over the course of several months or several years after radiation therapy.  Don't give up hope.  Trismus According to your Radiation Oncologist your TMJ or jaw joints are going to be partially or fully in the field of radiation.  This means that over time the muscles that help you open and close your mouth may get stiff.  This will potentially result in your not being able to open your mouth wide enough or as wide as you can open it now.  Le me give you an example of how slowly this happens and how unaware people are of it.  A gentlemen that had radiation therapy two years ago came back to me complaining that bananas are just too large for him to be able to fit them in between his teeth.  He was not able to open wide enough to bite into a banana.  This happens slowly and over a period of time.  What do we do to try and prevent this?  Your dentist will probably give you a stack of sticks called a trismus exercise device .  This stack will help your remind your muscles and your jaw joint to open up to the same distance every day.  Use these sticks every morning when you wake up according to the instructions given by the dentist.   You must use these sticks for at least one to two years after radiation therapy.  The reason for that is because it happens so slowly and keeps going on for about two years after radiation therapy.  Your hospital dentist will help you monitor your mouth opening and make sure that it's not getting smaller.  Osteoradionecrosis (ORN) This is a condition where your jaw bone after having had radiation therapy becomes very dry.  It has very little blood supply to keep it alive.  If  you develop a cavity that turns into an abscess or an infection then the jaw bone does not have enough blood supply to help fight the infection.  At this point it is very likely that the infection could cause the death of your jaw bone.  When you have dead bone it has to be removed.  Therefore you might end up having to have surgery to remove part of your jaw bone, the part of the jaw  bone that has been affected.   Healing is also a problem if you are to have surgery in the areas where the bone has had radiation therapy.  The same reasons apply.  If you have surgery you need more blood supply which is not available.  When blood supply and oxygen are not available again, there is a chance for the bone to die.  Occasionally ORN happens on its own with no obvious reason.  This is quite rare.  We believe that patients who continue to smoke and/or drink alcohol have a higher chance of having this bone problem.  Therefore once your jaw bone has had radiation therapy if there are any teeth in that area, you should never have them pulled.  You should also never have any surgery on your teeth or gums in that area unless the oral surgeon or Periodontist is aware of your history of radiation. There is some expensive management techniques that might be used to limit your risks.  The risks for ORN either from infection or spontaneous ( or on it's own) are life long.    TRISMUS  Trismus is a condition where the jaw does not allow the mouth to open as wide as it usually does.  This can happen almost suddenly, or in other cases the process is so slow, it is hard to notice it-until it is too far along.  When the jaw joints and/or muscles have been exposed to radiation treatments, the onset of Trismus is very slow.  This is because the muscles are losing their stretching ability over a long period of time, as long as 2 YEARS after the end of radiation.  It is therefore important to exercise these muscles and  joints.  TRISMUS EXERCISES   Stack of tongue depressors measuring the same or a little less than the last documented MIO (Maximum Interincisal Opening).  Secure them with a rubber band on both ends.  Place the stack in the patient's mouth, supporting the other end.  Allow 30 seconds for muscle stretching.  Rest for a few seconds.  Repeat 3-5 times  For all radiation patients, this exercise is recommended in the mornings and evenings unless otherwise instructed.  The exercise should be done for a period of 2 YEARS after the end of radiation.  MIO should be checked routinely on recall dental visits by the general dentist or the hospital dentist.  The patient is advised to report any changes, soreness, or difficulties encountered when doing the exercises.  FLUORIDE TRAYS PATIENT INSTRUCTIONS    Obtain prescription from the pharmacy.  Don't be surprised if it needs to be ordered.   Be sure to let the pharmacy know when you are close to needing a new refill for them to have it ready for you without interruption of Fluoride use.   The best time to use your Fluoride is before bed time.   You must brush your teeth very well and floss before using the Fluoride in order to get the best use out of the Fluoride treatments.   Place 1 drop of Fluoride gel per tooth in the tray.   Place the tray on your lower teeth and/or your upper teeth.  Make sure the trays are seated all the way.  Remember, they only fit one way on your teeth.   Insert for 5 full minutes.   At the end of the 5 minutes, take the trays out.  SPIT OUT excess. .    Do NOT  rinse your mouth!  Do NOT eat or drink after treatments for at least 30 minutes.  This is why the best time for your treatments is before bedtime.  Clean the inside of your Fluoride trays using COLD WATER and a toothbrush.  In order to keep your Trays from discoloring and free from odors, soak them overnight in denture cleaners such as  Efferdent.  Do not use bleach or non denture products.  Store the trays in a safe dry place AWAY from any heat until your next treatment.  Bring the trays with you for your next dental check-up.  The dentist will confirm their fit.  If anything happens to your Fluoride trays, or they don't fit as well after any dental work, please let us know as soon as possible.

## 2012-03-04 NOTE — Progress Notes (Signed)
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  Narrative:  The patient returns today for routine follow-up.             His mouth is still sore, but he is eating chicken and mashed potatoes and starting to taste his food. He instills 5 cans of enteral nutrition through his PEG tube. His skin is healing. He has thick saliva. He continues trismus exercises.  He'll undergo a PET scan for followup in approximately 2 and half months as ordered by medical oncology.  Overall, strength is improving. Energy improving. He is pleased with how he is doing. He is using his fluoride trays.  ALLERGIES:  is allergic to morphine and related.  Meds: Current Outpatient Prescriptions  Medication Sig Dispense Refill  . diphenhydrAMINE (BENADRYL) 25 MG tablet Take 25-50 mg by mouth daily as needed. For allergy      . emollient (BIAFINE) cream Apply 1 Tube topically 2 (two) times daily.      . Ensure Plus (ENSURE PLUS) LIQD Begin Ensure Plus 1/2 can via PEG QID with 120 ml free water before and after each bolus feeding.  If tolerated increase to 1 can of Ensure Plus QID on day 3 and 4.  If tolerated, increase to goal of 1.5 cans QID with 120 ml free water before and after each bolus.  In addition, pt is to drink or put an additional 240 ml free water in PEG daily.  Please send supplies and RN to home no later than Saturday, July 20th.  1422 mL  0  . escitalopram (LEXAPRO) 5 MG tablet Take 1 tablet (5 mg total) by mouth every morning.  90 tablet  2  . ferrous sulfate  325 (65 FE) MG tablet Take 325 mg by mouth 3 (three) times daily with meals.       . fludrocortisone (FLORINEF) 0.1 MG tablet Take 1 tablet (0.1 mg total) by mouth daily.  90 tablet  2  . hydrocodone-acetaminophen (HYCET) 7.5-325 MG/15ML solution Take 15 mLs by mouth 4 (four) times daily as needed for pain.  500 mL  0  . midodrine (PROAMATINE) 10 MG tablet Take 5 mg by mouth 3 (three) times daily.      . ondansetron (ZOFRAN) 8 MG tablet Take 1 tablet (8 mg total) by mouth 2 (two) times daily as needed (Nausea or vomiting).  30 tablet  1  . prochlorperazine (COMPAZINE) 10 MG tablet Take 1 tablet (10 mg total) by mouth every 6 (six) hours as needed (Nausea or vomiting).  45 tablet  1  . sodium fluoride (PREVIDENT) 1.1 % GEL dental gel Place  small amount of gel on toothbrush. Brush for 2 minutes at bedtime. Spit out excess. Do not rinse afterwards.  120 mL  prn  . warfarin (COUMADIN) 5 MG tablet TAKE 1 TABLET BY MOUTH ONCE DAILY  30 tablet  0    Physical Findings: The patient is in no acute distress. Patient is alert and oriented.  weight is 158 lb (71.668 kg). His temperature is  97.9 F (36.6 C). His blood pressure is 139/93 and his pulse is 70. . Skin has healed well over his neck. Skin is intact. Cerumen in his ear canals bilaterally with decreased hearing b/l. He has some early lymphedema in the anterior neck tissues. No palpable lymphadenopathy in the cervical or supraclavicular regions. Extraocular movements are intact. Vision is grossly intact. He has no ptosis. Oropharynx reveals erythematous mucosa and small patch of white mucositis in the posterior buccal mucosa of the left oral cavity.  Lab Findings: Lab Results  Component Value Date   WBC 7.4 02/14/2012   HGB 12.1* 02/14/2012   HCT 34.9* 02/14/2012   MCV 95.7 02/14/2012   PLT 481* 02/14/2012    CMP     Component Value Date/Time   NA 140 02/14/2012 0809   NA 138 11/06/2011 1020   K 3.6 02/14/2012 0809   K 3.8 11/06/2011 1020   CL  98 02/14/2012 0809   CL 103 11/06/2011 1020   CO2 33* 02/14/2012 0809   CO2 26 11/06/2011 1020   GLUCOSE 98 02/14/2012 0809   GLUCOSE 85 11/06/2011 1020   BUN 13.0 02/14/2012 0809   BUN 15 11/06/2011 1020   CREATININE 0.7 02/14/2012 0809   CREATININE 0.49* 11/06/2011 1020   CALCIUM 9.4 02/14/2012 0809   CALCIUM 8.7 11/06/2011 1020   PROT 6.5 02/14/2012 0809   PROT 4.9* 11/06/2011 1020   ALBUMIN 2.6* 02/14/2012 0809   ALBUMIN 3.0* 11/06/2011 1020   AST 15 02/14/2012 0809   AST 17 11/06/2011 1020   ALT 15 02/14/2012 0809   ALT 40 11/06/2011 1020   ALKPHOS 64 02/14/2012 0809   ALKPHOS 70 11/06/2011 1020   BILITOT 0.29 02/14/2012 0809   BILITOT 0.3 11/06/2011 1020   GFRNONAA >90 09/20/2011 0443   GFRAA >90 09/20/2011 0443      Radiographic Findings: No results found.  Impression/Plan:  Doing well. Healing well from radiotherapy. No evidence of disease at this point in time. Continue with PET scan as scheduled. I will see him back in 3 months. I encouraged him to continue trismus exercises, good dental hygiene. I've encouraged him to take as much in by mouth as possible to wean off of his PEG tube. He does not need any refills on his hydrocodone elixir which he is using only once or twice a day.   He's been encouraged to call if he has issues before his three-month followup.  I encouraged him to make a followup appointment with Dr. Hezzie Bump at [facility]. This will allow him to have the cerumen removed from his ear canals and also continue surveillance with his surgeon.  He defers any appointments with physical therapy for the lymphedema in his neck at this time. _____________________________________   Lonie Peak, MD

## 2012-03-05 ENCOUNTER — Telehealth: Payer: Self-pay | Admitting: Oncology

## 2012-03-05 NOTE — Telephone Encounter (Signed)
s/w pt and moved his appts to later in the day to make room for tx pt,pt aware     anne

## 2012-03-09 ENCOUNTER — Encounter (HOSPITAL_COMMUNITY): Payer: Self-pay | Admitting: Dentistry

## 2012-03-13 ENCOUNTER — Other Ambulatory Visit (HOSPITAL_BASED_OUTPATIENT_CLINIC_OR_DEPARTMENT_OTHER): Payer: PRIVATE HEALTH INSURANCE

## 2012-03-13 ENCOUNTER — Telehealth: Payer: Self-pay | Admitting: *Deleted

## 2012-03-13 DIAGNOSIS — C099 Malignant neoplasm of tonsil, unspecified: Secondary | ICD-10-CM

## 2012-03-13 DIAGNOSIS — I82409 Acute embolism and thrombosis of unspecified deep veins of unspecified lower extremity: Secondary | ICD-10-CM

## 2012-03-13 NOTE — Telephone Encounter (Signed)
Message copied by Wende Mott on Fri Mar 13, 2012  2:58 PM ------      Message from: Jethro Bolus T      Created: Fri Mar 13, 2012  2:50 PM       Please call pt.  No change to current dose of Coumadin.  Thanks.

## 2012-03-13 NOTE — Telephone Encounter (Signed)
Called pt and spoke w/ wife. Gave her INR results and instructed pt to continue current dose of coumadin at 5 mg daily.  Keep next lab on 12/30 as scheduled.  She verbalized understanding.

## 2012-03-30 ENCOUNTER — Other Ambulatory Visit (HOSPITAL_BASED_OUTPATIENT_CLINIC_OR_DEPARTMENT_OTHER): Payer: PRIVATE HEALTH INSURANCE | Admitting: Lab

## 2012-03-30 ENCOUNTER — Other Ambulatory Visit: Payer: Self-pay | Admitting: Oncology

## 2012-03-30 ENCOUNTER — Encounter: Payer: Self-pay | Admitting: Nutrition

## 2012-03-30 ENCOUNTER — Other Ambulatory Visit: Payer: Self-pay | Admitting: Lab

## 2012-03-30 ENCOUNTER — Ambulatory Visit: Payer: Self-pay | Admitting: Oncology

## 2012-03-30 ENCOUNTER — Ambulatory Visit (HOSPITAL_COMMUNITY)
Admission: RE | Admit: 2012-03-30 | Discharge: 2012-03-30 | Disposition: A | Discharge status: Home / Self Care | Payer: PRIVATE HEALTH INSURANCE | Source: Ambulatory Visit | Attending: Oncology | Admitting: Oncology

## 2012-03-30 ENCOUNTER — Ambulatory Visit: Payer: PRIVATE HEALTH INSURANCE | Admitting: Nutrition

## 2012-03-30 ENCOUNTER — Ambulatory Visit (HOSPITAL_BASED_OUTPATIENT_CLINIC_OR_DEPARTMENT_OTHER): Payer: PRIVATE HEALTH INSURANCE | Admitting: Oncology

## 2012-03-30 VITALS — BP 99/73 | HR 89 | Temp 97.7°F | Resp 20 | Ht 67.0 in | Wt 155.0 lb

## 2012-03-30 DIAGNOSIS — C099 Malignant neoplasm of tonsil, unspecified: Secondary | ICD-10-CM

## 2012-03-30 DIAGNOSIS — C119 Malignant neoplasm of nasopharynx, unspecified: Secondary | ICD-10-CM

## 2012-03-30 DIAGNOSIS — I82409 Acute embolism and thrombosis of unspecified deep veins of unspecified lower extremity: Secondary | ICD-10-CM

## 2012-03-30 LAB — CBC WITH DIFFERENTIAL/PLATELET
Basophils Absolute: 0 10*3/uL (ref 0.0–0.1)
Eosinophils Absolute: 0.1 10*3/uL (ref 0.0–0.5)
HCT: 34.4 % — ABNORMAL LOW (ref 38.4–49.9)
HGB: 11.9 g/dL — ABNORMAL LOW (ref 13.0–17.1)
LYMPH%: 10.2 % — ABNORMAL LOW (ref 14.0–49.0)
MCHC: 34.6 g/dL (ref 32.0–36.0)
MONO#: 0.8 10*3/uL (ref 0.1–0.9)
NEUT#: 5.5 10*3/uL (ref 1.5–6.5)
NEUT%: 76.3 % — ABNORMAL HIGH (ref 39.0–75.0)
Platelets: 310 10*3/uL (ref 140–400)
WBC: 7.2 10*3/uL (ref 4.0–10.3)

## 2012-03-30 LAB — COMPREHENSIVE METABOLIC PANEL (CC13)
ALT: <6 U/L (ref 0–55)
Albumin: 3.3 g/dL — ABNORMAL LOW (ref 3.5–5.0)
CO2: 29 mEq/L (ref 22–29)
Glucose: 85 mg/dl (ref 70–99)
Potassium: 4.7 mEq/L (ref 3.5–5.1)
Sodium: 139 mEq/L (ref 136–145)
Total Protein: 6.4 g/dL (ref 6.4–8.3)

## 2012-03-30 LAB — PROTIME-INR

## 2012-03-30 MED ORDER — HEPARIN SOD (PORK) LOCK FLUSH 100 UNIT/ML IV SOLN
500.0000 [IU] | Freq: Once | INTRAVENOUS | Status: AC
  Administered 2012-03-30: 500 [IU] via INTRAVENOUS
  Filled 2012-03-30: qty 5

## 2012-03-30 MED ORDER — SODIUM CHLORIDE 0.9 % IJ SOLN
10.0000 mL | INTRAMUSCULAR | Status: DC | PRN
  Administered 2012-03-30: 10 mL via INTRAVENOUS
  Filled 2012-03-30: qty 10

## 2012-03-30 NOTE — Progress Notes (Signed)
Patient reports that he is eating once a day and describes tolerating pinto beans, beef stew, cabbage, and scrambled eggs with gravy. He describes having to learn to eat all over again. His weight has declined to 155 pounds from 158.2 pounds in November. Patient reports he continues to use 5-6 cans of Ensure Plus via PEG daily. He is no longer using Unjury protein powder.  Nutrition diagnosis: Food and nutrition related knowledge deficit improved. Diagnosis of suboptimal enteral nutrition infusion improved.  Intervention: I have educated patient and wife on strategies for increasing oral intake utilizing high protein foods throughout the day. He should continue Ensure Plus via feeding tube daily until oral intake has improved. I've educated him on how to increase food intake while decreasing tube feeding. Patient able to teach back nutrition education.  Monitoring, evaluation, and goals: The patient is tolerating tube feedings and has begun to increase oral intake but has had a slight weight loss. He will work to increase his oral intake while decreasing tube feedings to promote continued healing and minimal weight loss.  Next visit: Wednesday, February 19 after M.D. appointment.

## 2012-03-30 NOTE — Progress Notes (Signed)
[facility]  [phone] [phone]   OFFICE PROGRESS NOTE   Cc:  [name], MD  DIAGNOSIS: Recurrent T2 N2b M0 tonsillar cancer.   PAST THERAPY: He underwent in 2011 robotic resection with Dr. Hezzie Bump at [facility] in addition to left neck dissection. He was recommended to receive adjuvant radiation therapy. However, he declined at that time. He developed recurrent disease in May 2013. He received 2 cycles of induction chemo Taxotere, Cisplatin, and 5-FU in June and July 2013. He had very good partial response but grade 3 toxicity with anorexia, weight loss.   He was on concurrent chemoradiation therapy with daily radiation and weekly Carboplatin and Taxol, between 12/16/11 and 01/27/12.  CURRENT THERAPY:  Watchful observation.   INTERVAL HISTORY: Shane Marsh 61 y.o. male returns for regular follow up with his wife.  Fatigue has been improving now that he is off treatment.  He is able to walk around more now.  Able to eat soft foods by mouth and still taking 5-6 cans of Ensure through PEG tube. Weight is stable. No longer requires pain medication. He denied any more neck mass.  He has less diplopia now compared to before. He has intermittent dizziness, but only when he chnages position quickly.  He has thick oral phlegm.  He did not tolerate H2O2.  He has not tried anything else for the phlegm. He denied PEG tube problem.   Patient denies fever, headache, confusion, drenching night sweats, palpable lymph node swelling, jaundice, chest pain, palpitation, shortness of breath, dyspnea on exertion, gum bleeding, epistaxis, hematemesis, hemoptysis, abdominal pain, abdominal swelling, early satiety, melena, hematochezia, hematuria, skin rash, spontaneous bleeding, joint swelling, joint pain, heat or cold intolerance, bowel bladder incontinence, back pain, focal motor weakness, paresthesia, depression, suicidal or homicidal ideation, feeling hopelessness.   Past  Medical History  Diagnosis Date  . Hypertension   . 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 GERD (gastroesophageal reflux disease) 10/15/2011  . Protein calorie malnutrition 10/15/2011  . Status post chemotherapy 10/04/11-10/13/11    taxotere/cisplatin/5FU  . Feeding by G-tube   . S/P radiation therapy 10/13/11 - 10/04/11  . Hx of sinus bradycardia   . DVT, bilateral lower limbs 10/29/11    Hx of  . Depression   . Tonsil cancer 12/04/2011    S/P XRT 12/16/11 thru 01/30/12    Past Surgical History  Procedure Date  . Splenectomy   . Tonsillectomy     one side    Current Outpatient Prescriptions  Medication Sig Dispense Refill  . Ensure Plus (ENSURE PLUS) LIQD Begin Ensure Plus 1/2 can via PEG QID with 120 ml free water before and after each bolus feeding.  If tolerated increase to 1 can of Ensure Plus QID on day 3 and 4.  If tolerated, increase to goal of 1.5 cans QID with 120 ml free water before and after each bolus.  In addition, pt is to drink or put an additional 240 ml free water in PEG daily.  Please send supplies and RN to home no later than Saturday, July 20th.  1422 mL  0  . escitalopram (LEXAPRO) 5 MG tablet Take 1 tablet (5 mg total) by mouth every morning.  90 tablet  2  . ferrous sulfate 325 (65 FE) MG tablet Take 325 mg by mouth 3 (three) times daily with meals.       . fludrocortisone (FLORINEF) 0.1 MG tablet Take 1 tablet (0.1 mg  total) by mouth daily.  90 tablet  2  . midodrine (PROAMATINE) 10 MG tablet Take 5 mg by mouth 3 (three) times daily.      . ondansetron (ZOFRAN) 8 MG tablet Take 1 tablet (8 mg total) by mouth 2 (two) times daily as needed (Nausea or vomiting).  30 tablet  1  . prochlorperazine (COMPAZINE) 10 MG tablet Take 1 tablet (10 mg total) by mouth every 6 (six) hours as needed (Nausea or vomiting).  45 tablet  1  . sodium fluoride (PREVIDENT) 1.1 % GEL dental gel Place  small amount of gel on toothbrush. Brush for 2 minutes at bedtime. Spit  out excess. Do not rinse afterwards.  120 mL  prn  . warfarin (COUMADIN) 5 MG tablet TAKE 1 TABLET BY MOUTH ONCE DAILY  30 tablet  0   Current Facility-Administered Medications  Medication Dose Route Frequency Provider Last Rate Last Dose  . sodium chloride 0.9 % injection 10 mL  10 mL Intravenous PRN Myrtis Ser, NP   10 mL at 03/30/12 1354    ALLERGIES:  is allergic to morphine and related.  REVIEW OF SYSTEMS:  The rest of the 14-point review of system was negative.   Filed Vitals:   03/30/12 1436  BP: 99/73  Pulse: 89  Temp:   Resp:    Wt Readings from Last 3 Encounters:  03/30/12 155 lb (70.308 kg)  03/04/12 158 lb (71.668 kg)  02/14/12 158 lb 3.2 oz (71.759 kg)   ECOG Performance status: 1  PHYSICAL EXAMINATION:   General: thin-appearing man, in no acute distress.  Eyes:  no scleral icterus.  ENT:  He could not open his mouth very wide due to recent treatment.  I did not visualize oral thrush or ulcer.  Neck was without thyromegaly.  Lymphatics:  Negative cervical, supraclavicular or axillary adenopathy.  Respiratory: lungs were clear bilaterally without wheezing or crackles.  Cardiovascular:  Regular rate and rhythm, S1/S2, without murmur, rub or gallop.  There was no pedal edema.  GI:  abdomen was soft, flat, nontender, nondistended, without organomegaly.  PEG tube dry, clean, intact.  Muscoloskeletal:  no spinal tenderness of palpation of vertebral spine.  Skin exam was without echymosis, petichae.  Neuro exam was nonfocal.  Patient was able to get on and off exam table without assistance.  Gait was normal.  Patient was alerted and oriented.  Attention was good.   Language was appropriate.  Mood was normal without depression.  Speech was not pressured.  Thought content was not tangential.     LABORATORY/RADIOLOGY DATA:  Lab Results  Component Value Date   WBC 7.2 03/30/2012   HGB 11.9* 03/30/2012   HCT 34.4* 03/30/2012   PLT 310 03/30/2012   GLUCOSE 85 03/30/2012     CHOL 229* 08/29/2011   TRIG 143 08/29/2011   HDL 34* 08/29/2011   LDLCALC 166* 08/29/2011   ALKPHOS 55 03/30/2012   ALT <6 Repeated and Verified 03/30/2012   AST 12 03/30/2012   NA 139 03/30/2012   K 4.7 03/30/2012   CL 102 03/30/2012   CREATININE 0.8 03/30/2012   BUN 9.0 03/30/2012   CO2 29 03/30/2012   INR 1.30* 03/30/2012   HGBA1C 5.9* 08/28/2011      ASSESSMENT AND PLAN:    1. 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  - Impression: 2  cycles of neoadjuvant chemotherapy (without radiation) with very good response. He is s/p concurrent chemoradiation.  Side effects are slowly improving.  - Recommendation: watchful observation.  He has a PET scan scheduled for 2 months from now.   2. Mucositis from chemorad: Improving. Continue salt/baking soda rinses. He is off pain medication now.  3. Bilateral DVT. He is on Coumadin at 5mg  PO daily.  His INR today is sub therapeutic at 1.3.  Change dose to 7.5 mg on Monday, Wednesday, and Friday and 5 mg all other days. Recheck INR in 1 week then every 2 weeks thereafter. Plan to continue Coumadin until after restaging PET scan in 3 months.  If good result, and patient is more ambulatory, I may consider repeating US doppler and if negative, we may consider stopping Coumadin at that time.   4. Mild protein calorie malnutrition. Weight stable. Remains on Ensure via PEG. Now starting to eat soft foods. He is followed by Vernell Leep.    5 Mild deconditioning: He is stil able to ambulate and perform chores around the house.   6. 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    7. Bradycardia. Resolved.   8. Depression. He is on Lexapro 5 mg daily with stable mood.   9.  Follow up: In about 2 months after PET scan. Will  need PAC flushed again at next visit.

## 2012-03-30 NOTE — Patient Instructions (Addendum)
Coumadin: Change Coumadin dose to 7.5 mg on Monday, Wednesday, and Friday. Continue 5 mg all other days.

## 2012-04-06 ENCOUNTER — Telehealth: Payer: Self-pay | Admitting: *Deleted

## 2012-04-06 ENCOUNTER — Other Ambulatory Visit: Payer: Self-pay | Admitting: Oncology

## 2012-04-06 ENCOUNTER — Telehealth: Payer: Self-pay | Admitting: Oncology

## 2012-04-06 ENCOUNTER — Other Ambulatory Visit (HOSPITAL_BASED_OUTPATIENT_CLINIC_OR_DEPARTMENT_OTHER): Payer: PRIVATE HEALTH INSURANCE

## 2012-04-06 DIAGNOSIS — I82409 Acute embolism and thrombosis of unspecified deep veins of unspecified lower extremity: Secondary | ICD-10-CM

## 2012-04-06 DIAGNOSIS — C119 Malignant neoplasm of nasopharynx, unspecified: Secondary | ICD-10-CM

## 2012-04-06 LAB — PROTIME-INR
INR: 1.5 — ABNORMAL LOW (ref 2.00–3.50)
Protime: 18 Seconds — ABNORMAL HIGH (ref 10.6–13.4)

## 2012-04-06 NOTE — Telephone Encounter (Signed)
Message copied by Wende Mott on Mon Apr 06, 2012  3:13 PM ------      Message from: Clenton Pare R      Created: Mon Apr 06, 2012  2:09 PM       INR is still low. Pt should be on Coumadin 7.5 mg M, W, F and 5 mg all other days. Please verify. If he is taking this dose, then increase to 7.5 mg daily. I have sent a POF to scheduling to recheck INR in 1 week.

## 2012-04-06 NOTE — Telephone Encounter (Signed)
advised pt's wife on 1.13.14 lab only appt.Marland KitchenMarland KitchenMarland KitchenMarland Kitchenpt ok and aware

## 2012-04-06 NOTE — Telephone Encounter (Signed)
Called and spoke w/ wife.  She states pt has been taking 7.5 mg Monday thru Friday (not M, W, F as instructed) and 5 mg on Sat and Sunday.  Informed Belenda Cruise of above and she instructed to still increase to 7.5 mg daily.  Recheck next week.  Informed wife to expect call from scheduling regarding lab appt next Monday.  Instructed to have pt avoid leafy green vegetables high in vitamin K.  She verbalized understanding.

## 2012-04-13 ENCOUNTER — Telehealth: Payer: Self-pay | Admitting: *Deleted

## 2012-04-13 ENCOUNTER — Other Ambulatory Visit (HOSPITAL_BASED_OUTPATIENT_CLINIC_OR_DEPARTMENT_OTHER): Payer: PRIVATE HEALTH INSURANCE

## 2012-04-13 DIAGNOSIS — I82409 Acute embolism and thrombosis of unspecified deep veins of unspecified lower extremity: Secondary | ICD-10-CM

## 2012-04-13 LAB — PROTIME-INR

## 2012-04-13 NOTE — Telephone Encounter (Signed)
Message copied by Wende Mott on Mon Apr 13, 2012  3:53 PM ------      Message from: Clenton Pare R      Created: Mon Apr 13, 2012  2:55 PM       Please call pt. INR is better, but still low. Recommend that he increase Coumadin to 10 mg on Monday only and remain at 7.5 mg all other days. Recheck INR 1/20 as scheduled.

## 2012-04-13 NOTE — Telephone Encounter (Signed)
Called pt and s/w wife.  Instructed on INR result and to increase coumadin to 10 mg today, on Monday only and then 7.5 mg daily the rest of the week.   Lab next Monday as scheduled.  She verbalized understanding.

## 2012-04-17 ENCOUNTER — Other Ambulatory Visit: Payer: Self-pay | Admitting: *Deleted

## 2012-04-17 DIAGNOSIS — I82409 Acute embolism and thrombosis of unspecified deep veins of unspecified lower extremity: Secondary | ICD-10-CM

## 2012-04-20 ENCOUNTER — Other Ambulatory Visit (HOSPITAL_BASED_OUTPATIENT_CLINIC_OR_DEPARTMENT_OTHER): Payer: PRIVATE HEALTH INSURANCE

## 2012-04-20 ENCOUNTER — Telehealth: Payer: Self-pay | Admitting: *Deleted

## 2012-04-20 DIAGNOSIS — I82409 Acute embolism and thrombosis of unspecified deep veins of unspecified lower extremity: Secondary | ICD-10-CM

## 2012-04-20 LAB — PROTIME-INR: INR: 2.2 (ref 2.00–3.50)

## 2012-04-20 NOTE — Telephone Encounter (Signed)
Message copied by Reesa Chew on Mon Apr 20, 2012  2:06 PM ------      Message from: Clenton Pare R      Created: Mon Apr 20, 2012 12:37 PM       Please call pt. Continue current dose of Coumadin. Recheck 2/3 as scheduled.

## 2012-04-20 NOTE — Telephone Encounter (Signed)
Spoke with patient, continue current dose of coumadin and keep regularly scheduled appt. Patient verbalized understanding.

## 2012-04-22 ENCOUNTER — Telehealth: Payer: Self-pay | Admitting: *Deleted

## 2012-04-22 NOTE — Telephone Encounter (Signed)
Notified patient to stay on same dose of coumadin, and to keep 05/04/12 appt.

## 2012-04-22 NOTE — Telephone Encounter (Signed)
Message copied by Reesa Chew on Wed Apr 22, 2012 11:11 AM ------      Message from: Clenton Pare R      Created: Mon Apr 20, 2012 12:37 PM       Please call pt. Continue current dose of Coumadin. Recheck 2/3 as scheduled.

## 2012-04-23 ENCOUNTER — Telehealth: Payer: Self-pay | Admitting: *Deleted

## 2012-04-23 NOTE — Telephone Encounter (Signed)
Calling to follow up on status of forms he left couple weeks ago regarding life insurance policy. Need these by 05/02/12. Made him aware will follow up with managed care and have them call him.

## 2012-04-27 ENCOUNTER — Other Ambulatory Visit: Payer: Self-pay | Admitting: *Deleted

## 2012-04-27 ENCOUNTER — Other Ambulatory Visit: Payer: Self-pay | Admitting: Oncology

## 2012-04-27 NOTE — Telephone Encounter (Signed)
DR.HA HAD REFILLED PT.'S COUMADIN ELECTRONICALLY. PT. ALSO WANTED TO KNOW ABOUT HIS INSURANCE PAPERS. ELIZABETH SUTTON IN MANAGED CARE WOULD TRIED TO COMPLETE TODAY. PT. NOTIFIED.

## 2012-05-04 ENCOUNTER — Other Ambulatory Visit: Payer: Self-pay | Admitting: Oncology

## 2012-05-04 ENCOUNTER — Telehealth: Payer: Self-pay | Admitting: Oncology

## 2012-05-04 ENCOUNTER — Other Ambulatory Visit (HOSPITAL_BASED_OUTPATIENT_CLINIC_OR_DEPARTMENT_OTHER): Payer: PRIVATE HEALTH INSURANCE

## 2012-05-04 DIAGNOSIS — I82409 Acute embolism and thrombosis of unspecified deep veins of unspecified lower extremity: Secondary | ICD-10-CM

## 2012-05-04 LAB — PROTIME-INR: Protime: 50.4 Seconds — ABNORMAL HIGH (ref 10.6–13.4)

## 2012-05-04 NOTE — Telephone Encounter (Signed)
LMONVM ADVISING THE PT OF HIS LAB APPT ON 05/13/2012@9 :45AM

## 2012-05-05 NOTE — Progress Notes (Signed)
Spoke to patient concerning INR level today; unable to reach patient yesterday (05/04/12), patient originally to hold dosage for Monday and Tuesday (2/3-2/4), per Clenton Pare, NP; patient stated that he did take Coumadin 10mg  on Monday (05/04/12); lab results reviewed and patient told to hold Coumadin 7.5mg  dosage today and tomorrow (05/05/12 & 05/06/12) to resume Coumadin 7.5mg  daily on Thursday (05/07/12) and everyday afterwards, per Clenton Pare, NP; patient verbalized understanding.

## 2012-05-07 ENCOUNTER — Other Ambulatory Visit (HOSPITAL_COMMUNITY): Payer: Self-pay

## 2012-05-08 ENCOUNTER — Other Ambulatory Visit: Payer: Self-pay | Admitting: Lab

## 2012-05-08 ENCOUNTER — Ambulatory Visit: Payer: Self-pay | Admitting: Oncology

## 2012-05-13 ENCOUNTER — Other Ambulatory Visit (HOSPITAL_BASED_OUTPATIENT_CLINIC_OR_DEPARTMENT_OTHER): Payer: PRIVATE HEALTH INSURANCE

## 2012-05-13 ENCOUNTER — Telehealth: Payer: Self-pay | Admitting: *Deleted

## 2012-05-13 DIAGNOSIS — C099 Malignant neoplasm of tonsil, unspecified: Secondary | ICD-10-CM

## 2012-05-13 DIAGNOSIS — I82409 Acute embolism and thrombosis of unspecified deep veins of unspecified lower extremity: Secondary | ICD-10-CM

## 2012-05-13 LAB — CBC WITH DIFFERENTIAL/PLATELET
Eosinophils Absolute: 0.1 10*3/uL (ref 0.0–0.5)
HCT: 37.6 % — ABNORMAL LOW (ref 38.4–49.9)
LYMPH%: 14.5 % (ref 14.0–49.0)
MCV: 95.8 fL (ref 79.3–98.0)
MONO#: 0.6 10*3/uL (ref 0.1–0.9)
MONO%: 10.6 % (ref 0.0–14.0)
NEUT#: 4.2 10*3/uL (ref 1.5–6.5)
NEUT%: 71.9 % (ref 39.0–75.0)
Platelets: 284 10*3/uL (ref 140–400)
RBC: 3.92 10*6/uL — ABNORMAL LOW (ref 4.20–5.82)
WBC: 5.8 10*3/uL (ref 4.0–10.3)

## 2012-05-13 LAB — COMPREHENSIVE METABOLIC PANEL (CC13)
Alkaline Phosphatase: 49 U/L (ref 40–150)
BUN: 13.2 mg/dL (ref 7.0–26.0)
CO2: 30 mEq/L — ABNORMAL HIGH (ref 22–29)
Creatinine: 0.7 mg/dL (ref 0.7–1.3)
Glucose: 94 mg/dl (ref 70–99)
Sodium: 139 mEq/L (ref 136–145)
Total Bilirubin: 0.42 mg/dL (ref 0.20–1.20)
Total Protein: 6.9 g/dL (ref 6.4–8.3)

## 2012-05-13 LAB — PROTIME-INR: Protime: 25.2 Seconds — ABNORMAL HIGH (ref 10.6–13.4)

## 2012-05-13 NOTE — Telephone Encounter (Signed)
Spoke w/ pt and informed of INR therapeutic, continue same dose, 7.5 mg daily of Coumadin, and keep appt next week as scheduled for lab and office visit.  He verbalized understanding.

## 2012-05-13 NOTE — Telephone Encounter (Signed)
Message copied by Wende Mott on Wed May 13, 2012 11:56 AM ------      Message from: HA, Raliegh Ip T      Created: Wed May 13, 2012 10:20 AM       Please advise pt to continue Coumadin at current dose.  Thanks. ------

## 2012-05-13 NOTE — Telephone Encounter (Signed)
Left VM on cell phone requesting pt or wife return nurse's call regarding INR result and coumadin.

## 2012-05-15 ENCOUNTER — Encounter: Payer: Self-pay | Admitting: Oncology

## 2012-05-15 ENCOUNTER — Telehealth: Payer: Self-pay | Admitting: *Deleted

## 2012-05-15 NOTE — Telephone Encounter (Signed)
Notified Lilyan Punt, pre- cert coordinator,  That PET scan has to be r/s due to not being approved yet.  Spoke w/ wife and informed we may need to delay office visit w/ Dr. Gaylyn Rong on 2/19 but will wait until we find out when the PET scan can be done.  Wife voiced frustration,  Does not understand why the test has not been approved yet.  Informed her this RN does not know the whys but referred her to call Lilyan Punt who may be able to shed some light on the situation for her.  Apologized for the inconvenience.  We agreed to wait to see when the PET can be approved and done and then move Dr. Lodema Pilot visit as needed.

## 2012-05-15 NOTE — Progress Notes (Unsigned)
05/15/2012  I spoke at great length with this patient's wife to explain how [place] [employer] works when it comes to approving a Immunologist.  I will contact patient on Monday if a decision has been made in the request.  Mrs. Guevarra voiced uncdrstanding.   Bonita Quin [phone]

## 2012-05-16 ENCOUNTER — Other Ambulatory Visit: Payer: Self-pay

## 2012-05-18 ENCOUNTER — Encounter (HOSPITAL_COMMUNITY): Payer: PRIVATE HEALTH INSURANCE

## 2012-05-20 ENCOUNTER — Other Ambulatory Visit: Payer: PRIVATE HEALTH INSURANCE | Admitting: Lab

## 2012-05-20 ENCOUNTER — Ambulatory Visit: Payer: PRIVATE HEALTH INSURANCE | Admitting: Nutrition

## 2012-05-20 ENCOUNTER — Ambulatory Visit: Payer: Self-pay | Admitting: Oncology

## 2012-05-20 ENCOUNTER — Other Ambulatory Visit: Payer: Self-pay

## 2012-05-20 DIAGNOSIS — C099 Malignant neoplasm of tonsil, unspecified: Secondary | ICD-10-CM

## 2012-05-20 DIAGNOSIS — I82409 Acute embolism and thrombosis of unspecified deep veins of unspecified lower extremity: Secondary | ICD-10-CM

## 2012-05-20 LAB — PROTIME-INR: INR: 3 (ref 2.00–3.50)

## 2012-05-20 MED ORDER — WARFARIN SODIUM 5 MG PO TABS: 5.0000 mg | ORAL_TABLET | Freq: Every day | ORAL | Status: DC

## 2012-05-20 NOTE — Progress Notes (Signed)
Patient and wife present to nutrition followup. He reports his taste is improving. He continues to have dry mouth. He no longer uses his feeding tube for nutrition support and is maintaining his weight on oral nutrition exclusively. Weight was documented as 154 pounds on February 20 which is stable from 155 pounds December 30.  Dietary recall reveals patient eating variety of foods. Patient no longer requiring ensure or boost oral nutrition supplements.  Nutrition diagnosis: Food and nutrition related knowledge deficit resolved. Diagnosis of suboptimal enteral nutrition infusion resolved.  Patient was encouraged to continue to eat frequent small meals and snacks throughout the day as needed to promote weight maintenance. He was educated to continue adding variety to his diet. Patient able to maintain weight on food intake alone so with M.D. approval agree feeding tube can be removed.  Patient was given my contact information again and encouraged to contact me if he has any further questions or problems.

## 2012-05-21 NOTE — Progress Notes (Signed)
Visit delayed due to delayed PET scan.

## 2012-05-22 ENCOUNTER — Telehealth: Payer: Self-pay | Admitting: Oncology

## 2012-05-26 ENCOUNTER — Encounter (HOSPITAL_COMMUNITY)
Admission: RE | Admit: 2012-05-26 | Discharge: 2012-05-26 | Disposition: A | Discharge status: Home / Self Care | Payer: PRIVATE HEALTH INSURANCE | Source: Ambulatory Visit | Attending: Oncology | Admitting: Oncology

## 2012-05-26 DIAGNOSIS — I517 Cardiomegaly: Secondary | ICD-10-CM | POA: Insufficient documentation

## 2012-05-26 DIAGNOSIS — I82409 Acute embolism and thrombosis of unspecified deep veins of unspecified lower extremity: Secondary | ICD-10-CM

## 2012-05-26 DIAGNOSIS — I2510 Atherosclerotic heart disease of native coronary artery without angina pectoris: Secondary | ICD-10-CM | POA: Insufficient documentation

## 2012-05-26 DIAGNOSIS — I723 Aneurysm of iliac artery: Secondary | ICD-10-CM | POA: Insufficient documentation

## 2012-05-26 DIAGNOSIS — J984 Other disorders of lung: Secondary | ICD-10-CM | POA: Insufficient documentation

## 2012-05-26 DIAGNOSIS — C099 Malignant neoplasm of tonsil, unspecified: Secondary | ICD-10-CM | POA: Insufficient documentation

## 2012-05-26 DIAGNOSIS — Z931 Gastrostomy status: Secondary | ICD-10-CM | POA: Insufficient documentation

## 2012-05-26 DIAGNOSIS — L723 Sebaceous cyst: Secondary | ICD-10-CM | POA: Insufficient documentation

## 2012-05-26 LAB — GLUCOSE, CAPILLARY: Glucose-Capillary: 89 mg/dL (ref 70–99)

## 2012-05-26 MED ORDER — FLUDEOXYGLUCOSE F - 18 (FDG) INJECTION
18.6000 | Freq: Once | INTRAVENOUS | Status: AC | PRN
  Administered 2012-05-26: 18.6 via INTRAVENOUS

## 2012-05-27 ENCOUNTER — Ambulatory Visit (HOSPITAL_BASED_OUTPATIENT_CLINIC_OR_DEPARTMENT_OTHER): Payer: PRIVATE HEALTH INSURANCE | Admitting: Oncology

## 2012-05-27 ENCOUNTER — Other Ambulatory Visit (HOSPITAL_BASED_OUTPATIENT_CLINIC_OR_DEPARTMENT_OTHER): Payer: PRIVATE HEALTH INSURANCE | Admitting: Lab

## 2012-05-27 ENCOUNTER — Telehealth: Payer: Self-pay | Admitting: Oncology

## 2012-05-27 VITALS — BP 133/90 | HR 64 | Temp 97.8°F | Resp 20 | Ht 67.0 in | Wt 150.3 lb

## 2012-05-27 DIAGNOSIS — I890 Lymphedema, not elsewhere classified: Secondary | ICD-10-CM

## 2012-05-27 DIAGNOSIS — Z85819 Personal history of malignant neoplasm of unspecified site of lip, oral cavity, and pharynx: Secondary | ICD-10-CM

## 2012-05-27 DIAGNOSIS — C119 Malignant neoplasm of nasopharynx, unspecified: Secondary | ICD-10-CM

## 2012-05-27 DIAGNOSIS — K117 Disturbances of salivary secretion: Secondary | ICD-10-CM

## 2012-05-27 DIAGNOSIS — Z86718 Personal history of other venous thrombosis and embolism: Secondary | ICD-10-CM

## 2012-05-27 DIAGNOSIS — I82409 Acute embolism and thrombosis of unspecified deep veins of unspecified lower extremity: Secondary | ICD-10-CM

## 2012-05-27 LAB — CBC WITH DIFFERENTIAL/PLATELET
Eosinophils Absolute: 0.1 10*3/uL (ref 0.0–0.5)
HCT: 40.7 % (ref 38.4–49.9)
HGB: 14.2 g/dL (ref 13.0–17.1)
LYMPH%: 12.2 % — ABNORMAL LOW (ref 14.0–49.0)
MONO#: 0.6 10*3/uL (ref 0.1–0.9)
NEUT#: 4.9 10*3/uL (ref 1.5–6.5)
NEUT%: 76 % — ABNORMAL HIGH (ref 39.0–75.0)
Platelets: 289 10*3/uL (ref 140–400)
WBC: 6.5 10*3/uL (ref 4.0–10.3)

## 2012-05-27 LAB — PROTIME-INR: Protime: 49.2 Seconds — ABNORMAL HIGH (ref 10.6–13.4)

## 2012-05-27 LAB — COMPREHENSIVE METABOLIC PANEL (CC13)
ALT: 9 U/L (ref 0–55)
Albumin: 3.5 g/dL (ref 3.5–5.0)
BUN: 13.8 mg/dL (ref 7.0–26.0)
CO2: 29 mEq/L (ref 22–29)
Calcium: 10 mg/dL (ref 8.4–10.4)
Chloride: 102 mEq/L (ref 98–107)
Creatinine: 0.8 mg/dL (ref 0.7–1.3)

## 2012-05-27 NOTE — Patient Instructions (Addendum)
1.  History of head/neck cancer.  No evidence of disease at this time. 2.  History of DVT:  Stop Coumadin at this time since you've had more than 6 months of Coumadin. 3.  Follow up:  In about 3 months with exam and repeat scan in about 6 months.

## 2012-05-27 NOTE — Progress Notes (Signed)
[facility]  [phone] [phone]   OFFICE PROGRESS NOTE   Cc:  [name], MD  DIAGNOSIS: Recurrent T2 N2b M0 tonsillar cancer.   PAST THERAPY: He underwent in 2011 robotic resection with Dr. Hezzie Bump at [facility] in addition to left neck dissection. He was recommended to receive adjuvant radiation therapy. However, he declined at that time. He developed recurrent disease in May 2013. He received 2 cycles of induction chemo Taxotere, Cisplatin, and 5-FU in June and July 2013. He had very good partial response but grade 3 toxicity with anorexia, weight loss.   He was on concurrent chemoradiation therapy with daily radiation and weekly Carboplatin and Taxol, between 12/16/11 and 01/27/12.  He has been in remission.   CURRENT THERAPY:  Watchful observation.   INTERVAL HISTORY: Shane Marsh 62 y.o. male returns for regular follow up with his wife.  He reported feeling well.  His strength is much improved now compared to 3 months ago.  He is able to be active with light chores around the house.  He is even driving now.  He wants to go back to work soon. He has limited vision at extreme peripheral visions.  He has good appetite and has been eating regular foods.  He still has xerostomia.  He has lymphedema in the submental area.  He denied any swelling or pain in the legs or arms.  He has no bleeding symptoms being on Coumadin.   Patient denies fever, anorexia, weight loss, fatigue, headache, dizziness, syncope, visual changes, confusion, drenching night sweats, palpable lymph node swelling, mucositis, odynophagia, dysphagia, nausea vomiting, jaundice, chest pain, palpitation, shortness of breath, dyspnea on exertion, productive cough, gum bleeding, epistaxis, hematemesis, hemoptysis, abdominal pain, abdominal swelling, early satiety, melena, hematochezia, hematuria, skin rash, spontaneous bleeding, joint swelling, joint pain, heat or cold intolerance, bowel  bladder incontinence, back pain, focal motor weakness, paresthesia, depression.      Past Medical History  Diagnosis Date  . Hypertension   . 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 GERD (gastroesophageal reflux disease) 10/15/2011  . Protein calorie malnutrition 10/15/2011  . Status post chemotherapy 10/04/11-10/13/11    taxotere/cisplatin/5FU  . Feeding by G-tube   . S/P radiation therapy 10/13/11 - 10/04/11  . Hx of sinus bradycardia   . DVT, bilateral lower limbs 10/29/11    Hx of  . Depression   . Tonsil cancer 12/04/2011    S/P XRT 12/16/11 thru 01/30/12    Past Surgical History  Procedure Laterality Date  . Splenectomy    . Tonsillectomy      one side    Current Outpatient Prescriptions  Medication Sig Dispense Refill  . Ensure Plus (ENSURE PLUS) LIQD Begin Ensure Plus 1/2 can via PEG QID with 120 ml free water before and after each bolus feeding.  If tolerated increase to 1 can of Ensure Plus QID on day 3 and 4.  If tolerated, increase to goal of 1.5 cans QID with 120 ml free water before and after each bolus.  In addition, pt is to drink or put an additional 240 ml free water in PEG daily.  Please send supplies and RN to home no later than Saturday, July 20th.  1422 mL  0  . escitalopram (LEXAPRO) 5 MG tablet Take 1 tablet (5 mg total) by mouth every morning.  90 tablet  2  . ferrous sulfate 325 (65 FE) MG tablet Take 325 mg by mouth  3 (three) times daily with meals.       . fludrocortisone (FLORINEF) 0.1 MG tablet Take 1 tablet (0.1 mg total) by mouth daily.  90 tablet  2  . midodrine (PROAMATINE) 10 MG tablet Take 5 mg by mouth 3 (three) times daily.      . ondansetron (ZOFRAN) 8 MG tablet Take 1 tablet (8 mg total) by mouth 2 (two) times daily as needed (Nausea or vomiting).  30 tablet  1  . prochlorperazine (COMPAZINE) 10 MG tablet Take 1 tablet (10 mg total) by mouth every 6 (six) hours as needed (Nausea or vomiting).  45 tablet  1  . sodium fluoride  (PREVIDENT) 1.1 % GEL dental gel Place  small amount of gel on toothbrush. Brush for 2 minutes at bedtime. Spit out excess. Do not rinse afterwards.  120 mL  prn  . warfarin (COUMADIN) 5 MG tablet Take 1 tablet (5 mg total) by mouth daily. Take as directed  30 tablet  0   No current facility-administered medications for this visit.    ALLERGIES:  is allergic to morphine and related.  REVIEW OF SYSTEMS:  The rest of the 14-point review of system was negative.   Filed Vitals:   05/27/12 0836  BP: 133/90  Pulse: 64  Temp: 97.8 F (36.6 C)  Resp: 20   Wt Readings from Last 3 Encounters:  05/27/12 150 lb 4.8 oz (68.176 kg)  05/20/12 154 lb (69.854 kg)  03/30/12 155 lb (70.308 kg)   ECOG Performance status: 0-1  PHYSICAL EXAMINATION:   General: thin-appearing man, in no acute distress.  Eyes:  no scleral icterus.  ENT:  He could not open his mouth very wide due to recent treatment.  I did not visualize oral thrush or ulcer.  Neck was without thyromegaly.  Lymphatics:  Negative cervical, supraclavicular or axillary adenopathy.  Respiratory: lungs were clear bilaterally without wheezing or crackles.  Cardiovascular:  Regular rate and rhythm, S1/S2, without murmur, rub or gallop.  There was no pedal edema.  GI:  abdomen was soft, flat, nontender, nondistended, without organomegaly.  PEG tube dry, clean, intact.  Muscoloskeletal:  no spinal tenderness of palpation of vertebral spine.  Skin exam was without echymosis, petichae.  Neuro exam showed inability to abduct his left eye at extreme periphery but this is better than before.  His uvula was midline.  His tongue slightly deviated to the left.  Patient was able to get on and off exam table without assistance.  Gait was normal.  Patient was alerted and oriented.  Attention was good.   Language was appropriate.  Mood was normal without depression.  Speech was not pressured.  Thought content was not tangential.     LABORATORY/RADIOLOGY  DATA:  Lab Results  Component Value Date   WBC 6.5 05/27/2012   HGB 14.2 05/27/2012   HCT 40.7 05/27/2012   PLT 289 05/27/2012   GLUCOSE 89 05/27/2012   CHOL 229* 08/29/2011   TRIG 143 08/29/2011   HDL 34* 08/29/2011   LDLCALC 166* 08/29/2011   ALKPHOS 52 05/27/2012   ALT 9 05/27/2012   AST 13 05/27/2012   NA 140 05/27/2012   K 3.9 05/27/2012   CL 102 05/27/2012   CREATININE 0.8 05/27/2012   BUN 13.8 05/27/2012   CO2 29 05/27/2012   INR 4.10* 05/27/2012   HGBA1C 5.9* 08/28/2011   IMAGING:    PET scan 05/26/2012.  IMPRESSION:  1. No evidence of hypermetabolic residual or recurrent head neck  primary. Low level hypermetabolism at the site of treated  parapharyngeal primary is again identified without well-defined  recurrent mass.  2. Developing radiation fibrosis at the apices, greater left than  right.  3. Similar left common iliac artery aneurysm.  4. Left tongue base hypermetabolism, favored to be physiologic.  This could be reevaluated at follow-up or directly visualized.    ASSESSMENT AND PLAN:    1. 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  - No evidence of residual or metastatic disease.  His cranial nerve deficit has significantly improved.  He no longer has syncope.  I referred him to IR to remove his portacath since he wants to go back to work in the near future.   2. Bilateral DVT:  He has had more than 6 months of anticoagulation.  I has no symptoms of DVT.  I recommended stopping Coumadin at this time.    3. Mild protein calorie malnutrition. Slight weight loss from exclusive oral intake.  He has not needed to use his PEG for a month.  He has been eating regular foods . I recommended referral to IR to remove his PEG tube.   4.  Lymphedema:  I referred him to Lymphedema clinic.   5.  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  I advised him to stop Florineff.    6. Depression. He is on Lexapro 5 mg daily with stable mood.   7.  Follow up: In about 3 months with [place].  I also advised him to follow up with ENT Dr. Sandi Carne and Rad Onc Dr. Basilio Cairo as well.  Given his high risk of recurrent/met disease, I recommended CT neck and chest in about 6 months (which need to be ordered next time he is here).

## 2012-05-27 NOTE — Telephone Encounter (Signed)
Gave pt appt for lab and ML on May 2014

## 2012-05-28 ENCOUNTER — Telehealth: Payer: Self-pay | Admitting: *Deleted

## 2012-05-28 NOTE — Telephone Encounter (Signed)
Informed [facility] Of coumadin d/c'd by Dr. Gaylyn Rong.

## 2012-05-28 NOTE — Telephone Encounter (Signed)
VM from [facility] in [place].  Pt being scheduled for PAC and PEG removal.  Is is ok w/ Dr. Gaylyn Rong to hold coumadin for 5 days prior to procedures?

## 2012-05-28 NOTE — Telephone Encounter (Signed)
I advised him to D/C Coumadin already yesterday.  He is done with Coumadin now.

## 2012-05-29 ENCOUNTER — Encounter: Payer: Self-pay | Admitting: *Deleted

## 2012-05-29 NOTE — Progress Notes (Signed)
Rec'd notification from [facility] that pt has declined treatment for submental edema.

## 2012-06-01 ENCOUNTER — Other Ambulatory Visit: Payer: Self-pay | Admitting: Radiology

## 2012-06-01 ENCOUNTER — Telehealth: Payer: Self-pay | Admitting: *Deleted

## 2012-06-01 NOTE — Telephone Encounter (Signed)
Pt requesting letter from Dr. 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  He works at [employer] and they need letter from MD releasing pt back to work.  Pt states feels able to work,  Has been working around the house, staying up during day w/o any naps.  He would like to pick up letter this Wed 3/05 since he will be at [facility] to have PEG tube removed that day.

## 2012-06-01 NOTE — Telephone Encounter (Signed)
Please tell patient that I will write the letter now.  However, it will only be part time for at least a month.  If he does well, then I will write another letter to release him for full time. Thanks.

## 2012-06-02 ENCOUNTER — Encounter (HOSPITAL_COMMUNITY): Payer: Self-pay | Admitting: Pharmacy Technician

## 2012-06-03 ENCOUNTER — Ambulatory Visit (HOSPITAL_COMMUNITY)
Admission: RE | Admit: 2012-06-03 | Discharge: 2012-06-03 | Disposition: A | Discharge status: Home / Self Care | Payer: PRIVATE HEALTH INSURANCE | Source: Ambulatory Visit | Attending: Oncology | Admitting: Oncology

## 2012-06-03 ENCOUNTER — Other Ambulatory Visit: Payer: Self-pay | Admitting: Oncology

## 2012-06-03 ENCOUNTER — Encounter (HOSPITAL_COMMUNITY): Payer: Self-pay

## 2012-06-03 VITALS — BP 113/78 | HR 79 | Temp 97.9°F | Resp 20 | Ht 67.0 in | Wt 150.0 lb

## 2012-06-03 DIAGNOSIS — Z79899 Other long term (current) drug therapy: Secondary | ICD-10-CM | POA: Insufficient documentation

## 2012-06-03 DIAGNOSIS — Z452 Encounter for adjustment and management of vascular access device: Secondary | ICD-10-CM | POA: Insufficient documentation

## 2012-06-03 DIAGNOSIS — Z86718 Personal history of other venous thrombosis and embolism: Secondary | ICD-10-CM | POA: Insufficient documentation

## 2012-06-03 DIAGNOSIS — K219 Gastro-esophageal reflux disease without esophagitis: Secondary | ICD-10-CM | POA: Insufficient documentation

## 2012-06-03 DIAGNOSIS — C119 Malignant neoplasm of nasopharynx, unspecified: Secondary | ICD-10-CM

## 2012-06-03 DIAGNOSIS — E46 Unspecified protein-calorie malnutrition: Secondary | ICD-10-CM | POA: Insufficient documentation

## 2012-06-03 DIAGNOSIS — I10 Essential (primary) hypertension: Secondary | ICD-10-CM | POA: Insufficient documentation

## 2012-06-03 LAB — PROTIME-INR: Prothrombin Time: 12.7 seconds (ref 11.6–15.2)

## 2012-06-03 LAB — CBC WITH DIFFERENTIAL/PLATELET
Eosinophils Absolute: 0.1 10*3/uL (ref 0.0–0.7)
Eosinophils Relative: 2 % (ref 0–5)
HCT: 39.3 % (ref 39.0–52.0)
Lymphs Abs: 1 10*3/uL (ref 0.7–4.0)
MCH: 32.3 pg (ref 26.0–34.0)
MCV: 94.7 fL (ref 78.0–100.0)
Monocytes Absolute: 0.6 10*3/uL (ref 0.1–1.0)
Platelets: 289 10*3/uL (ref 150–400)
RBC: 4.15 MIL/uL — ABNORMAL LOW (ref 4.22–5.81)
RDW: 13.5 % (ref 11.5–15.5)

## 2012-06-03 MED ORDER — FENTANYL CITRATE 0.05 MG/ML IJ SOLN
INTRAMUSCULAR | Status: AC
  Filled 2012-06-03: qty 6

## 2012-06-03 MED ORDER — FENTANYL CITRATE 0.05 MG/ML IJ SOLN
INTRAMUSCULAR | Status: AC | PRN
  Administered 2012-06-03: 50 ug via INTRAVENOUS
  Administered 2012-06-03: 100 ug via INTRAVENOUS
  Administered 2012-06-03: 50 ug via INTRAVENOUS

## 2012-06-03 MED ORDER — LIDOCAINE VISCOUS 2 % MT SOLN
5.0000 mL | Freq: Once | OROMUCOSAL | Status: AC
  Administered 2012-06-03: 5 mL via OROMUCOSAL
  Filled 2012-06-03: qty 5

## 2012-06-03 MED ORDER — SODIUM CHLORIDE 0.9 % IV SOLN
INTRAVENOUS | Status: DC
  Administered 2012-06-03: 08:00:00 via INTRAVENOUS

## 2012-06-03 MED ORDER — CEFAZOLIN SODIUM 1-5 GM-% IV SOLN
1.0000 g | Freq: Once | INTRAVENOUS | Status: AC
  Administered 2012-06-03: 1 g via INTRAVENOUS
  Filled 2012-06-03: qty 50

## 2012-06-03 MED ORDER — MIDAZOLAM HCL 2 MG/2ML IJ SOLN
INTRAMUSCULAR | Status: AC | PRN
  Administered 2012-06-03: 2 mg via INTRAVENOUS
  Administered 2012-06-03 (×2): 1 mg via INTRAVENOUS

## 2012-06-03 MED ORDER — MIDAZOLAM HCL 2 MG/2ML IJ SOLN
INTRAMUSCULAR | Status: AC
  Filled 2012-06-03: qty 6

## 2012-06-03 NOTE — H&P (Signed)
Lamarkus Nebel is an 62 y.o. male.   Chief Complaint: "I'm getting my port and stomach tube out" HPI: Patient with history of tonsillar/nasopharyngeal carcinoma, currently with no evidence of residual disease, as well as prior gastrostomy tube for feeds. He is eating solid food at present and request is now made for both port a cath and gastrostomy tube removal .  Past Medical History  Diagnosis Date  . Hypertension   . 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 GERD (gastroesophageal reflux disease) 10/15/2011  . Protein calorie malnutrition 10/15/2011  . Status post chemotherapy 10/04/11-10/13/11    taxotere/cisplatin/5FU  . Feeding by G-tube   . S/P radiation therapy 10/13/11 - 10/04/11  . Hx of sinus bradycardia   . DVT, bilateral lower limbs 10/29/11    Hx of  . Depression   . Tonsil cancer 12/04/2011    S/P XRT 12/16/11 thru 01/30/12    Past Surgical History  Procedure Laterality Date  . Splenectomy    . Tonsillectomy      one side  . Port placemet  7/16  . Gastrostomy tube placement  8/13    Family History  Problem Relation Age of Onset  . Cancer Paternal Uncle     nasopharynx   Social History:  reports that he has never smoked. He has never used smokeless tobacco. He reports that he does not drink alcohol or use illicit drugs.  Allergies:  Allergies  Allergen Reactions  . Morphine And Related Nausea And Vomiting    Current outpatient prescriptions:ferrous sulfate 325 (65 FE) MG tablet, Take 325 mg by mouth 3 (three) times daily with meals. , Disp: , Rfl: ;  fludrocortisone (FLORINEF) 0.1 MG tablet, Take 1 tablet (0.1 mg total) by mouth daily., Disp: 90 tablet, Rfl: 2;  midodrine (PROAMATINE) 10 MG tablet, Take 5 mg by mouth 3 (three) times daily., Disp: , Rfl:  sodium fluoride (PREVIDENT) 1.1 % GEL dental gel, Place  small amount of gel on toothbrush. Brush for 2 minutes at bedtime. Spit out excess. Do not rinse afterwards., Disp: 120 mL, Rfl: prn;  escitalopram  (LEXAPRO) 5 MG tablet, Take 1 tablet (5 mg total) by mouth every morning., Disp: 90 tablet, Rfl: 2 Current facility-administered medications:0.9 %  sodium chloride infusion, , Intravenous, Continuous, Brayton El, PA-C, Last Rate: 20 mL/hr at 06/03/12 0749;  ceFAZolin (ANCEF) IVPB 1 g/50 mL premix, 1 g, Intravenous, Once, Brayton El, PA-C   Results for orders placed during the hospital encounter of 06/03/12 (from the past 48 hour(s))  APTT     Status: None   Collection Time    06/03/12  7:40 AM      Result Value Range   aPTT 31  24 - 37 seconds  CBC WITH DIFFERENTIAL     Status: Abnormal   Collection Time    06/03/12  7:40 AM      Result Value Range   WBC 5.7  4.0 - 10.5 K/uL   RBC 4.15 (*) 4.22 - 5.81 MIL/uL   Hemoglobin 13.4  13.0 - 17.0 g/dL   HCT 16.1  09.6 - 04.5 %   MCV 94.7  78.0 - 100.0 fL   MCH 32.3  26.0 - 34.0 pg   MCHC 34.1  30.0 - 36.0 g/dL   RDW 40.9  81.1 - 91.4 %   Platelets 289  150 - 400 K/uL   Neutrophils Relative 68  43 - 77 %   Neutro Abs 3.9  1.7 - 7.7 K/uL   Lymphocytes Relative 18  12 - 46 %   Lymphs Abs 1.0  0.7 - 4.0 K/uL   Monocytes Relative 11  3 - 12 %   Monocytes Absolute 0.6  0.1 - 1.0 K/uL   Eosinophils Relative 2  0 - 5 %   Eosinophils Absolute 0.1  0.0 - 0.7 K/uL   Basophils Relative 1  0 - 1 %   Basophils Absolute 0.1  0.0 - 0.1 K/uL  PROTIME-INR     Status: None   Collection Time    06/03/12  7:40 AM      Result Value Range   Prothrombin Time 12.7  11.6 - 15.2 seconds   INR 0.96  0.00 - 1.49   No results found.  Review of Systems  Constitutional: Negative for fever and chills.  Respiratory: Negative for cough and shortness of breath.   Cardiovascular: Negative for chest pain.  Gastrointestinal: Negative for nausea, vomiting and abdominal pain.  Musculoskeletal: Negative for back pain.  Neurological: Negative for headaches.  Endo/Heme/Allergies: Does not bruise/bleed easily.   Vitals: BP 122/81  HR 69  R 20  TEMP 98  O2 SATS  98%RA  Height 5\' 7"  (1.702 m), weight 150 lb (68.04 kg). Physical Exam  Constitutional: He is oriented to person, place, and time. He appears well-developed and well-nourished.  Cardiovascular: Normal rate and regular rhythm.   Respiratory: Effort normal and breath sounds normal.  Clean, intact rt IJ PAC  GI: Soft. Bowel sounds are normal. There is no tenderness.  Clean, intact gastrostomy tube  Musculoskeletal: Normal range of motion. He exhibits no edema.  Neurological: He is alert and oriented to person, place, and time.     Assessment/Plan Pt with hx tonsillar/nasopharyngeal carcinoma, currently with no evidence of residual disease . Also with prior gastrostomy tube placement for feeds and currently eating solid food without difficulty. Plan is for both port a cath and gastrostomy tube removals today. Details/risks of procedures d/w pt/wife with their understanding and consent.  ALLRED,D KEVIN 06/03/2012, 9:06 AM

## 2012-06-03 NOTE — Procedures (Signed)
Successful removal of right anterior chest wall port-a-cath. Successful removal of gastrostomy tube. No immediate post procedural complications.

## 2012-06-04 ENCOUNTER — Telehealth: Payer: Self-pay | Admitting: *Deleted

## 2012-06-04 NOTE — Telephone Encounter (Signed)
Pt called to check on status of letter he requested from Dr. Gaylyn Rong to return to work.  Informed pt letter ready to pick up and left at front desk in file folder.  He verbalized understanding.

## 2012-06-05 ENCOUNTER — Ambulatory Visit: Payer: PRIVATE HEALTH INSURANCE | Admitting: Radiation Oncology

## 2012-06-19 ENCOUNTER — Ambulatory Visit: Payer: PRIVATE HEALTH INSURANCE | Admitting: Radiation Oncology

## 2012-07-09 ENCOUNTER — Encounter: Payer: Self-pay | Admitting: Radiation Oncology

## 2012-07-10 ENCOUNTER — Ambulatory Visit
Admission: RE | Admit: 2012-07-10 | Discharge: 2012-07-10 | Disposition: A | Discharge status: Home / Self Care | Payer: PRIVATE HEALTH INSURANCE | Source: Ambulatory Visit | Attending: Radiation Oncology | Admitting: Radiation Oncology

## 2012-07-10 ENCOUNTER — Encounter: Payer: Self-pay | Admitting: Oncology

## 2012-07-10 VITALS — BP 109/74 | HR 71 | Temp 98.3°F | Wt 148.7 lb

## 2012-07-10 DIAGNOSIS — C099 Malignant neoplasm of tonsil, unspecified: Secondary | ICD-10-CM

## 2012-07-10 DIAGNOSIS — C119 Malignant neoplasm of nasopharynx, unspecified: Secondary | ICD-10-CM

## 2012-07-10 HISTORY — DX: Reserved for inherently not codable concepts without codable children: IMO0001

## 2012-07-10 HISTORY — DX: Reserved for concepts with insufficient information to code with codable children: IMO0002

## 2012-07-10 HISTORY — DX: Lymphedema, not elsewhere classified: I89.0

## 2012-07-10 NOTE — Progress Notes (Signed)
Radiation Oncology         [phone] ________________________________  Name: Shane Marsh MRN: [id number]  Date: 07/10/2012  DOB: 09-01-1950  Follow-Up Visit Note  CC: [name], MD  Exie Parody, MD  Corey Skains MD  Diagnosis:   Recurrent T2 N2b. M0 left tonsil squamous cell carcinoma  Interval Since Last Radiation: He completed 66 Gray in 33 fractions to the nasopharynx oropharynx and bilateral neck on 01/30/2012. He had a prior course of 4 Gray in 2 fractions before induction chemotherapy   Narrative:  The patient returns today for routine follow-up.  He is starting to work full time. He is doing quite well. He had a PET scan performed on    05/26/2012 which showed     IMPRESSION:  1. No evidence of hypermetabolic residual or recurrent head neck primary. Low level hypermetabolism at the site of treated parapharyngeal primary is again identified without well-defined recurrent mass.  2. Developing radiation fibrosis at the apices, greater left than right.  3. Similar left common iliac artery aneurysm.  4. Left tongue base hypermetabolism, favored to be physiologic.  This could be reevaluated at follow-up or directly visualized.   His voice is a nasal quality which the patient feels is his new baseline. This is not changing. His energy is good. Thyroid function was tested in medical oncology in February -normal.      He denies any sore throat, but reports that he has to drink more liquids to bolus his food when eating and that he continues to have dry mouth, but it is better than when he completed radiation therapy. He started working on March 17 th and this week is his 1st full week since. He states he feels good today and he notes that he does not sleep as much as he did post treatment.  His skin is clear and the swelling of his neck has reduced in size. He massages this area daily, deferred physical therapy.   He reports he is using his fluoride trays and following up with  dentistry. He denies smoking or drinking alcohol      ALLERGIES:  is allergic to morphine and related.  Meds: Current Outpatient Prescriptions  Medication Sig Dispense Refill  . sodium fluoride (PREVIDENT) 1.1 % GEL dental gel Place  small amount of gel on toothbrush. Brush for 2 minutes at bedtime. Spit out excess. Do not rinse afterwards.  120 mL  prn   No current facility-administered medications for this encounter.    Physical Findings: The patient is in no acute distress. Patient is alert and oriented.  weight is 148 lb 11.2 oz (67.45 kg). His temperature is 98.3 F (36.8 C). His blood pressure is 109/74 and his pulse is 71. . Sitting comfortably in a chair in no acute distress. Nasal quality to his voice. No ptosis. Pupillary reflexes intact bilaterally. His extraocular movements are intact. His tongue deviates to the left. His palate elevates less on the left side. He has modest subcutaneous edema in his anterior neck. He has no palpable lymphadenopathy through the cervical or supraclavicular regions. No visible lesions in the oropharynx. Saliva is slightly thickened  Lab Findings: Lab Results  Component Value Date   WBC 5.7 06/03/2012   HGB 13.4 06/03/2012   HCT 39.3 06/03/2012   MCV 94.7 06/03/2012   PLT 289 06/03/2012    CMP     Component Value Date/Time   NA 140 05/27/2012 0815   NA 138 11/06/2011 1020  K 3.9 05/27/2012 0815   K 3.8 11/06/2011 1020   CL 102 05/27/2012 0815   CL 103 11/06/2011 1020   CO2 29 05/27/2012 0815   CO2 26 11/06/2011 1020   GLUCOSE 89 05/27/2012 0815   GLUCOSE 85 11/06/2011 1020   BUN 13.8 05/27/2012 0815   BUN 15 11/06/2011 1020   CREATININE 0.8 05/27/2012 0815   CREATININE 0.49* 11/06/2011 1020   CALCIUM 10.0 05/27/2012 0815   CALCIUM 8.7 11/06/2011 1020   PROT 6.9 05/27/2012 0815   PROT 4.9* 11/06/2011 1020   ALBUMIN 3.5 05/27/2012 0815   ALBUMIN 3.0* 11/06/2011 1020   AST 13 05/27/2012 0815   AST 17 11/06/2011 1020   ALT 9 05/27/2012 0815   ALT 40 11/06/2011 1020    ALKPHOS 52 05/27/2012 0815   ALKPHOS 70 11/06/2011 1020   BILITOT 0.43 05/27/2012 0815   BILITOT 0.3 11/06/2011 1020   GFRNONAA >90 09/20/2011 0443   GFRAA >90 09/20/2011 0443   Lab Results  Component Value Date   TSH 2.900 05/27/2012     Radiographic Findings: No results found.  Impression/Plan:    1) Head and Neck Cancer Status: No evidence of recurrence  2) Nutritional Status: No issues to address this time  3) Risk Factors: The patient has been educated about risk factors including alcohol and tobacco abuse; they understand that avoidance of alcohol and tobacco is important to prevent recurrences as well as other cancers  4) Swallowing: Good, sometimes bolusing with water  5) Dental: Encouraged to continue regular followup with dentistry, and dental hygiene including fluoride rinses.   6) Energy: Good, normal TSH in February  7) Social: No active social issues to address at this time  66) Follow-up in the end of August. The patient was encouraged to call with any issues or questions before then.  I spent 20 minutes face to face with the patient and more than 50% of that time was spent in counseling and/or coordination of care. _____________________________________   Lonie Peak, MD

## 2012-07-10 NOTE — Progress Notes (Signed)
[name] here for fu S/P radiation to the left tonsilllar region.   He denies any sore throat, but reports that he has to drink more liquids to bolus his food when eating and that he continues to have dry mouth, ut it is better than when he completed radiation therapy.  He started working on March 17 th and this week is his 1st full week since.  He states he feels good today and he notes that he does not sleep as much as he did post treatment.  Upon inspection his mouth is clear with some thickened saliva.  He now has a beard and no longer has to shave  His neck.  His skin is clear and the swelling of his neck has reduced in size. He massages this area daily.

## 2012-07-13 ENCOUNTER — Encounter: Payer: Self-pay | Admitting: Radiation Oncology

## 2012-07-16 ENCOUNTER — Telehealth: Payer: Self-pay | Admitting: *Deleted

## 2012-07-16 NOTE — Telephone Encounter (Signed)
sw pt made her aware that on 5/26 we will be closed. gv appt for 08/25/12. i also agreed to mail her out a letter/cal as a reminder...td

## 2012-07-20 ENCOUNTER — Encounter: Payer: Self-pay | Admitting: Oncology

## 2012-07-20 ENCOUNTER — Other Ambulatory Visit: Payer: Self-pay | Admitting: Lab

## 2012-07-21 ENCOUNTER — Ambulatory Visit: Payer: Self-pay | Admitting: Oncology

## 2012-08-02 IMAGING — CR DG CHEST 1V PORT
1 series · 1 of 1 positions shown · non-contrast
Comparison: 08/28/2011 and PET of 09/04/2011.

CLINICAL DATA: Shortness of breath.  Recent diagnosis of nasal
pharyngeal carcinoma.

PORTABLE CHEST - 1 VIEW

[AP]
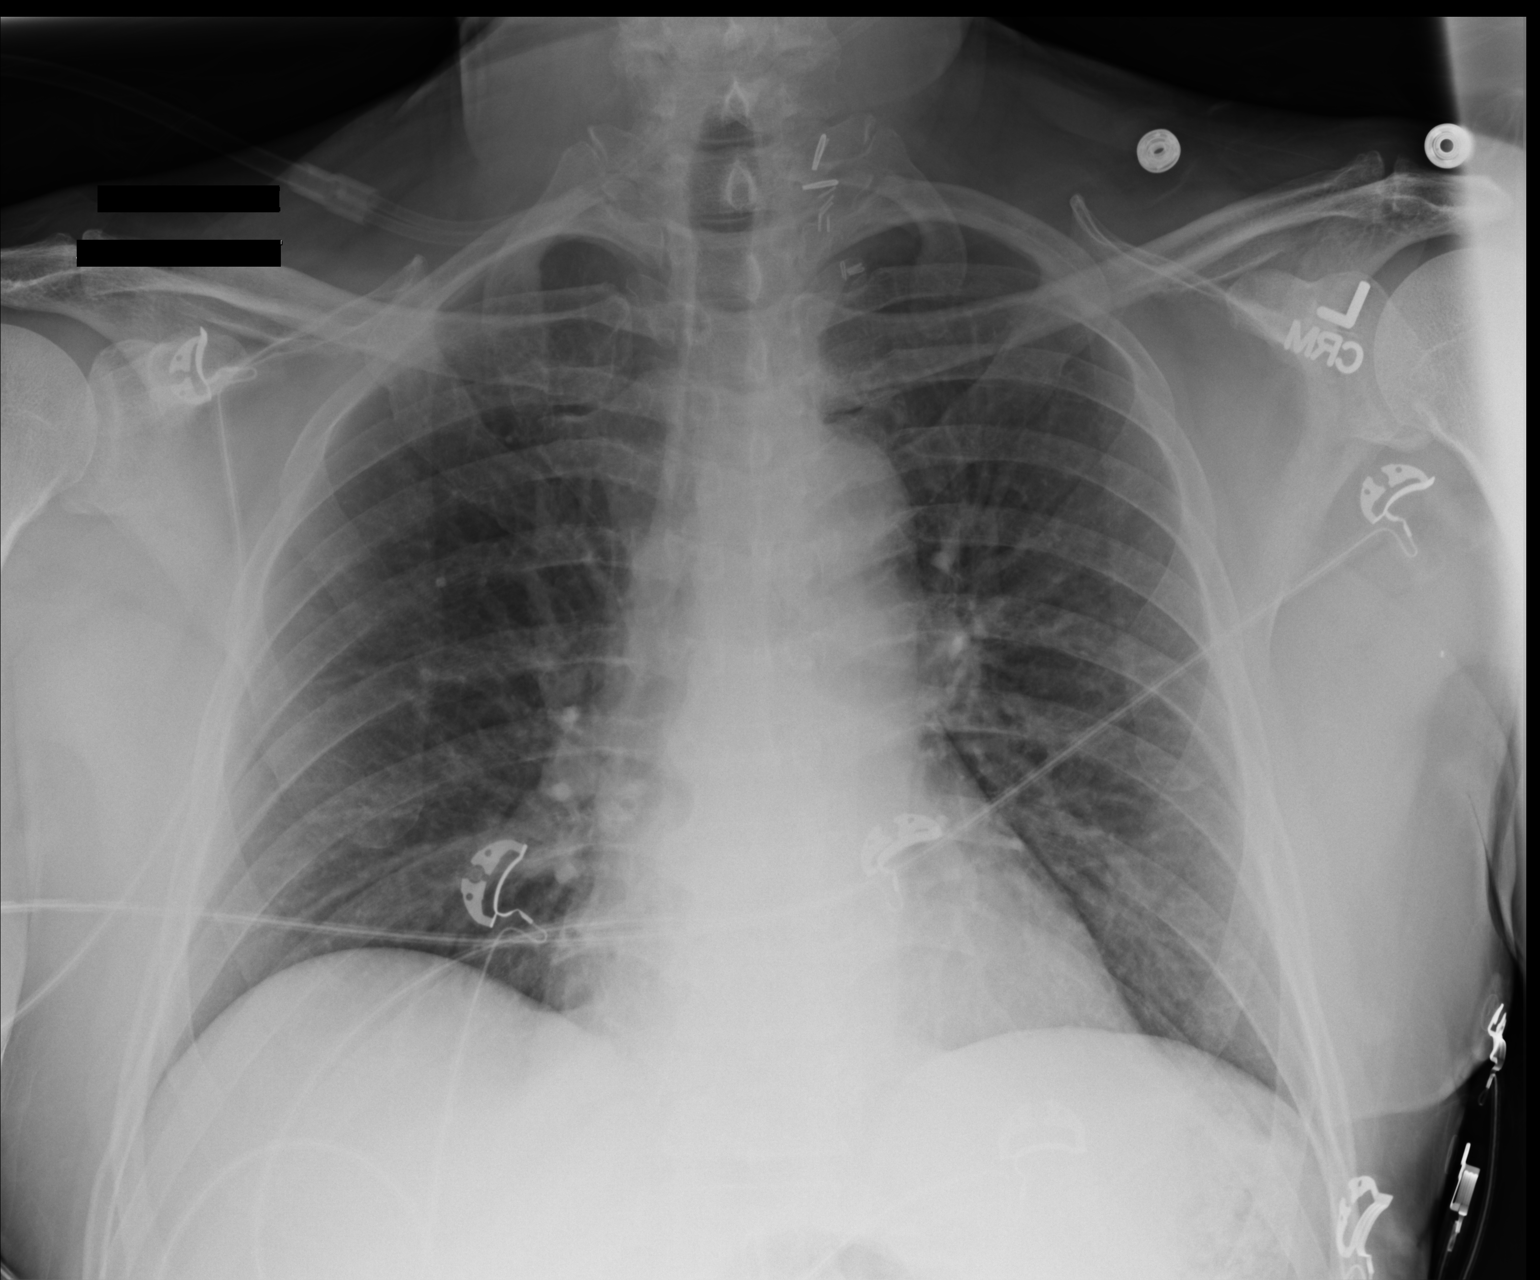

[1 of 1 positions shown; findings below may reference images not displayed]

FINDINGS: Surgical clips at the left side of the thoracic inlet.
Midline trachea.  Normal heart size.  No pleural effusion or
pneumothorax.  Clear lungs.
IMPRESSION: No acute cardiopulmonary disease.

## 2012-08-06 IMAGING — CR DG CHEST 1V PORT
1 series · 1 of 1 positions shown · non-contrast
Comparison: 09/11/2011; 09/10/18 [DATE]; chest CT - 09/11/2011

CLINICAL DATA: Evaluate pulmonary edema, history of nasal and
throat cancer

PORTABLE CHEST - 1 VIEW

[AP]
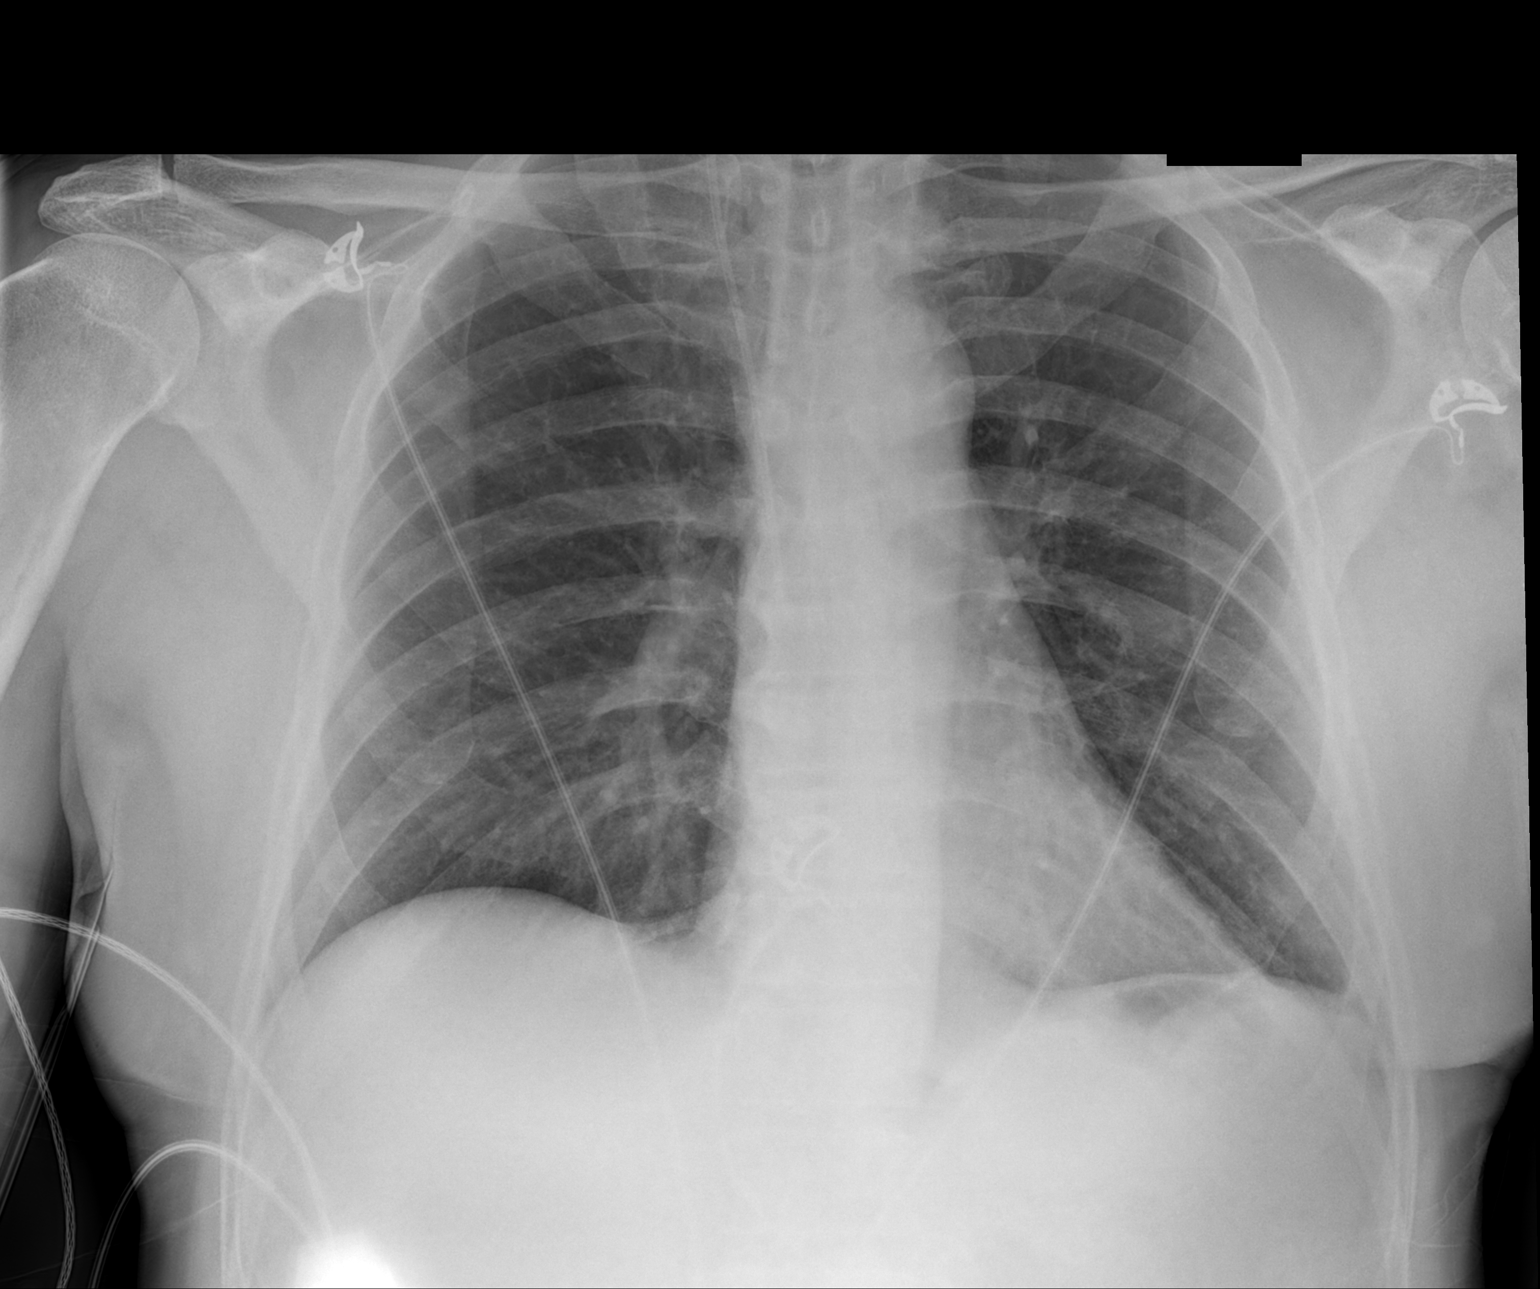

[1 of 1 positions shown; findings below may reference images not displayed]

FINDINGS: Grossly unchanged cardiac silhouette and mediastinal
contours.  Stable positioning of support apparatus.  Minimal
bibasilar opacities favored to represent atelectasis.  No definite
pleural effusion or pneumothorax.   Surgical clips overlie the left
supraclavicular fossa.  Unchanged bones.
IMPRESSION: Minimal bibasilar opacities favored to represent atelectasis.  No
definite evidence of pulmonary edema.

## 2012-08-24 ENCOUNTER — Other Ambulatory Visit: Payer: Self-pay | Admitting: Lab

## 2012-08-24 ENCOUNTER — Ambulatory Visit: Payer: Self-pay | Admitting: Oncology

## 2012-08-25 ENCOUNTER — Encounter: Payer: Self-pay | Admitting: Oncology

## 2012-08-25 ENCOUNTER — Ambulatory Visit (HOSPITAL_BASED_OUTPATIENT_CLINIC_OR_DEPARTMENT_OTHER): Payer: PRIVATE HEALTH INSURANCE | Admitting: Oncology

## 2012-08-25 ENCOUNTER — Telehealth: Payer: Self-pay | Admitting: Oncology

## 2012-08-25 ENCOUNTER — Other Ambulatory Visit (HOSPITAL_BASED_OUTPATIENT_CLINIC_OR_DEPARTMENT_OTHER): Payer: PRIVATE HEALTH INSURANCE | Admitting: Lab

## 2012-08-25 VITALS — BP 101/64 | HR 63 | Temp 97.6°F | Resp 18 | Ht 67.0 in | Wt 142.0 lb

## 2012-08-25 DIAGNOSIS — C099 Malignant neoplasm of tonsil, unspecified: Secondary | ICD-10-CM

## 2012-08-25 DIAGNOSIS — C119 Malignant neoplasm of nasopharynx, unspecified: Secondary | ICD-10-CM

## 2012-08-25 LAB — CBC WITH DIFFERENTIAL/PLATELET
Basophils Absolute: 0 10*3/uL (ref 0.0–0.1)
EOS%: 1.6 % (ref 0.0–7.0)
Eosinophils Absolute: 0.1 10*3/uL (ref 0.0–0.5)
HGB: 11.6 g/dL — ABNORMAL LOW (ref 13.0–17.1)
LYMPH%: 10.2 % — ABNORMAL LOW (ref 14.0–49.0)
MCH: 31.4 pg (ref 27.2–33.4)
MCV: 94.9 fL (ref 79.3–98.0)
MONO%: 6.9 % (ref 0.0–14.0)
NEUT#: 6.4 10*3/uL (ref 1.5–6.5)
NEUT%: 80.9 % — ABNORMAL HIGH (ref 39.0–75.0)
Platelets: 259 10*3/uL (ref 140–400)
RDW: 14.8 % — ABNORMAL HIGH (ref 11.0–14.6)

## 2012-08-25 LAB — COMPREHENSIVE METABOLIC PANEL (CC13)
AST: 14 U/L (ref 5–34)
Albumin: 3.3 g/dL — ABNORMAL LOW (ref 3.5–5.0)
Alkaline Phosphatase: 49 U/L (ref 40–150)
BUN: 11.8 mg/dL (ref 7.0–26.0)
Creatinine: 0.8 mg/dL (ref 0.7–1.3)
Glucose: 102 mg/dl — ABNORMAL HIGH (ref 70–99)
Potassium: 4.4 mEq/L (ref 3.5–5.1)
Total Bilirubin: 0.68 mg/dL (ref 0.20–1.20)

## 2012-08-25 NOTE — Progress Notes (Signed)
[facility]  Telephone:([phone] [phone]   OFFICE PROGRESS NOTE   Cc:  [name], MD  DIAGNOSIS: Recurrent T2 N2b M0 tonsillar cancer.   PAST THERAPY: He underwent in 2011 robotic resection with Dr. Hezzie Bump at [facility] in addition to left neck dissection. He was recommended to receive adjuvant radiation therapy. However, he declined at that time. He developed recurrent disease in May 2013. He received 2 cycles of induction chemo Taxotere, Cisplatin, and 5-FU in June and July 2013. He had very good partial response but grade 3 toxicity with anorexia, weight loss.   He was on concurrent chemoradiation therapy with daily radiation and weekly Carboplatin and Taxol, between 12/16/11 and 01/27/12.  He has been in remission.   CURRENT THERAPY:  Watchful observation.   INTERVAL HISTORY: Shane Marsh 62 y.o. male returns for regular follow up by himself.  He reported feeling well.  His strength is much improved now compared to 3 months ago.  He is working full-time now. He has good appetite and has been eating regular foods; however he is still losing weight.  He still has xerostomia.  The lymphedema in the submental area has resolved.  He denied any swelling or pain in the legs or arms.    Patient denies fever, anorexia, weight loss, fatigue, headache, dizziness, syncope, visual changes, confusion, drenching night sweats, palpable lymph node swelling, mucositis, odynophagia, dysphagia, nausea vomiting, jaundice, chest pain, palpitation, shortness of breath, dyspnea on exertion, productive cough, gum bleeding, epistaxis, hematemesis, hemoptysis, abdominal pain, abdominal swelling, early satiety, melena, hematochezia, hematuria, skin rash, spontaneous bleeding, joint swelling, joint pain, heat or cold intolerance, bowel bladder incontinence, back pain, focal motor weakness, paresthesia, depression.      Past Medical History  Diagnosis Date  . Hypertension   .  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 GERD (gastroesophageal reflux disease) 10/15/2011  . Protein calorie malnutrition 10/15/2011  . Status post chemotherapy 10/04/11-10/13/11    taxotere/cisplatin/5FU  . Feeding by G-tube   . S/P radiation therapy 10/13/11 - 10/04/11  . Hx of sinus bradycardia   . DVT, bilateral lower limbs 10/29/11    Hx of  . Depression   . Tonsil cancer 12/04/2011    S/P XRT 12/16/11 thru 01/30/12  . Status post chemotherapy 12/16/11 - 01/27/12    Carboplatin/ Taxol Concurretn with Radiation Therapy  . Lymphedema of face     Chin  . Depression Noted 05/27/12    lexapro  . Radiation 12/16/2011-01/30/2012    nasopharynx/oropharynx/bilateral neck 66 cGy 33 fractions    Past Surgical History  Procedure Laterality Date  . Splenectomy    . Tonsillectomy      one side  . Port placemet  7/16  . Gastrostomy tube placement  8/13  . Removal gastrotomy tube    . Port-a-cath removal       . Robotic resection  2011    Current Outpatient Prescriptions  Medication Sig Dispense Refill  . sodium fluoride (PREVIDENT) 1.1 % GEL dental gel Place  small amount of gel on toothbrush. Brush for 2 minutes at bedtime. Spit out excess. Do not rinse afterwards.  120 mL  prn   No current facility-administered medications for this visit.    ALLERGIES:  is allergic to morphine and related.  REVIEW OF SYSTEMS:  The rest of the 14-point review of system was negative.   Filed Vitals:   08/25/12 0837  BP: 101/64  Pulse:  63  Temp: 97.6 F (36.4 C)  Resp: 18   Wt Readings from Last 3 Encounters:  08/25/12 142 lb (64.411 kg)  07/10/12 148 lb 11.2 oz (67.45 kg)  06/03/12 150 lb (68.04 kg)   ECOG Performance status: 0-1  PHYSICAL EXAMINATION:   General: thin-appearing man, in no acute distress.  Eyes:  no scleral icterus.  ENT:  He could not open his mouth very wide due to recent treatment.  I did not visualize oral thrush or ulcer.  Neck was without thyromegaly.   Lymphatics:  Negative cervical, supraclavicular or axillary adenopathy.  Respiratory: lungs were clear bilaterally without wheezing or crackles.  Cardiovascular:  Regular rate and rhythm, S1/S2, without murmur, rub or gallop.  There was no pedal edema.  GI:  abdomen was soft, flat, nontender, nondistended, without organomegaly.  Muscoloskeletal:  no spinal tenderness of palpation of vertebral spine.  Skin exam was without echymosis, petichae.  Neuro exam showed inability to abduct his left eye at extreme periphery but this is better than before.  His uvula was midline.  His tongue slightly deviated to the left.  Patient was able to get on and off exam table without assistance.  Gait was normal.  Patient was alerted and oriented.  Attention was good.   Language was appropriate.  Mood was normal without depression.  Speech was not pressured.  Thought content was not tangential.     LABORATORY/RADIOLOGY DATA:  Lab Results  Component Value Date   WBC 7.9 08/25/2012   HGB 11.6* 08/25/2012   HCT 35.1* 08/25/2012   PLT 259 08/25/2012   GLUCOSE 89 05/27/2012   CHOL 229* 08/29/2011   TRIG 143 08/29/2011   HDL 34* 08/29/2011   LDLCALC 166* 08/29/2011   ALKPHOS 52 05/27/2012   ALT 9 05/27/2012   AST 13 05/27/2012   NA 140 05/27/2012   K 3.9 05/27/2012   CL 102 05/27/2012   CREATININE 0.8 05/27/2012   BUN 13.8 05/27/2012   CO2 29 05/27/2012   INR 0.96 06/03/2012   HGBA1C 5.9* 08/28/2011    ASSESSMENT AND PLAN:    1. 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  - No evidence of residual or metastatic disease.  His cranial nerve deficit has significantly improved.  He no longer has syncope.  - He will have a restaging CT of the neck and chest in approximately 3 months given his high risk of recurrent/metastatic disease.  2.  Bilateral DVT:  Status post 6 months of Coumadin. No evidence of recurrence.  3. Mild protein calorie malnutrition. Slight weight loss from exclusive oral intake.  His appetite is good. Recommend that he drink 2 cans of Ensure or boost per day.  4.  Lymphedema: Resolved. He will let us know if this recurs we will refer him back to the lymphedema clinic.  5. 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 She remains off Florinef.    6. Depression. He has stopped his Lexapro. Mood remains stable.   7.  Follow up: In about 3 months with [place].  I also advised him to follow up with ENT Dr. Sandi Carne and Rad Onc Dr. Basilio Cairo as well.

## 2012-08-25 NOTE — Telephone Encounter (Signed)
Pt wants CT after seeing Dr. Basilio Cairo @ 4pm,CT can be done @ 5pm on 8/22, labs drwn @ 3:30 pm, [facility] does not do CT @ 5pm so pt will have to schedule appt for ct once Dr. Basilio Cairo appt is out of the way

## 2012-09-09 IMAGING — XA IR PERC PLACEMENT GASTROSTOMY
1 series · 12 of 12 positions shown · non-contrast
Comparison: none

CLINICAL HISTORY: 10year-old with nasopharyngeal carcinoma and
unable to eat.

[Series 300: sp percut gastrostomy tube insert  w/flu · 12 of 12 slices shown]
[im 1/12]
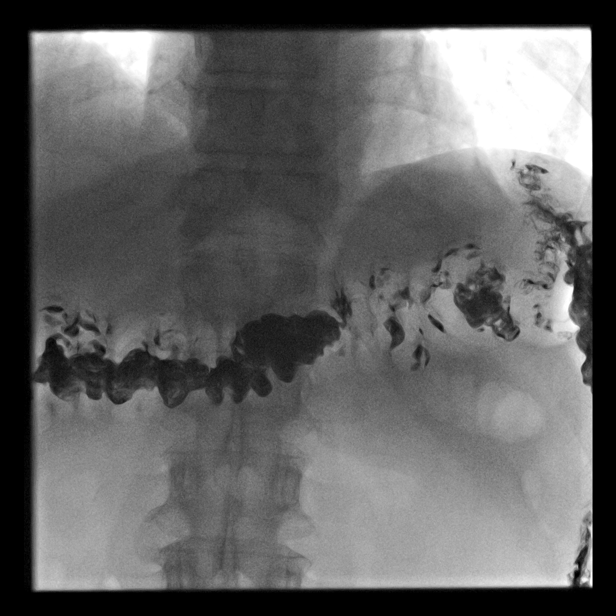
[im 2/12]
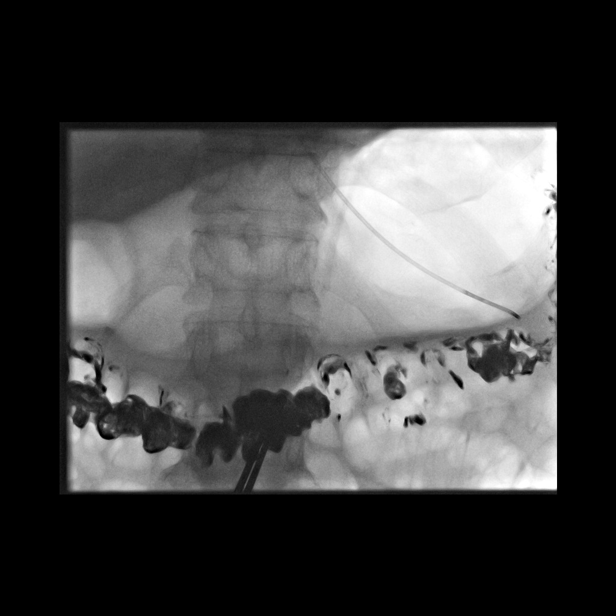
[im 3/12]
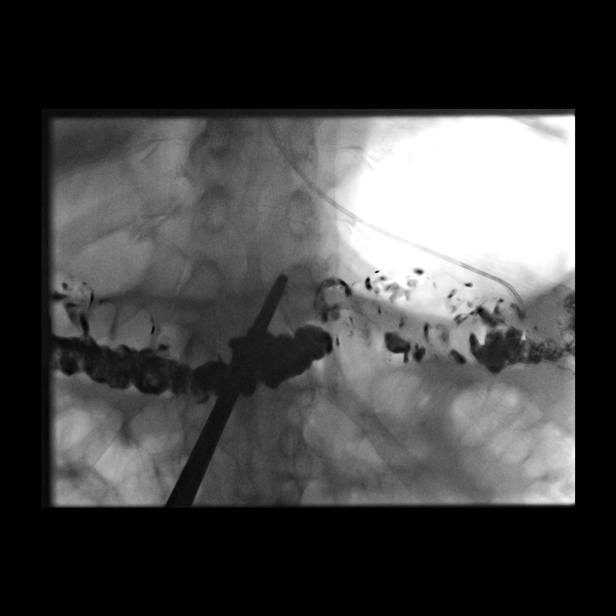
[im 4/12]
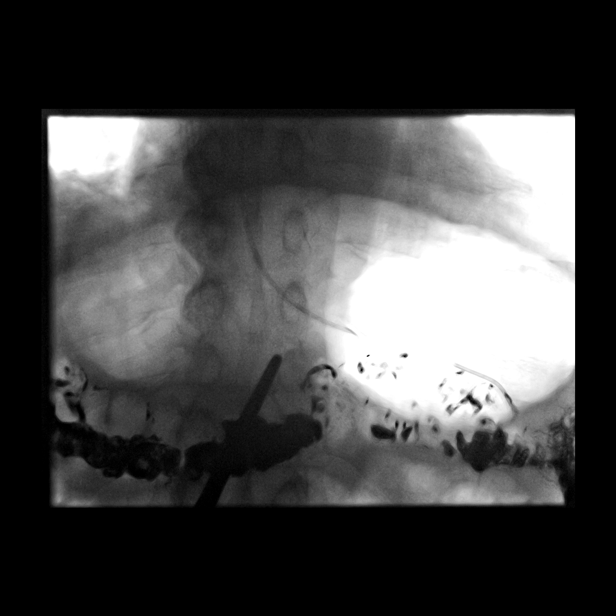
[im 5/12]
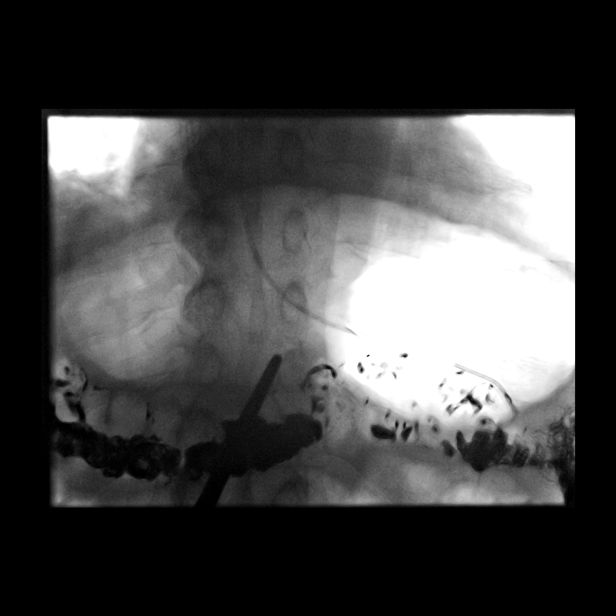
[im 6/12]
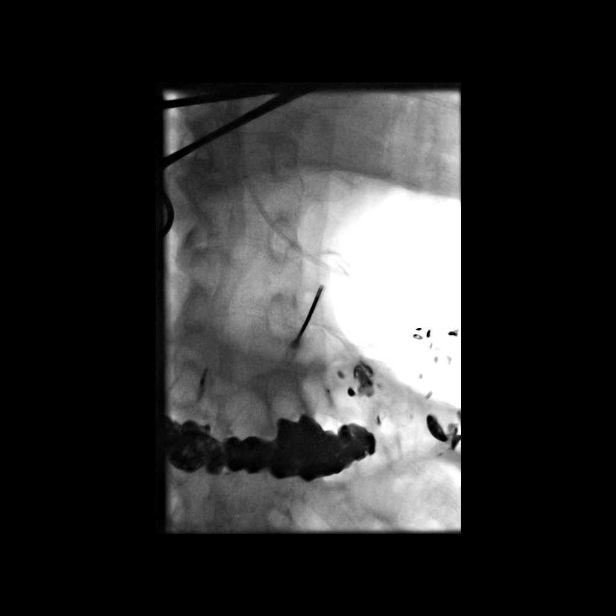
[im 7/12]
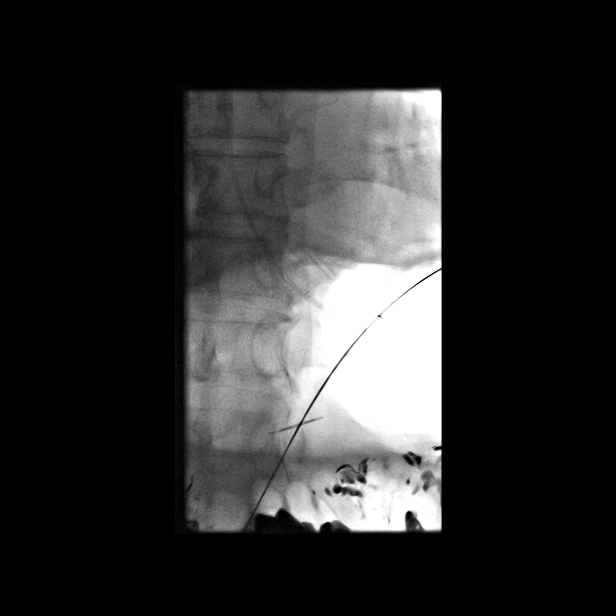
[im 8/12]
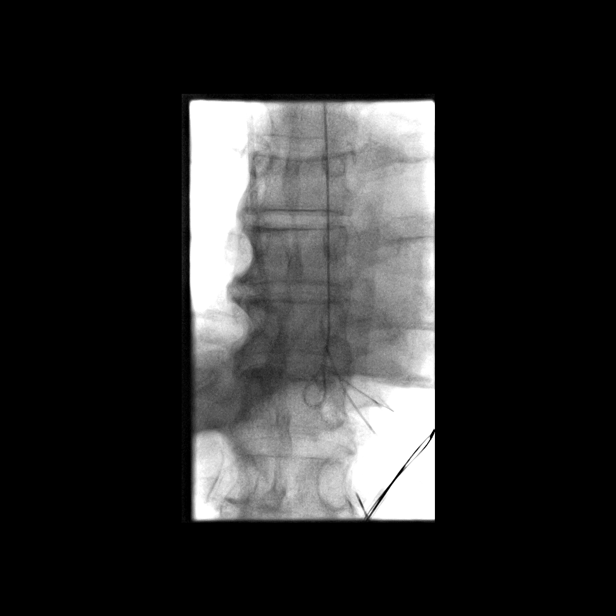
[im 9/12]
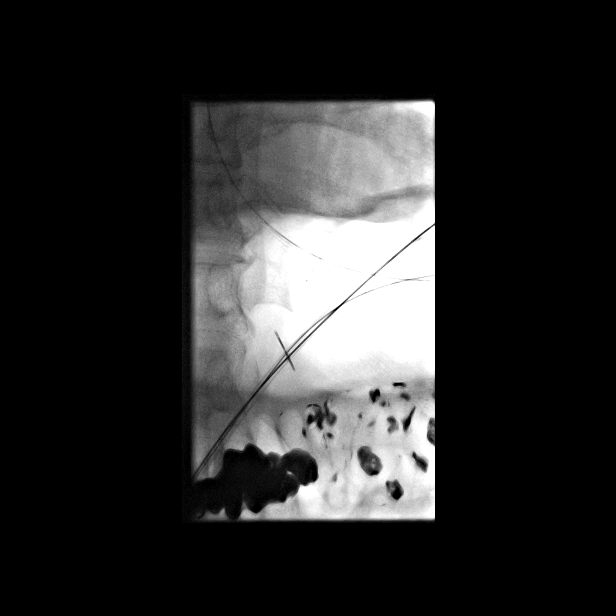
[im 10/12]
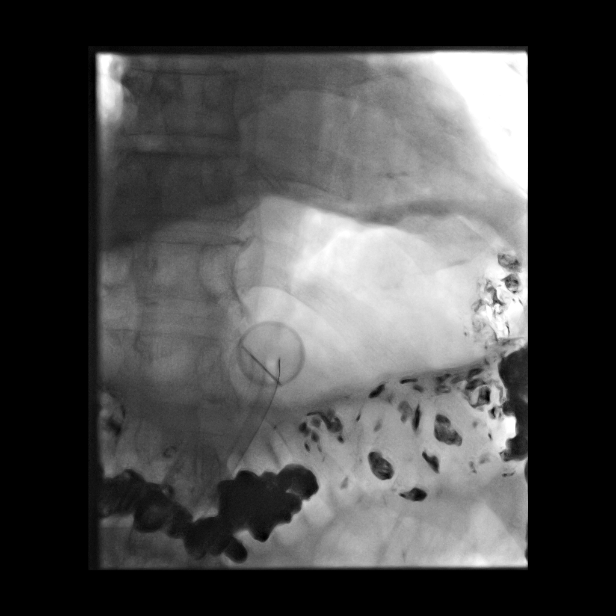
[im 11/12]
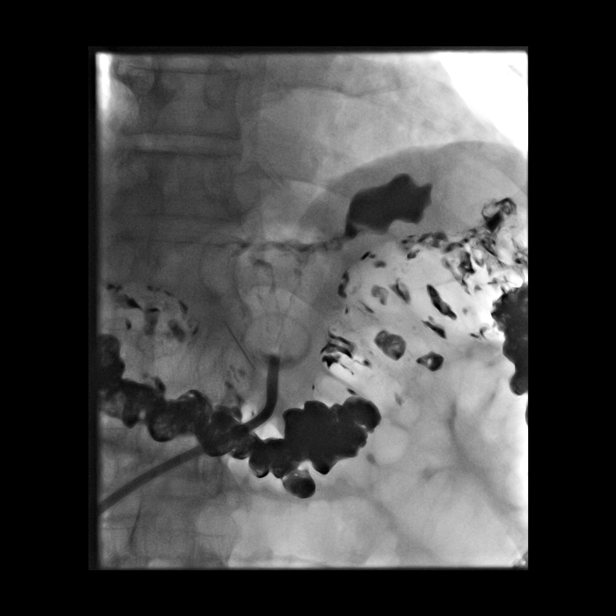
[im 12/12]
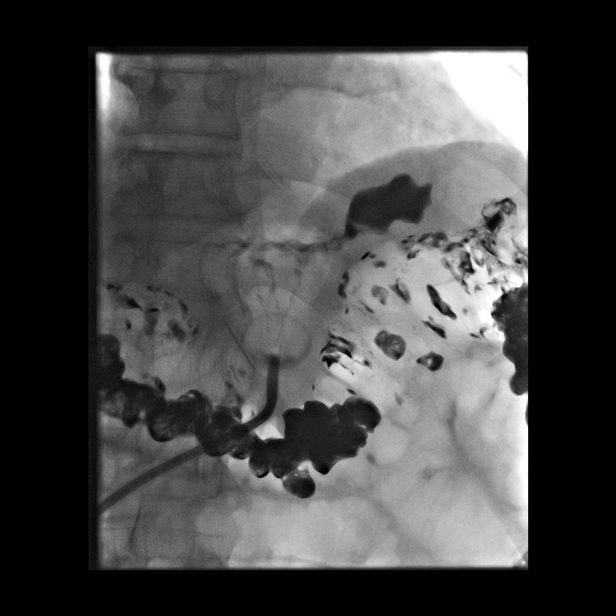

[12 of 12 positions shown; findings below may reference images not displayed]

PROCEDURE(S): PERCUTANEOUS GASTROSTOMY TUBE WITH FLUOROSCOPIC
GUIDANCE

Medications:Versed 2.00mg, Fentanyl 100 mcg, Ancef 1 gram

As antibiotic prophylaxis, Ancef  was ordered pre-procedure and
administered intravenously within one hour of incision.

Moderate sedation time:20 minutes

Fluoroscopy time: 4.9 minutes

Procedure:Informed consent was obtained for a percutaneous
gastrostomy tube.  The patient was placed on the interventional
table.  Fluoroscopy demonstrated oral contrast in the transverse
colon.  An orogastric tube was placed with fluoroscopic guidance.
The anterior abdomen was prepped and draped in sterile fashion.
Maximal barrier sterile technique was utilized including caps,
mask, sterile gowns, sterile gloves, sterile drape, hand hygiene
and skin antiseptic.  Stomach was inflated with air through the
orogastric tube.  The skin and subcutaneous tissues were
anesthetized with 1% lidocaine.  A 17 gauge needle was directed
into the distended stomach with fluoroscopic guidance.  A wire was
advanced into the stomach and Savy Joy was deployed.  A 9-French
vascular sheath was placed and the orogastric tube was snared using
a Gooseneck snare device.  The orogastric tube and snare were
pulled out of the patient's mouth.  The snare device was connected
to a 20-French gastrostomy tube.  The snare device and gastrostomy
tube were pulled through the patient's mouth and out the anterior
abdominal wall.  The gastrostomy tube was cut to an appropriate
length.  Contrast injection through gastrostomy tube confirmed
placement within the stomach.  Fluoroscopic images were obtained
for documentation.  The gastrostomy tube was flushed with normal
saline.
FINDINGS: Gastrostomy tube within the stomach.

Complications: None
IMPRESSION: Successful fluoroscopic guided percutaneous gastrostomy
tube placement.

## 2012-09-29 ENCOUNTER — Telehealth: Payer: Self-pay | Admitting: *Deleted

## 2012-09-29 NOTE — Telephone Encounter (Signed)
Pt left VM states his CT scan has not been scheduled yet.  Called Carrie in [place] [facility] and she will get scans scheduled for pt and call him w/ date/time. They are not due until the end of August.

## 2012-09-29 NOTE — Telephone Encounter (Signed)
Per [employer], [place],  Pt has Zihl[place] and so CT scan of Chest and neck need to be scheduled w/ [facility].  POF sent to our scheduling dept to get that scheduled.

## 2012-10-01 ENCOUNTER — Telehealth: Payer: Self-pay | Admitting: Oncology

## 2012-10-01 NOTE — Telephone Encounter (Signed)
, °

## 2012-11-20 ENCOUNTER — Ambulatory Visit: Payer: PRIVATE HEALTH INSURANCE | Admitting: Radiation Oncology

## 2012-11-20 ENCOUNTER — Other Ambulatory Visit: Payer: Self-pay | Admitting: Lab

## 2012-11-23 ENCOUNTER — Other Ambulatory Visit: Payer: Self-pay

## 2012-11-25 ENCOUNTER — Ambulatory Visit: Payer: Self-pay

## 2012-11-25 ENCOUNTER — Telehealth: Payer: Self-pay | Admitting: *Deleted

## 2012-11-25 NOTE — Telephone Encounter (Signed)
LVM on patient's home and cell phone with request for him to call.  I want to address concerns he might have, potentially reflected by recently cancelled and missed appointments.   Young Berry, RN, BSN, [facility] Navigator [phone]

## 2012-11-27 ENCOUNTER — Telehealth: Payer: Self-pay | Admitting: *Deleted

## 2012-11-27 NOTE — Telephone Encounter (Signed)
LVM on cell and home phone with call back request.  Young Berry, RN, BSN, [facility] Navigator [phone]

## 2012-12-01 ENCOUNTER — Telehealth: Payer: Self-pay | Admitting: *Deleted

## 2012-12-01 NOTE — Telephone Encounter (Signed)
LVM on cell phone (home phone mailbox full) requesting call back re: his plan for future treatments and follow-up with Dr. Basilio Cairo.  Young Berry, RN, BSN, [facility] Navigator [phone]

## 2012-12-12 ENCOUNTER — Telehealth: Payer: Self-pay | Admitting: *Deleted

## 2012-12-12 NOTE — Telephone Encounter (Signed)
Reached patient at home after several previous attempts.  Expressed Dr. Colletta [place] concern that he had cancelled his 11/17/12 appt.  Upon inquiry, he indicated 1) he is not being seen by another provider, and 2) his reason for cancellation is that his current health insurance does not cover specialists.  I expressed appreciation for the information and that I would relay it to Dr. Basilio Cairo.  (Will do via In Basket).  Am sharing information with Kathrin Penner, LCSW.  Young Berry, RN, BSN, [facility] Navigator [phone]

## 2012-12-14 ENCOUNTER — Encounter: Payer: Self-pay | Admitting: Radiation Oncology

## 2012-12-14 NOTE — Progress Notes (Signed)
Rec'd in-basket message from Dr. Basilio Cairo concerning patient not coming for follow up and concern about insurance not covering follow up visit.  Spoke to [place] at [employer] - radiation is a covered service and currently patient has met the $5,000 deductible and $7,500 OOP max for the plan year which starts over 12/30/2012.  I emailed her and copied Dierdre Searles, the H&N navigator to let them know that if they can get the patient to come in prior to 12/30/12, his services should be covered at 100% per Judeth Cornfield.  Ref #[id number]

## 2012-12-16 ENCOUNTER — Encounter: Payer: Self-pay | Admitting: Radiation Oncology

## 2012-12-16 NOTE — Progress Notes (Signed)
Rec'd email back from Dr. Basilio Cairo to call patient and inform him of the coverage of 100% if he comes in for a follow up prior to 12/30/12.  LM for patient at 12:15pm to call our office and schedule a follow up.

## 2013-01-20 ENCOUNTER — Ambulatory Visit (INDEPENDENT_AMBULATORY_CARE_PROVIDER_SITE_OTHER): Payer: BC Managed Care – PPO | Admitting: Family Medicine

## 2013-01-20 ENCOUNTER — Encounter: Payer: Self-pay | Admitting: Family Medicine

## 2013-01-20 VITALS — BP 105/68 | HR 49 | Temp 97.7°F | Ht 68.0 in | Wt 139.2 lb

## 2013-01-20 DIAGNOSIS — Z0000 Encounter for general adult medical examination without abnormal findings: Secondary | ICD-10-CM

## 2013-01-20 LAB — POCT CBC
Granulocyte percent: 78.6 %G (ref 37–80)
HCT, POC: 41.2 % — AB (ref 43.5–53.7)
Hemoglobin: 13.9 g/dL — AB (ref 14.1–18.1)
Lymph, poc: 1 (ref 0.6–3.4)
MCH, POC: 31.1 pg (ref 27–31.2)
MCHC: 33.8 g/dL (ref 31.8–35.4)
MCV: 92.1 fL (ref 80–97)
MPV: 7.2 fL (ref 0–99.8)
POC Granulocyte: 4.9 (ref 2–6.9)
POC LYMPH PERCENT: 16.3 %L (ref 10–50)
Platelet Count, POC: 254 10*3/uL (ref 142–424)
RBC: 4.5 M/uL — AB (ref 4.69–6.13)
RDW, POC: 14.3 %
WBC: 6.2 10*3/uL (ref 4.6–10.2)

## 2013-01-20 NOTE — Progress Notes (Signed)
  Subjective:    Patient ID: Shane Marsh, male    DOB: 1951/01/26, 62 y.o.   MRN: [id number]  HPI This 62 y.o. male presents for evaluation of CPE.  He has not had colonoscopy. He has had Dtap 2006.  He denies any acute medical problems.  He wears glasses For defective visual acuity and has recent eye exam.  .   Review of Systems    No chest pain, SOB, HA, dizziness, vision change, N/V, diarrhea, constipation, dysuria, urinary urgency or frequency, myalgias, arthralgias or rash.  Objective:   Physical Exam  Vital signs noted  Well developed well nourished male.  HEENT - Head atraumatic Normocephalic                Eyes - PERRLA, Conjuctiva - clear Sclera- Clear EOMI                Ears - EAC's Wnl TM's Wnl Gross Hearing WNL                Nose - Nares patent                 Throat - oropharanx wnl Respiratory - Lungs CTA bilateral Cardiac - RRR S1 and S2 without murmur GI - Abdomen soft Nontender and bowel sounds active x 4 Rectal - Declines Extremities - No edema. Neuro - Grossly intact.      Assessment & Plan:  Routine general medical examination at a health care facility - Plan: Ambulatory referral to Gastroenterology, POCT CBC, CMP14+EGFR, Lipid panel, PSA, total and free, Thyroid Panel With TSH Discussed with patient he should take asa 81mg  po qd otc and take MVI po qd otc.  Follow up annually for exam.  Get eye exam annually, and see dentist annually.  Deatra Canter FNP

## 2013-01-20 NOTE — Patient Instructions (Signed)
Colonoscopy  A colonoscopy is an exam to evaluate your entire colon. In this exam, your colon is cleansed. A long fiberoptic tube is inserted through your rectum and into your colon. The fiberoptic scope (endoscope) is a long bundle of enclosed and very flexible fibers. These fibers transmit light to the area examined and send images from that area to your caregiver. Discomfort is usually minimal. You may be given a drug to help you sleep (sedative) during or prior to the procedure. This exam helps to detect lumps (tumors), polyps, inflammation, and areas of bleeding. Your caregiver may also take a small piece of tissue (biopsy) that will be examined under a microscope.  LET YOUR CAREGIVER KNOW ABOUT:   · Allergies to food or medicine.  · Medicines taken, including vitamins, herbs, eyedrops, over-the-counter medicines, and creams.  · Use of steroids (by mouth or creams).  · Previous problems with anesthetics or numbing medicines.  · History of bleeding problems or blood clots.  · Previous surgery.  · Other health problems, including diabetes and kidney problems.  · Possibility of pregnancy, if this applies.  BEFORE THE PROCEDURE   · A clear liquid diet may be required for 2 days before the exam.  · Ask your caregiver about changing or stopping your regular medications.  · Liquid injections (enemas) or laxatives may be required.  · A large amount of electrolyte solution may be given to you to drink over a short period of time. This solution is used to clean out your colon.  · You should be present 60 minutes prior to your procedure or as directed by your caregiver.  AFTER THE PROCEDURE   · If you received a sedative or pain relieving medication, you will need to arrange for someone to drive you home.  · Occasionally, there is a little blood passed with the first bowel movement. Do not be concerned.  FINDING OUT THE RESULTS OF YOUR TEST  Not all test results are available during your visit. If your test results are  not back during the visit, make an appointment with your caregiver to find out the results. Do not assume everything is normal if you have not heard from your caregiver or the medical facility. It is important for you to follow up on all of your test results.  HOME CARE INSTRUCTIONS   · It is not unusual to pass moderate amounts of gas and experience mild abdominal cramping following the procedure. This is due to air being used to inflate your colon during the exam. Walking or a warm pack on your belly (abdomen) may help.  · You may resume all normal meals and activities after sedatives and medicines have worn off.  · Only take over-the-counter or prescription medicines for pain, discomfort, or fever as directed by your caregiver. Do not use aspirin or blood thinners if a biopsy was taken. Consult your caregiver for medicine usage if biopsies were taken.  SEEK IMMEDIATE MEDICAL CARE IF:   · You have a fever.  · You pass large blood clots or fill a toilet with blood following the procedure. This may also occur 10 to 14 days following the procedure. This is more likely if a biopsy was taken.  · You develop abdominal pain that keeps getting worse and cannot be relieved with medicine.  Document Released: 03/15/2000 Document Revised: 06/10/2011 Document Reviewed: 10/29/2007  Exit[employer]® Patient Information ©2014 [employer].

## 2013-01-21 LAB — PSA, TOTAL AND FREE
PSA, Free Pct: 36.7 %
PSA, Free: 0.11 ng/mL
PSA: 0.3 ng/mL (ref 0.0–4.0)

## 2013-01-21 LAB — THYROID PANEL WITH TSH
Free Thyroxine Index: 1.7 (ref 1.2–4.9)
T3 Uptake Ratio: 25 % (ref 24–39)
T4, Total: 6.9 ug/dL (ref 4.5–12.0)
TSH: 3.51 u[IU]/mL (ref 0.450–4.500)

## 2013-01-21 LAB — CMP14+EGFR
ALT: 13 IU/L (ref 0–44)
AST: 17 IU/L (ref 0–40)
Albumin/Globulin Ratio: 1.9 (ref 1.1–2.5)
Albumin: 4.1 g/dL (ref 3.6–4.8)
Alkaline Phosphatase: 45 IU/L (ref 39–117)
BUN/Creatinine Ratio: 16 (ref 10–22)
BUN: 13 mg/dL (ref 8–27)
CO2: 29 mmol/L (ref 18–29)
Calcium: 9.5 mg/dL (ref 8.6–10.2)
Chloride: 102 mmol/L (ref 97–108)
Creatinine, Ser: 0.8 mg/dL (ref 0.76–1.27)
GFR calc Af Amer: 111 mL/min/{1.73_m2} (ref >59)
GFR calc non Af Amer: 96 mL/min/{1.73_m2} (ref >59)
Globulin, Total: 2.2 g/dL (ref 1.5–4.5)
Glucose: 89 mg/dL (ref 65–99)
Potassium: 4.6 mmol/L (ref 3.5–5.2)
Sodium: 141 mmol/L (ref 134–144)
Total Bilirubin: 0.7 mg/dL (ref 0.0–1.2)
Total Protein: 6.3 g/dL (ref 6.0–8.5)

## 2013-01-21 LAB — LIPID PANEL
Chol/HDL Ratio: 3.4 ratio units (ref 0.0–5.0)
Cholesterol, Total: 215 mg/dL — ABNORMAL HIGH (ref 100–199)
HDL: 63 mg/dL (ref >39)
LDL Calculated: 136 mg/dL — ABNORMAL HIGH (ref 0–99)
Triglycerides: 79 mg/dL (ref 0–149)
VLDL Cholesterol Cal: 16 mg/dL (ref 5–40)

## 2013-02-01 ENCOUNTER — Telehealth: Payer: Self-pay | Admitting: Family Medicine

## 2013-02-04 ENCOUNTER — Other Ambulatory Visit: Payer: Self-pay

## 2013-02-05 NOTE — Telephone Encounter (Signed)
Reviewed pt's labs with wife Verbalizes understanding

## 2013-02-24 ENCOUNTER — Encounter: Payer: Self-pay | Admitting: Gastroenterology

## 2013-04-18 IMAGING — CT NM PET TUM IMG RESTAG (PS) SKULL BASE T - THIGH
4 series · 25 of 25 positions shown · non-contrast
Comparison: 10/23/2011

CLINICAL DATA: Subsequent treatment strategy for restaging of
tonsillar cancer..

NUCLEAR MEDICINE PET SKULL BASE TO THIGH
Fasting Blood Glucose:  89
TECHNIQUE: 18.6 mCi F-18 FDG was injected intravenously. CT data
was obtained and used for attenuation correction and anatomic
localization only.  (This was not acquired as a diagnostic CT
examination.) Additional exam technical data entered on
technologist worksheet.

[Series 2: ct neck · axial · 3.8mm · 0.98mm/px · z∈[-294,+0]mm · 8 of 91 slices shown]
[im 1/91]
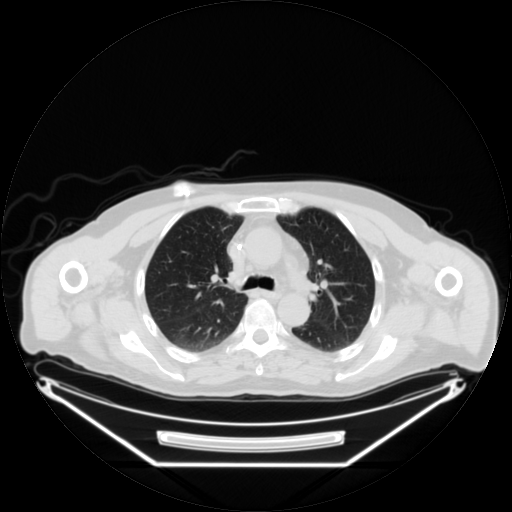
[im 13/91]
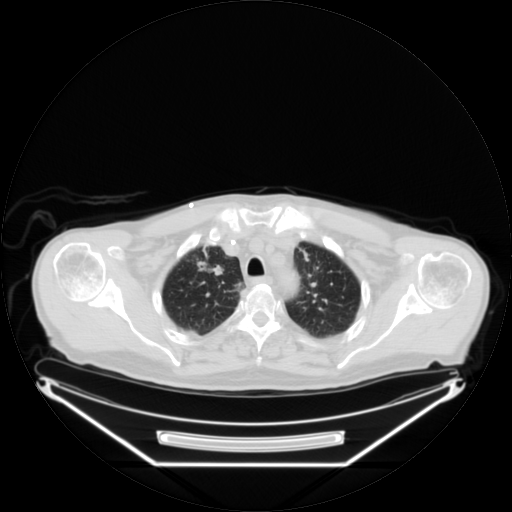
[im 26/91]
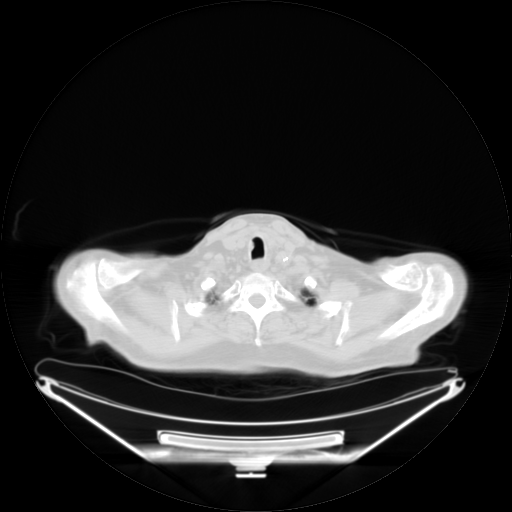
[im 39/91]
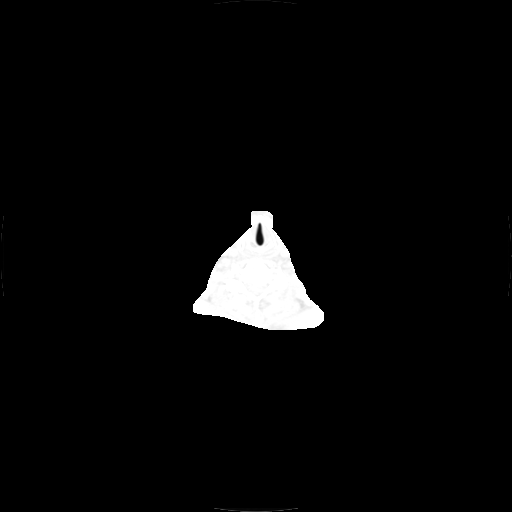
[im 52/91]
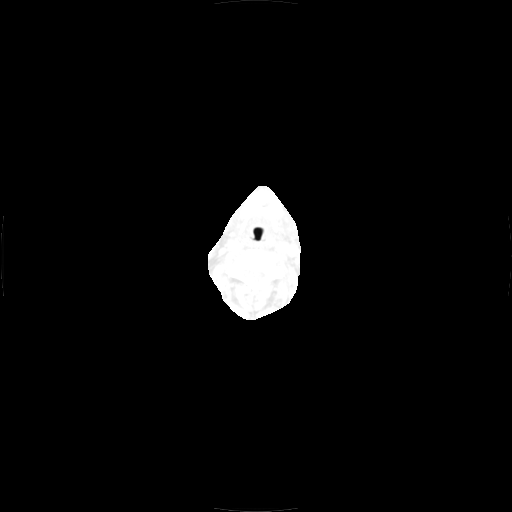
[im 65/91]
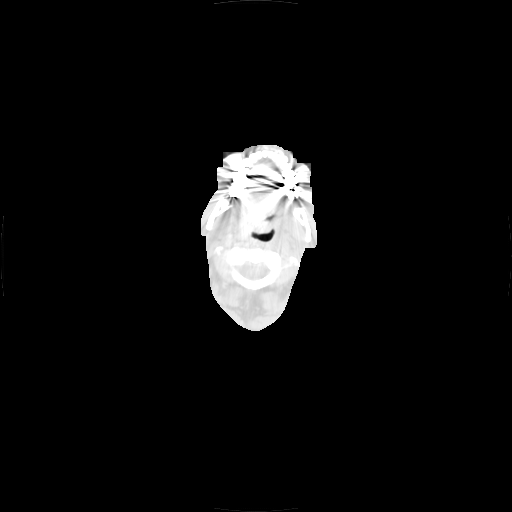
[im 78/91]
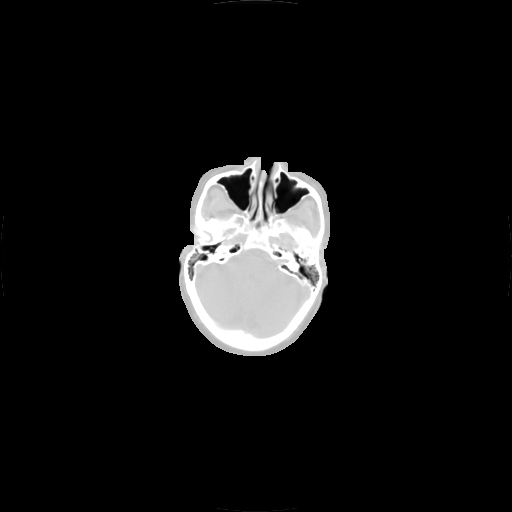
[im 91/91  brain]
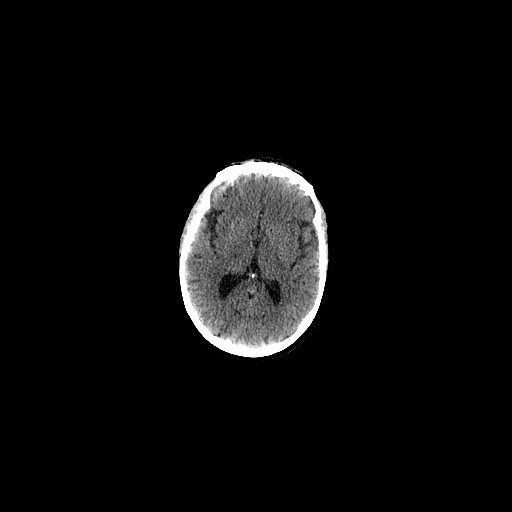

[Series 123: mip · coronal · 3.3mm · 4.69mm/px · 3 of 30 slices shown]
[im 1/30]
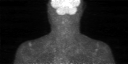
[im 15/30]
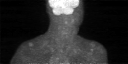
[im 30/30]
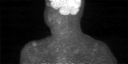

[Series 151: reformatted · axial · 3.3mm · 3.91mm/px · z∈[-294,+0]mm · 8 of 90 slices shown (1 of 2)]
[im 1/90]
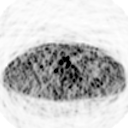
[im 13/90]
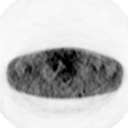
[im 26/90]
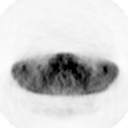
[im 39/90]
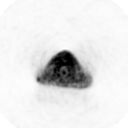
[im 51/90]
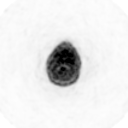
[im 64/90]
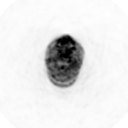
[im 77/90]
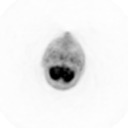
[im 90/90]
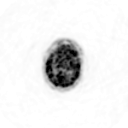

[Series 153: reformatted · coronal · 4.7mm · 4.69mm/px · 6 of 72 slices shown (2 of 2)]
[im 1/72]
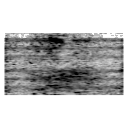
[im 15/72]
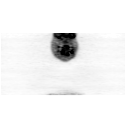
[im 29/72]
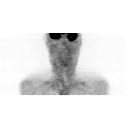
[im 43/72]
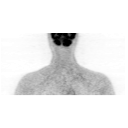
[im 57/72]
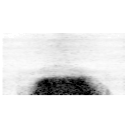
[im 72/72]
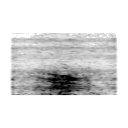

[25 of 25 positions shown; findings below may reference images not displayed]

FINDINGS: Neck: A focus of hypermetabolism involving the left-sided tongue
base, measuring a S.U.V. max of 6.4.  No CT correlate.

Redemonstration of low level hypermetabolism at the left side of
the parapharyngeal space.  This measures a S.U.V. max of 3.6,
including on image 23/series 2. This measured a S.U.V. max of
on the prior. No well-defined mass in this area.

No hypermetabolic cervical nodes.

Chest:  Mild hypermetabolism corresponding to left apical pleural
parenchymal opacity which is most consistent with scarring, likely
radiation induced.  New since 10/23/2011.  Example image 65/series
2.

Abdomen/Pelvis:  No abnormal hypermetabolism.

Skeleton:  Muscular activity cephalad the left hip is likely due to
muscular strain.

CT  images performed for attenuation correction demonstrate no
further findings within the neck.

Surgical clips at the left lower neck.

Right-sided Port-A-Cath which terminates at the cavoatrial
junction.  Mild cardiomegaly with coronary artery atherosclerosis.
Mild right apical pleural parenchymal scarring, also new in the
interval.  Resolved right upper lobe infectious/inflammatory
nodularity since the prior PET.  Tiny right renal lesion which is
likely a cyst.  Gastrostomy tube which terminates at the body of
the stomach.  Left common iliac artery aneurysm at 2.5 cm, similar
to the prior. A sebaceous cyst about the right paraspinous region
of the lower chest, unchanged at 4.2 cm.
IMPRESSION: 1. No evidence of hypermetabolic residual or recurrent head neck
primary.  Low level hypermetabolism at the site of treated
parapharyngeal primary is again identified without well-defined
recurrent mass.
2.  Developing radiation fibrosis at the apices, greater left than
right.
3. Similar left common iliac artery aneurysm.
4.  Left tongue base hypermetabolism, favored to be physiologic.
This could be reevaluated at follow-up or directly visualized.

## 2013-04-23 ENCOUNTER — Other Ambulatory Visit: Payer: Self-pay | Admitting: Gastroenterology

## 2013-07-31 ENCOUNTER — Ambulatory Visit (INDEPENDENT_AMBULATORY_CARE_PROVIDER_SITE_OTHER): Payer: BC Managed Care – PPO | Admitting: General Practice

## 2013-07-31 ENCOUNTER — Encounter: Payer: Self-pay | Admitting: General Practice

## 2013-07-31 VITALS — BP 94/63 | HR 64 | Temp 99.9°F | Ht 68.0 in | Wt 144.0 lb

## 2013-07-31 DIAGNOSIS — J111 Influenza due to unidentified influenza virus with other respiratory manifestations: Secondary | ICD-10-CM

## 2013-07-31 DIAGNOSIS — R52 Pain, unspecified: Secondary | ICD-10-CM

## 2013-07-31 DIAGNOSIS — J029 Acute pharyngitis, unspecified: Secondary | ICD-10-CM

## 2013-07-31 DIAGNOSIS — J101 Influenza due to other identified influenza virus with other respiratory manifestations: Secondary | ICD-10-CM

## 2013-07-31 LAB — POCT INFLUENZA A/B
INFLUENZA A, POC: NEGATIVE
INFLUENZA B, POC: POSITIVE

## 2013-07-31 LAB — POCT RAPID STREP A (OFFICE): RAPID STREP A SCREEN: NEGATIVE

## 2013-07-31 MED ORDER — OSELTAMIVIR PHOSPHATE 75 MG PO CAPS: 75.0000 mg | ORAL_CAPSULE | Freq: Two times a day (BID) | ORAL | Status: DC

## 2013-07-31 NOTE — Progress Notes (Signed)
   Subjective:    Patient ID: Shane Marsh, male    DOB: [birth year], 63 y.o.   MRN: [id number]  Fever  This is a new problem. The current episode started yesterday. The problem occurs daily. The problem has been unchanged. His temperature was unmeasured prior to arrival. Associated symptoms include muscle aches, nausea and a sore throat. Pertinent negatives include no chest pain, congestion, coughing, diarrhea, ear pain, rash or vomiting. He has tried nothing for the symptoms.      Review of Systems  Constitutional: Positive for fever and chills.  HENT: Positive for sneezing and sore throat. Negative for congestion, ear pain, postnasal drip and rhinorrhea.   Respiratory: Negative for cough and chest tightness.   Cardiovascular: Negative for chest pain and palpitations.  Gastrointestinal: Positive for nausea. Negative for vomiting and diarrhea.  Skin: Negative for rash.       Objective:   Physical Exam  Constitutional: He is oriented to person, place, and time. He appears well-developed and well-nourished.  HENT:  Head: Normocephalic and atraumatic.  Right Ear: External ear normal.  Left Ear: External ear normal.  Mouth/Throat: Posterior oropharyngeal erythema present.  Cardiovascular: Normal rate, regular rhythm and normal heart sounds.   Pulmonary/Chest: Effort normal and breath sounds normal. No respiratory distress. He exhibits no tenderness.  Neurological: He is alert and oriented to person, place, and time.  Skin: Skin is warm and dry.  Psychiatric: He has a normal mood and affect.      Results for orders placed in visit on 07/31/13  POCT RAPID STREP A (OFFICE)      Result Value Ref Range   Rapid Strep A Screen Negative  Negative  POCT INFLUENZA A/B      Result Value Ref Range   Influenza A, POC Negative     Influenza B, POC Positive         Assessment & Plan:  1. Sore throat  - POCT rapid strep A -RTO if worsens or unresolved in 1 week, may need further testing  (hx of nasopharyngeal cancer) 2. Body aches  - POCT Influenza A/B  3. Influenza B  - oseltamivir (TAMIFLU) 75 MG capsule; Take 1 capsule (75 mg total) by mouth 2 (two) times daily.  Dispense: 10 capsule; Refill: 0 -home care instructions provided and discussed -RTO if symptoms worsen or unresolved -may seek emergency medical treatment -Patient and wife verbalized understanding Erby Pian, FNP-C

## 2013-07-31 NOTE — Patient Instructions (Signed)

## 2014-01-26 ENCOUNTER — Ambulatory Visit (INDEPENDENT_AMBULATORY_CARE_PROVIDER_SITE_OTHER): Payer: BC Managed Care – PPO | Admitting: Family Medicine

## 2014-01-26 ENCOUNTER — Encounter: Payer: Self-pay | Admitting: Family Medicine

## 2014-01-26 VITALS — BP 93/57 | HR 61 | Temp 98.5°F | Ht 68.0 in | Wt 151.2 lb

## 2014-01-26 DIAGNOSIS — Z0000 Encounter for general adult medical examination without abnormal findings: Secondary | ICD-10-CM

## 2014-01-26 DIAGNOSIS — E785 Hyperlipidemia, unspecified: Secondary | ICD-10-CM

## 2014-01-26 LAB — POCT CBC
Granulocyte percent: 80.3 %G — AB (ref 37–80)
HCT, POC: 43.1 % — AB (ref 43.5–53.7)
Hemoglobin: 14.2 g/dL (ref 14.1–18.1)
Lymph, poc: 1.1 (ref 0.6–3.4)
MCH, POC: 31.3 pg — AB (ref 27–31.2)
MCHC: 32.9 g/dL (ref 31.8–35.4)
MCV: 95 fL (ref 80–97)
MPV: 8.1 fL (ref 0–99.8)
POC Granulocyte: 6.1 (ref 2–6.9)
POC LYMPH PERCENT: 14.6 %L (ref 10–50)
Platelet Count, POC: 224 10*3/uL (ref 142–424)
RBC: 4.5 M/uL — AB (ref 4.69–6.13)
RDW, POC: 14.1 %
WBC: 7.6 10*3/uL (ref 4.6–10.2)

## 2014-01-26 NOTE — Progress Notes (Signed)
   Subjective:    Patient ID: Shane Marsh, male    DOB: 01-Jul-1950, 63 y.o.   MRN: [id number]  HPI  This 63 y.o. male presents for evaluation of CPE.  He has hx of nasopharyngeal carcinoma and had to have surgery, chemo, and radiation. He had this 2 years ago.  He no longer is seeing Oncology.  He has hx of bilateral DVT's during this time and had 6 months of coumadin and has not had recurrence of DVT.  Review of Systems No chest pain, SOB, HA, dizziness, vision change, N/V, diarrhea, constipation, dysuria, urinary urgency or frequency, myalgias, arthralgias or rash.     Objective:   Physical Exam Vital signs noted  Well developed well nourished male.  HEENT - Head atraumatic Normocephalic                Eyes - PERRLA, Conjuctiva - clear Sclera- Clear EOMI                Ears - EAC's Wnl TM's Wnl Gross Hearing WNL                Nose - Nares patent                 Throat - oropharanx wnl Respiratory - Lungs CTA bilateral Cardiac - RRR S1 and S2 without murmur GI - Abdomen soft Nontender and bowel sounds active x 4 Extremities - No edema. Neuro - Grossly intact.       Assessment & Plan:  Hyperlipemia - Plan: Lipid panel  Wellness examination - Plan: POCT CBC, CMP14+EGFR, Lipid panel, PSA, total and free, Thyroid Panel With TSH  Discussed getting a pneumonia vaccination due to not having spleen and he wants to wait. He states he is utd with his flu shot.  He wants to wait on colonoscopy and discussed getting one and he will call if changes mind.  Recommend follow up with Oncology for surveillance imaging on head and neck and he states his insurance is making this too expensive and wants to wait.  Lysbeth Penner FNP

## 2014-01-27 LAB — THYROID PANEL WITH TSH
Free Thyroxine Index: 1.3 (ref 1.2–4.9)
T3 Uptake Ratio: 24 % (ref 24–39)
T4, Total: 5.3 ug/dL (ref 4.5–12.0)
TSH: 6.05 u[IU]/mL — ABNORMAL HIGH (ref 0.450–4.500)

## 2014-01-27 LAB — PSA, TOTAL AND FREE
PSA, Free Pct: 32.5 %
PSA, Free: 0.13 ng/mL
PSA: 0.4 ng/mL (ref 0.0–4.0)

## 2014-01-27 LAB — CMP14+EGFR
ALT: 12 IU/L (ref 0–44)
AST: 19 IU/L (ref 0–40)
Albumin/Globulin Ratio: 1.3 (ref 1.1–2.5)
Albumin: 3.9 g/dL (ref 3.6–4.8)
Alkaline Phosphatase: 56 IU/L (ref 39–117)
BUN/Creatinine Ratio: 15 (ref 10–22)
BUN: 14 mg/dL (ref 8–27)
CO2: 26 mmol/L (ref 18–29)
Calcium: 9.5 mg/dL (ref 8.6–10.2)
Chloride: 99 mmol/L (ref 97–108)
Creatinine, Ser: 0.94 mg/dL (ref 0.76–1.27)
GFR calc Af Amer: 100 mL/min/{1.73_m2} (ref >59)
GFR calc non Af Amer: 87 mL/min/{1.73_m2} (ref >59)
Globulin, Total: 2.9 g/dL (ref 1.5–4.5)
Glucose: 85 mg/dL (ref 65–99)
Potassium: 5.2 mmol/L (ref 3.5–5.2)
Sodium: 138 mmol/L (ref 134–144)
Total Bilirubin: 0.8 mg/dL (ref 0.0–1.2)
Total Protein: 6.8 g/dL (ref 6.0–8.5)

## 2014-01-27 LAB — LIPID PANEL
Chol/HDL Ratio: 3.7 ratio units (ref 0.0–5.0)
Cholesterol, Total: 232 mg/dL — ABNORMAL HIGH (ref 100–199)
HDL: 63 mg/dL (ref >39)
LDL Calculated: 156 mg/dL — ABNORMAL HIGH (ref 0–99)
Triglycerides: 67 mg/dL (ref 0–149)
VLDL Cholesterol Cal: 13 mg/dL (ref 5–40)

## 2014-01-31 ENCOUNTER — Other Ambulatory Visit: Payer: Self-pay | Admitting: Family Medicine

## 2014-01-31 MED ORDER — LEVOTHYROXINE SODIUM 25 MCG PO TABS: 25.0000 ug | ORAL_TABLET | Freq: Every day | ORAL | Status: AC

## 2014-01-31 MED ORDER — PRAVASTATIN SODIUM 40 MG PO TABS: 40.0000 mg | ORAL_TABLET | Freq: Every day | ORAL | Status: AC

## 2014-02-02 ENCOUNTER — Telehealth: Payer: Self-pay | Admitting: *Deleted

## 2014-02-02 NOTE — Telephone Encounter (Signed)
-----   Message from Lysbeth Penner, FNP sent at 01/31/2014 10:09 AM EST ----- Lipids elevated and recommend rx of pravachol 40mg  po qd.  TSH elevated and hypothyroid and rx of levothyroxine sent to pharm along with pravachol F/u TSH, cmp, and FLP in 2 months

## 2014-02-10 ENCOUNTER — Encounter: Payer: Self-pay | Admitting: Family Medicine

## 2014-02-10 ENCOUNTER — Ambulatory Visit (INDEPENDENT_AMBULATORY_CARE_PROVIDER_SITE_OTHER): Payer: BC Managed Care – PPO | Admitting: Family Medicine

## 2014-02-10 VITALS — BP 89/54 | HR 62 | Temp 98.5°F | Ht 68.0 in | Wt 152.6 lb

## 2014-02-10 DIAGNOSIS — J069 Acute upper respiratory infection, unspecified: Secondary | ICD-10-CM | POA: Diagnosis not present

## 2014-02-10 MED ORDER — AZITHROMYCIN 250 MG PO TABS: ORAL_TABLET | ORAL | Status: AC

## 2014-02-10 MED ORDER — METHYLPREDNISOLONE ACETATE 80 MG/ML IJ SUSP
80.0000 mg | Freq: Once | INTRAMUSCULAR | Status: AC
  Administered 2014-02-10: 80 mg via INTRAMUSCULAR

## 2014-02-10 NOTE — Progress Notes (Signed)
   Subjective:    Patient ID: Shane Marsh, male    DOB: 1950/09/28, 63 y.o.   MRN: [id number]  HPI Patient c/o uri sx's for over a week.  Review of Systems  Constitutional: Negative for fever.  HENT: Negative for ear pain.   Eyes: Negative for discharge.  Respiratory: Negative for cough.   Cardiovascular: Negative for chest pain.  Gastrointestinal: Negative for abdominal distention.  Endocrine: Negative for polyuria.  Genitourinary: Negative for difficulty urinating.  Musculoskeletal: Negative for gait problem and neck pain.  Skin: Negative for color change and rash.  Neurological: Negative for speech difficulty and headaches.  Psychiatric/Behavioral: Negative for agitation.       Objective:    BP 89/54 mmHg  Pulse 62  Temp(Src) 98.5 F (36.9 C) (Oral)  Ht 5\' 8"  (1.727 m)  Wt 152 lb 9.6 oz (69.219 kg)  BMI 23.21 kg/m2 Physical Exam  Constitutional: He is oriented to person, place, and time. He appears well-developed and well-nourished.  HENT:  Head: Normocephalic and atraumatic.  Mouth/Throat: Oropharynx is clear and moist.  Eyes: Pupils are equal, round, and reactive to light.  Neck: Normal range of motion. Neck supple.  Cardiovascular: Normal rate and regular rhythm.   No murmur heard. Pulmonary/Chest: Effort normal and breath sounds normal.  Abdominal: Soft. Bowel sounds are normal. There is no tenderness.  Neurological: He is alert and oriented to person, place, and time.  Skin: Skin is warm and dry.  Psychiatric: He has a normal mood and affect.          Assessment & Plan:     ICD-9-CM ICD-10-CM   1. URI (upper respiratory infection) 465.9 J06.9 azithromycin (ZITHROMAX) 250 MG tablet     methylPREDNISolone acetate (DEPO-MEDROL) injection 80 mg   Push po fluids, rest, tylenol and motrin otc prn as directed for fever, arthralgias, and myalgias.  Follow up prn if sx's continue or persist.  No Follow-up on file.  Lysbeth Penner FNP

## 2014-05-02 ENCOUNTER — Telehealth: Payer: Self-pay | Admitting: Family Medicine

## 2014-09-30 HISTORY — PX: OTHER SURGICAL HISTORY: SHX169

## 2016-06-24 ENCOUNTER — Ambulatory Visit: Payer: Self-pay | Admitting: Radiation Oncology
